# Patient Record
Sex: Male | Born: 1955 | Race: Black or African American | Hispanic: No | Marital: Single | Smoking: Current every day smoker
Health system: Southern US, Community
[De-identification: ages and names within clinical notes are randomized; demographics above are authoritative.]

## PROBLEM LIST (undated history)

## (undated) DIAGNOSIS — I219 Acute myocardial infarction, unspecified: Secondary | ICD-10-CM

## (undated) DIAGNOSIS — I5020 Unspecified systolic (congestive) heart failure: Secondary | ICD-10-CM

## (undated) DIAGNOSIS — B1910 Unspecified viral hepatitis B without hepatic coma: Secondary | ICD-10-CM

## (undated) DIAGNOSIS — Z720 Tobacco use: Secondary | ICD-10-CM

## (undated) DIAGNOSIS — I509 Heart failure, unspecified: Secondary | ICD-10-CM

## (undated) DIAGNOSIS — F32A Depression, unspecified: Secondary | ICD-10-CM

## (undated) DIAGNOSIS — I2510 Atherosclerotic heart disease of native coronary artery without angina pectoris: Secondary | ICD-10-CM

## (undated) DIAGNOSIS — R519 Headache, unspecified: Secondary | ICD-10-CM

## (undated) DIAGNOSIS — I10 Essential (primary) hypertension: Secondary | ICD-10-CM

## (undated) DIAGNOSIS — R51 Headache: Secondary | ICD-10-CM

## (undated) DIAGNOSIS — J449 Chronic obstructive pulmonary disease, unspecified: Secondary | ICD-10-CM

## (undated) DIAGNOSIS — B1920 Unspecified viral hepatitis C without hepatic coma: Secondary | ICD-10-CM

## (undated) DIAGNOSIS — E785 Hyperlipidemia, unspecified: Secondary | ICD-10-CM

## (undated) DIAGNOSIS — F329 Major depressive disorder, single episode, unspecified: Secondary | ICD-10-CM

## (undated) HISTORY — PX: KNEE SURGERY: SHX244

## (undated) HISTORY — DX: Unspecified viral hepatitis C without hepatic coma: B19.20

## (undated) HISTORY — DX: Acute myocardial infarction, unspecified: I21.9

## (undated) HISTORY — DX: Atherosclerotic heart disease of native coronary artery without angina pectoris: I25.10

## (undated) HISTORY — DX: Tobacco use: Z72.0

## (undated) HISTORY — DX: Unspecified systolic (congestive) heart failure: I50.20

## (undated) HISTORY — DX: Hyperlipidemia, unspecified: E78.5

## (undated) HISTORY — DX: Essential (primary) hypertension: I10

## (undated) HISTORY — DX: Depression, unspecified: F32.A

## (undated) HISTORY — DX: Headache: R51

## (undated) HISTORY — DX: Chronic obstructive pulmonary disease, unspecified: J44.9

## (undated) HISTORY — DX: Major depressive disorder, single episode, unspecified: F32.9

## (undated) HISTORY — DX: Headache, unspecified: R51.9

## (undated) HISTORY — PX: CARDIAC CATHETERIZATION: SHX172

## (undated) HISTORY — DX: Unspecified viral hepatitis B without hepatic coma: B19.10

---

## 2007-01-16 ENCOUNTER — Other Ambulatory Visit: Payer: Self-pay

## 2007-01-16 ENCOUNTER — Emergency Department: Payer: Self-pay

## 2007-07-11 HISTORY — PX: CORONARY ANGIOPLASTY WITH STENT PLACEMENT: SHX49

## 2008-05-05 ENCOUNTER — Ambulatory Visit: Payer: Self-pay | Admitting: Cardiovascular Disease

## 2008-05-30 ENCOUNTER — Emergency Department: Payer: Self-pay | Admitting: Emergency Medicine

## 2010-07-10 HISTORY — PX: CORONARY ARTERY BYPASS GRAFT: SHX141

## 2011-01-26 DIAGNOSIS — F99 Mental disorder, not otherwise specified: Secondary | ICD-10-CM | POA: Insufficient documentation

## 2011-01-26 DIAGNOSIS — B1920 Unspecified viral hepatitis C without hepatic coma: Secondary | ICD-10-CM | POA: Insufficient documentation

## 2011-01-26 DIAGNOSIS — B182 Chronic viral hepatitis C: Secondary | ICD-10-CM | POA: Insufficient documentation

## 2014-09-16 ENCOUNTER — Ambulatory Visit (INDEPENDENT_AMBULATORY_CARE_PROVIDER_SITE_OTHER): Payer: Medicaid Other | Admitting: Cardiovascular Disease

## 2014-09-16 ENCOUNTER — Encounter: Payer: Self-pay | Admitting: Cardiovascular Disease

## 2014-09-16 VITALS — BP 122/80 | HR 70 | Ht 67.0 in | Wt 164.8 lb

## 2014-09-16 DIAGNOSIS — K759 Inflammatory liver disease, unspecified: Secondary | ICD-10-CM | POA: Insufficient documentation

## 2014-09-16 DIAGNOSIS — I5022 Chronic systolic (congestive) heart failure: Secondary | ICD-10-CM | POA: Insufficient documentation

## 2014-09-16 DIAGNOSIS — J439 Emphysema, unspecified: Secondary | ICD-10-CM

## 2014-09-16 DIAGNOSIS — F17200 Nicotine dependence, unspecified, uncomplicated: Secondary | ICD-10-CM | POA: Insufficient documentation

## 2014-09-16 DIAGNOSIS — R0602 Shortness of breath: Secondary | ICD-10-CM | POA: Insufficient documentation

## 2014-09-16 DIAGNOSIS — I25119 Atherosclerotic heart disease of native coronary artery with unspecified angina pectoris: Secondary | ICD-10-CM | POA: Insufficient documentation

## 2014-09-16 DIAGNOSIS — R0789 Other chest pain: Secondary | ICD-10-CM

## 2014-09-16 DIAGNOSIS — Z951 Presence of aortocoronary bypass graft: Secondary | ICD-10-CM | POA: Insufficient documentation

## 2014-09-16 DIAGNOSIS — J432 Centrilobular emphysema: Secondary | ICD-10-CM | POA: Insufficient documentation

## 2014-09-16 DIAGNOSIS — Z720 Tobacco use: Secondary | ICD-10-CM | POA: Insufficient documentation

## 2014-09-16 DIAGNOSIS — I25118 Atherosclerotic heart disease of native coronary artery with other forms of angina pectoris: Secondary | ICD-10-CM | POA: Insufficient documentation

## 2014-09-16 MED ORDER — FUROSEMIDE 20 MG PO TABS: 20.0000 mg | ORAL_TABLET | Freq: Every day | ORAL | Status: DC | PRN

## 2014-09-16 MED ORDER — ALBUTEROL SULFATE HFA 108 (90 BASE) MCG/ACT IN AERS: 2.0000 | INHALATION_SPRAY | RESPIRATORY_TRACT | Status: DC | PRN

## 2014-09-16 NOTE — Assessment & Plan Note (Signed)
Records [facility] from 2012

## 2014-09-16 NOTE — Assessment & Plan Note (Signed)
Recommended smoking cessation. Likely severe COPD given 40+ years of smoking We have given him an inhaler/albuterol to take as needed for shortness of breath symptoms

## 2014-09-16 NOTE — Progress Notes (Signed)
Patient ID: Blake [name], male    DOB: June 21, 1956, 59 y.o.   MRN: [id number]  HPI Comments: Mr. Blake Blake [name] is a 59 year old gentleman with long history of smoking who continues to smoke at least one pack per day, hypertension, hepatitis, schizophrenia, coronary artery disease, cardiac catheterization October 2009 showing ejection fraction 32%, severe anterolateral and severe apical hypokinesis, three-vessel coronary artery disease, bypass surgery after MI in 2012 (details unavailable and have been requested), who presents for worsening shortness of breath. Notes indicate he was in prison for 2 years and had no medications, May 2015 got out of prison.  He reports that he was doing well following bypass surgery in 2012. Approximately 2-3 months ago he developed worsening shortness of breath. He has had some lower extremity edema. Family presents with him today and reports that he is unable to walk very far, has significant wheezing and shortness of breath with any activity. Denies having regular chest pain/anginal symptoms. He does report having occasional chest pain, takes nitroglycerin when necessary. Until recently was not taking any aspirin Recently seen by primary care  Recent blood work reviewed with him showing total cholesterol 126, LDL 82, HDL 33 Normal creatinine 0.92, normal LFTs, PSA 0.7 Hepatitis C virus antibody positive, hepatitis B core antibody positive,  EKG on today's visit shows normal sinus rhythm with rate 70 bpm, old anterior MI, T wave abnormality V3 through V6, 1 and aVL   No Known Allergies  Outpatient Encounter Prescriptions as of 09/16/2014  Medication Sig  . albuterol (PROVENTIL HFA;VENTOLIN HFA) 108 (90 BASE) MCG/ACT inhaler Inhale 2 puffs into the lungs every 4 (four) hours as needed for wheezing or shortness of breath.  Marland Kitchen. aspirin 81 MG chewable tablet Chew 81 mg by mouth daily.   . furosemide (LASIX) 20 MG tablet Take 1 tablet (20 mg total) by mouth  daily as needed.  Marland Kitchen. lisinopril (PRINIVIL,ZESTRIL) 10 MG tablet Take 10 mg by mouth daily.   Marland Kitchen. loratadine (CLARITIN) 10 MG tablet Take 10 mg by mouth daily.   . nitroGLYCERIN (NITROSTAT) 0.4 MG SL tablet Place 0.4 mg under the tongue.     Past Medical History  Diagnosis Date  . Coronary artery disease   . Depression   . Acute headache   . Hypertension   . Hyperlipidemia   . MI (myocardial infarction)     x 2    Past Surgical History  Procedure Laterality Date  . Knee surgery      right knee   . Cardiac catheterization    . Coronary angioplasty with stent placement  2009  . Coronary artery bypass graft  2012    [facility]    Social History  reports that he has been smoking Cigarettes.  He has a 42 pack-year smoking history. He does not have any smokeless tobacco history on file. He reports that he does not drink alcohol or use illicit drugs.  Family History family history includes Hypertension in his father, mother, sister, and sister.   Review of Systems  Constitutional: Negative.   HENT: Negative.   Respiratory: Positive for shortness of breath.   Cardiovascular: Positive for chest pain.  Gastrointestinal: Negative.   Musculoskeletal: Negative.   Skin: Negative.   Neurological: Negative.   Hematological: Negative.   Psychiatric/Behavioral: Negative.   All other systems reviewed and are negative.   BP 122/80 mmHg  Pulse 70  Ht 5\' 7"  (1.702 m)  Wt 164 lb 12 oz (74.73 kg)  BMI 25.80 kg/m2  Physical Exam  Constitutional: He is oriented to person, place, and time. He appears well-developed and well-nourished.  HENT:  Head: Normocephalic.  Nose: Nose normal.  Mouth/Throat: Oropharynx is clear and moist.  Eyes: Conjunctivae are normal. Pupils are equal, round, and reactive to light.  Neck: Normal range of motion. Neck supple. No JVD present.  Cardiovascular: Normal rate, regular rhythm, S1 normal, S2 normal, normal heart sounds and intact distal pulses.  Exam reveals  no gallop and no friction rub.   No murmur heard. Pulmonary/Chest: Effort normal and breath sounds normal. No respiratory distress. He has no wheezes. He has no rales. He exhibits no tenderness.  Abdominal: Soft. Bowel sounds are normal. He exhibits no distension. There is no tenderness.  Musculoskeletal: Normal range of motion. He exhibits no edema or tenderness.  Lymphadenopathy:    He has no cervical adenopathy.  Neurological: He is alert and oriented to person, place, and time. Coordination normal.  Skin: Skin is warm and dry. No rash noted. No erythema.  Psychiatric: He has a normal mood and affect. His behavior is normal. Judgment and thought content normal.      Assessment and Plan   Nursing note and vitals reviewed.

## 2014-09-16 NOTE — Assessment & Plan Note (Signed)
Shortness of breath symptoms likely multifactorial though high on the list is severe coronary artery disease, ischemic cardiopathy, emphysema He does not want catheterization at this time and will call us if he would like to schedule this or stress testing

## 2014-09-16 NOTE — Patient Instructions (Addendum)
Try albuterol inhaler as needed for shortness of breath  Try lasix/furosemide (diuretic/pee pill) for three days then as needed for shortness of breath or leg swelling  If you have no significant improvement in your breathing, Call the office for a cardiac cath  Please call us if you have new issues that need to be addressed before your next appt.  Your physician wants you to follow-up in:1 month  You will receive a reminder letter in the mail two months in advance. If you don't receive a letter, please call our office to schedule the follow-up appointment.

## 2014-09-16 NOTE — Assessment & Plan Note (Signed)
We have encouraged him to continue to work on weaning his cigarettes and smoking cessation. He will continue to work on this and does not want any assistance with chantix.  

## 2014-09-16 NOTE — Assessment & Plan Note (Signed)
[facility]. Ejection fraction in the 30s by cardiac catheterization in 2009. We did suggest repeat echocardiogram. He will call us back to schedule. Given worsening lower extremity edema, worsening shortness of breath, we have provided Lasix 20 mg daily. Suggested he use this for leg edema, abdominal bloating, shortness of breath symptoms

## 2014-09-16 NOTE — Assessment & Plan Note (Signed)
Appears to be hepatitis A, B, C antibody positive. Will likely need additional hepatitis serologies to determine active infection

## 2014-09-16 NOTE — Assessment & Plan Note (Signed)
Known severe coronary artery disease, continued smoking Recommended he have cardiac catheterization given worsening shortness of breath in the past 2-3 months. We also offered stress testing. He would like to think about this. We did discuss echocardiogram. He prefers to try medications at this time.

## 2014-09-16 NOTE — Assessment & Plan Note (Signed)
Some chest pain. He does have nitroglycerin. Unable to start isosorbide given low blood pressure. We'll continue nitroglycerin when necessary. Recommended cardiac catheterization

## 2014-10-21 ENCOUNTER — Ambulatory Visit (INDEPENDENT_AMBULATORY_CARE_PROVIDER_SITE_OTHER): Payer: Medicaid Other | Admitting: Cardiovascular Disease

## 2014-10-21 ENCOUNTER — Encounter: Payer: Self-pay | Admitting: Cardiovascular Disease

## 2014-10-21 ENCOUNTER — Ambulatory Visit
Admit: 2014-10-21 | Disposition: A | Discharge status: Home / Self Care | Payer: Self-pay | Attending: Cardiovascular Disease | Admitting: Cardiovascular Disease

## 2014-10-21 ENCOUNTER — Other Ambulatory Visit: Payer: Self-pay

## 2014-10-21 VITALS — BP 134/78 | HR 66 | Ht 66.0 in | Wt 167.0 lb

## 2014-10-21 DIAGNOSIS — Z01812 Encounter for preprocedural laboratory examination: Secondary | ICD-10-CM

## 2014-10-21 DIAGNOSIS — R0602 Shortness of breath: Secondary | ICD-10-CM | POA: Diagnosis not present

## 2014-10-21 DIAGNOSIS — Z720 Tobacco use: Secondary | ICD-10-CM

## 2014-10-21 DIAGNOSIS — Z951 Presence of aortocoronary bypass graft: Secondary | ICD-10-CM | POA: Diagnosis not present

## 2014-10-21 DIAGNOSIS — K759 Inflammatory liver disease, unspecified: Secondary | ICD-10-CM

## 2014-10-21 DIAGNOSIS — I5022 Chronic systolic (congestive) heart failure: Secondary | ICD-10-CM

## 2014-10-21 DIAGNOSIS — F17200 Nicotine dependence, unspecified, uncomplicated: Secondary | ICD-10-CM

## 2014-10-21 LAB — CBC WITH DIFFERENTIAL/PLATELET
Basophil #: 0.1 10*3/uL (ref 0.0–0.1)
Basophil %: 1.2 %
EOS ABS: 0.1 10*3/uL (ref 0.0–0.7)
Eosinophil %: 2.1 %
HCT: 44.2 % (ref 40.0–52.0)
HGB: 14.6 g/dL (ref 13.0–18.0)
Lymphocyte #: 1.5 10*3/uL (ref 1.0–3.6)
Lymphocyte %: 32.2 %
MCH: 29.9 pg (ref 26.0–34.0)
MCHC: 32.9 g/dL (ref 32.0–36.0)
MCV: 91 fL (ref 80–100)
MONO ABS: 0.5 x10 3/mm (ref 0.2–1.0)
Monocyte %: 11.2 %
Neutrophil #: 2.4 10*3/uL (ref 1.4–6.5)
Neutrophil %: 53.3 %
PLATELETS: 197 10*3/uL (ref 150–440)
RBC: 4.87 10*6/uL (ref 4.40–5.90)
RDW: 13.4 % (ref 11.5–14.5)
WBC: 4.6 10*3/uL (ref 3.8–10.6)

## 2014-10-21 LAB — BASIC METABOLIC PANEL
Anion Gap: 4 — ABNORMAL LOW (ref 7–16)
BUN: 12 mg/dL
Calcium, Total: 8.9 mg/dL
Chloride: 105 mmol/L
Co2: 29 mmol/L
Creatinine: 1.06 mg/dL
Glucose: 79 mg/dL
Potassium: 4 mmol/L
SODIUM: 138 mmol/L

## 2014-10-21 LAB — PROTIME-INR
INR: 1.1
PROTHROMBIN TIME: 14.6 s

## 2014-10-21 NOTE — Assessment & Plan Note (Signed)
We will try to obtain prior surgical report of his CABG  In preparation for catheterization

## 2014-10-21 NOTE — Progress Notes (Signed)
Patient ID: Blake [name], male    DOB: February 24, 1956, 59 y.o.   MRN: [id number]  HPI Comments: Mr. Blake Blake [name] is a 59 year old gentleman with long history of smoking who continues to smoke at least one pack per day, hypertension, hepatitis, schizophrenia, coronary artery disease, cardiac catheterization October 2009 showing ejection fraction 32%, severe anterolateral and severe apical hypokinesis, three-vessel coronary artery disease, bypass surgery after MI in 2012 (details unavailable), who presents for worsening shortness of breath. Notes indicate he was in prison for 2 years and had no medications, May 2015 got out of prison.  In follow-up today, he reports that he is been taking Lasix daily for continues to have shortness of breath. He and his wife her concern about blockages. On his last clinic visit, we did discuss doing stress test or catheterization. He declined at that time. Symptoms have continued to persist, chest tightness, breathing problem. He would like catheterization. He continues to smoke one pack per day.  Limited in what he can do secondary to his symptoms. No lower extremity edema.  EKG on today's visit shows normal sinus rhythm with rate 66 bpm, T-wave abnormality anterolateral leads, V2 through V6, 1 and aVL, old anterior MI  Other past medical history He reports that he was doing well following bypass surgery in 2012. Beginning of 2016 he developed worsening shortness of breath. He has had some lower extremity edema.  He does report having occasional chest pain, takes nitroglycerin when necessary.   Recent blood work reviewed with him showing total cholesterol 126, LDL 82, HDL 33 Normal creatinine 0.92, normal LFTs, PSA 0.7 Hepatitis C virus antibody positive, hepatitis B core antibody positive,    No Known Allergies  Outpatient Encounter Prescriptions as of 10/21/2014  Medication Sig  . albuterol (PROVENTIL HFA;VENTOLIN HFA) 108 (90 BASE) MCG/ACT inhaler  Inhale 2 puffs into the lungs every 4 (four) hours as needed for wheezing or shortness of breath.  Marland Kitchen. aspirin 81 MG chewable tablet Chew 81 mg by mouth daily.   . furosemide (LASIX) 20 MG tablet Take 1 tablet (20 mg total) by mouth daily as needed.  Marland Kitchen. lisinopril (PRINIVIL,ZESTRIL) 10 MG tablet Take 10 mg by mouth daily.   Marland Kitchen. loratadine (CLARITIN) 10 MG tablet Take 10 mg by mouth daily.   . nitroGLYCERIN (NITROSTAT) 0.4 MG SL tablet Place 0.4 mg under the tongue.     Past Medical History  Diagnosis Date  . Coronary artery disease   . Depression   . Acute headache   . Hypertension   . Hyperlipidemia   . MI (myocardial infarction)     x 2    Past Surgical History  Procedure Laterality Date  . Knee surgery      right knee   . Cardiac catheterization    . Coronary angioplasty with stent placement  2009  . Coronary artery bypass graft  2012    [facility]    Social History  reports that he has been smoking Cigarettes.  He has a 42 pack-year smoking history. He does not have any smokeless tobacco history on file. He reports that he does not drink alcohol or use illicit drugs.  Family History family history includes Hypertension in his father, mother, sister, and sister.   Review of Systems  Constitutional: Negative.   Respiratory: Positive for shortness of breath.   Cardiovascular: Positive for chest pain.  Gastrointestinal: Negative.   Musculoskeletal: Negative.   Skin: Negative.   Neurological: Negative.   Hematological: Negative.  Psychiatric/Behavioral: Negative.   All other systems reviewed and are negative.   BP 134/78 mmHg  Pulse 66  Ht  (1.676 m)  Wt 167 lb (75.751 kg)  BMI 26.97 kg/m2  Physical Exam  Constitutional: He is oriented to person, place, and time. He appears well-developed and well-nourished.  HENT:  Head: Normocephalic.  Nose: Nose normal.  Mouth/Throat: Oropharynx is clear and moist.  Eyes: Conjunctivae are normal. Pupils are equal, round, and  reactive to light.  Neck: Normal range of motion. Neck supple. No JVD present.  Cardiovascular: Normal rate, regular rhythm, S1 normal, S2 normal, normal heart sounds and intact distal pulses.  Exam reveals no gallop and no friction rub.   No murmur heard. Pulmonary/Chest: Effort normal and breath sounds normal. No respiratory distress. He has no wheezes. He has no rales. He exhibits no tenderness.  Abdominal: Soft. Bowel sounds are normal. He exhibits no distension. There is no tenderness.  Musculoskeletal: Normal range of motion. He exhibits no edema or tenderness.  Lymphadenopathy:    He has no cervical adenopathy.  Neurological: He is alert and oriented to person, place, and time. Coordination normal.  Skin: Skin is warm and dry. No rash noted. No erythema.  Psychiatric: He has a normal mood and affect. His behavior is normal. Judgment and thought content normal.      Assessment and Plan   Nursing note and vitals reviewed.

## 2014-10-21 NOTE — Patient Instructions (Addendum)
  No medication changes were made.  We will schedule a cardiac cath for your shortness of breath:  [facility] Cardiac Cath Instructions   You are scheduled for a Cardiac Cath on:____Monday, April 18______  Please arrive at __9:30 am__ on the day of your procedure  You will need to pre-register prior to the day of your procedure.  [facility].  Registration is the first desk on your right.  Please take the procedure order we have given you in order to be registered appropriately  Do not eat/drink anything after midnight  Someone will need to drive you home  It is recommended someone be with you for the first 24 hours after your procedure  Wear clothes that are easy to get on/off and wear slip on shoes if possible   Medications bring a current list of all medications with you  _X_ You may take all of your medications the morning of your procedure with enough water to swallow safely.  Day of your procedure: [facility] at the [facility] entrance.  Free valet service is available.  After entering the Medical Mall please check-in at the registration desk (1st desk on your right) to receive your armband. After receiving your armband someone will escort you to the cardiac cath/special procedures waiting area.  The usual length of stay after your procedure is about 2 to 3 hours.  This can vary.  If you have any questions, please call our office at [phone], or you may call the cardiac cath lab at [facility] directly at [phone]  Please call us if you have new issues that need to be addressed before your next appt.  Your physician wants you to follow-up in: 1 month.

## 2014-10-21 NOTE — Assessment & Plan Note (Addendum)
Worsening shortness of breath through 2016. Concerning for ischemia given his prior coronary disease history, history of bypass and continued smoking. We have discussed his various treatment options with him. He prefers cardiac catheterization. Risk and benefit was discussed with him. Orders placed for catheterization next week.  lab work drawn today, chest x-ray today in preparation

## 2014-10-21 NOTE — Assessment & Plan Note (Signed)
We have encouraged her to continue to work on weaning her cigarettes and smoking cessation. She will continue to work on this and does not want any assistance with chantix.  

## 2014-10-21 NOTE — Assessment & Plan Note (Signed)
Managed by primary care. 

## 2014-10-21 NOTE — Assessment & Plan Note (Signed)
Encouraged him to stay on Lasix 20 mg daily. Appears euvolemic. BMP ordered for today

## 2014-10-26 ENCOUNTER — Ambulatory Visit
Admit: 2014-10-26 | Disposition: A | Discharge status: Home / Self Care | Payer: Self-pay | Attending: Cardiovascular Disease | Admitting: Cardiovascular Disease

## 2014-10-26 DIAGNOSIS — I25110 Atherosclerotic heart disease of native coronary artery with unstable angina pectoris: Secondary | ICD-10-CM | POA: Diagnosis not present

## 2014-10-26 NOTE — Telephone Encounter (Signed)
This encounter was created in error - please disregard.

## 2014-10-27 ENCOUNTER — Encounter: Payer: Self-pay | Admitting: Cardiovascular Disease

## 2014-10-27 ENCOUNTER — Other Ambulatory Visit: Payer: Self-pay | Admitting: Physician Assistant

## 2014-10-27 DIAGNOSIS — I2510 Atherosclerotic heart disease of native coronary artery without angina pectoris: Secondary | ICD-10-CM

## 2014-10-27 DIAGNOSIS — I25110 Atherosclerotic heart disease of native coronary artery with unstable angina pectoris: Secondary | ICD-10-CM | POA: Diagnosis not present

## 2014-10-27 LAB — BASIC METABOLIC PANEL
ANION GAP: 3 — AB (ref 7–16)
BUN: 16 mg/dL
CO2: 25 mmol/L
Calcium, Total: 8.1 mg/dL — ABNORMAL LOW
Chloride: 109 mmol/L
Creatinine: 0.91 mg/dL
EGFR (African American): >60
GLUCOSE: 160 mg/dL — AB
Potassium: 3.6 mmol/L
SODIUM: 137 mmol/L

## 2014-10-27 LAB — CK-MB: CK-MB: 3.2 ng/mL

## 2014-10-27 MED ORDER — CLOPIDOGREL BISULFATE 75 MG PO TABS: 75.0000 mg | ORAL_TABLET | Freq: Every day | ORAL | Status: DC

## 2014-10-27 MED ORDER — ASPIRIN 81 MG PO CHEW: 81.0000 mg | CHEWABLE_TABLET | Freq: Every day | ORAL | Status: DC

## 2014-10-27 MED ORDER — METOPROLOL SUCCINATE ER 50 MG PO TB24: 50.0000 mg | ORAL_TABLET | Freq: Every day | ORAL | Status: DC

## 2014-10-28 ENCOUNTER — Other Ambulatory Visit: Payer: Self-pay

## 2014-10-28 DIAGNOSIS — R0602 Shortness of breath: Secondary | ICD-10-CM

## 2014-10-28 DIAGNOSIS — Z01812 Encounter for preprocedural laboratory examination: Secondary | ICD-10-CM

## 2014-11-08 NOTE — Discharge Summary (Signed)
Dates of Admission and Diagnosis:  Date of Admission 26-Oct-2014   Date of Discharge 27-Oct-2014   Admitting Diagnosis Worsening SOB and [place]   Final Diagnosis CAD with history of remote CABG s/p PCI/DES to mid LCx on 10/26/2014   Discharge Diagnosis 1 Chronic systolic CHF   2 Ongoing tobacco abuse   3 Hepatitis    Chief Complaint/History of Present Illness 59 year old male with history of CAD s/p CABG in the setting of MI in 2012 (details unavailable), with long history of continued smoking who continues to smoke at least one pack per day, history of chronic systolic CHF (EF 65% in 0354 by cardiac cath that also showed 3 vessel disease, severe anterolateral apical HK), hypertension, hepatitis, and schizophrenia.   He was previously incarcerated for 2 years and released May 2015. He had no medications during that time. He recently saw Dr. Rockey Situ 10/21/2014 for increased dyspnea that began around the beginning of 2016. He reported he had been taking Lasix daily but continued to have SOB. Both he and his wife were concerned for potential blockages. On his last clinic visit, we did discuss doing stress test or catheterization. He declined at that time. His symptoms have continued to persist, chest tightness, increased dyspnea. He would like cardiac catheterization at this time. He continues to smoke one pack per day. He is limited in what he can do secondary to his symptoms. No lower extremity edema currently though he has had some intermittent LEE. Having to take intermittent SL NTG.  EKG at his visit on 10/21/2014 showed normal sinus rhythm with rate 66 bpm, T-wave abnormality anterolateral leads, V2 through V6, 1 and aVL, old anterior MI.  Recent blood work showed total cholesterol 126, LDL 82, HDL 33 Normal creatinine 0.92, normal LFTs, PSA 0.7 Hepatitis C virus antibody positive, hepatitis B core antibody positive  He was scheduled for an outpatient cardiac cath on 10/26/2014.    Allergies:  No Known Allergies:   Cardiac Catherization:  18-Apr-16 10:30   Cardiac Catheterization  --------------------Diagnostic Report-------------------- [facility] [place], [place] [id number] [phone]   Cardiovascular Catheterization Diagnostic Report   Patient: Blake [name] Study date: 10/26/2014 MRnumber: [id number] Account number: [id number]   DOB: 23-Jun-1956 Age: 11 years Gender: Male Race: Black Height: Weight:   SUMMARY:   -Summary: Occluded SVG to the OM and SVG to the PDA. Occluded native RCA (collaterals from left to right). Patent LIMA to the LAD Patent SVG to the diagonal vessel. Severe/critical disease of the mid LCX vessel estimated at 95% at the take off of the AV grove circumflex.   Case discussed with Dr. Fletcher Anon. He will attempt PCI on the mid LCX   CORONARY CIRCULATION: Mid circumflex: There was a tubular 95 % stenosis. 2nd obtuse marginal: There was a tubular 95 % stenosis at the ostium of the vessel segment. RCA: The vessel was small sized. Proximal RCA: There was a 100 % stenosis. This lesion is a chronic total occlusion. Graft to the LAD: The graft was a large sized LIMA from the aorta. Graft to the 1st diagonal: The graft was a large sized saphenous vein graft from the ascending aorta. Graft to the 2nd obtuse marginal: The graft was a saphenous vein graft from the ascending aorta. There was a 100 % stenosis at the graft ostium. Graft to the RPDA: The graft was a saphenous vein graft from the ascending aorta. There was a 100 % stenosis at the graft ostium.  PROCEDURE: The risks andalternatives of the procedures and conscious sedation were explained to the patient and informed consent was obtained. The patient was brought to the cath lab and placed on the table. The planned puncture sites were prepped and draped in the usual sterile fashion.   Prepared and signed by   Minna Merritts, MD Signed  10/26/2014 12:10:51   STUDY DIAGRAM   Angiographic findings Native coronary lesions:  Mid circumflex: Lesion 1: tubular, 95 % stenosis.  OM2: Lesion 1: tubular, 95 % stenosis.  Proximal RCA: Lesion 1: 100 % stenosis.   Coronary graft lesions: Graft to OM2: SVG from ascending aorta    100 % stenosis at graft ostium.   Graft to RPDA: SVG from ascending aorta    100 % stenosis at graft ostium.   HEMODYNAMIC TABLES - Diagnostic   --------------------Interventional Report-------------------- [facility] [place], [place] [address] [phone]   Cardiovascular Catheterization Interventional Report   Patient: Blake [name] Study date: 10/26/2014 MR number: [id number] Account number: [id number]   DOB: [birth year] Age: 7 years Gender: Male Race: Black Height: 66.1 in Weight: 166.5 lb   Diagnostic Cardiologist:  Minna Merritts, MD Interventional Cardiologist:  Kathlyn Sacramento, MD   SUMMARY:   -1ST LESION INTERVENTIONS: -A drug-eluting stent was performed on the 95 % lesion in the mid circumflex. Following intervention there was a 0 % residual stenosis. This was s true bifurcation lesion at the origin of the AV groove artery. both branches were wired. After placing the stent in the mid LCX, there was residual 95% stenosis in ostial AV groove LCX. Thus, this was rewired via the stent struts with some difficulty. I could not pass an [place] balloon. Thus, I used a 2.5 X8 mm balloon and performed angioplasty for 1 minute. The stent was thus postdilated with a 3.5 [place] balloon with excellent results and 0% residual stenosis. There was 60% residual stenosis in the AV groove with normal flow.   -Summary: Occluded SVG to the OM and SVG to the PDA. Occluded native RCA (collaterals from left to right). Patent LIMA to the LAD Patent SVG to the diagonal vessel. Severe/critical disease of the mid LCX vessel estimated at 95% at the take off of the AV grove  circumflex.   Case discussed with Dr. Fletcher Anon. He will attempt PCI on the mid LCX   Procedures performed: Intervention on mid circumflex: drug-eluting stent.   INDICATIONS: Angina/MI: unstable angina.   HISTORY: The patient has hypertension, a history of current cigarette use, and a family history of coronary artery disease. There was no history of congestive heart failure, cardiogenic shock, cerebrovascular disease, peripheral arterial disease, diabetes, or cardiac arrest. PRIOR CARDIOVASCULAR PROCEDURES: No history of valve surgery.   PRIOR DIAGNOSTIC TEST RESULTS: No prior stress test is available.   PROCEDURE: The risks and alternatives of the procedures and conscious sedation were explained to the patient and informed consent was obtained. The patient was brought to the cath lab and placed on the table. The planned puncture sites were prepped and draped in the usual sterile fashion.   LESION INTERVENTION: A drug-eluting stent was performed on the 95 % lesion in the mid circumflex. Following intervention there was a 0 % residual stenosis. This was s true bifurcation lesion at the origin of the AV groove artery. both branches were wired. After placing the stent in the mid LCX, there was residual 95% stenosis in ostial AV groove LCX. Thus, this was rewired via the stent  struts with some difficulty. I could not pass an [place] balloon. Thus, I used a 2.5 X8 mm balloon and performed angioplasty for 1 minute. The stent was thus postdilated with a 3.5 [place] balloon with excellent results and 0% residual stenosis. There was 60% residual stenosis in the AV groove with normal flow. This was an ACC/AHA "non-high risk" lesion for intervention. There was TIMI 3 flow before the procedure and TIMI 3 flow after the procedure. There was no acute vessel closure. There was no perforation. There was no dissection.   -Vessel setup was performed. A 46F XB 3.5 guiding catheter was used to intubate the  vessel.   -Vessel setup was performed. A [place] NS 180cm wire was used to cross the lesion.   -Vessel setup was performed. A Intuition steerable Guidewire wire was used to cross the lesion.   -Balloon angioplasty was performed, using a Rx Trek 2.5 x 19m balloon, with 1 inflation(s)and a maximum inflation pressure of 10 atm.   -Vessel setup was performed. A Intuition steerable Guidewire wire was used to cross the lesion.   -A Xience Alpine 3.00 x 146mdrug-eluting stent was placed across the lesion and deployed at a maximum inflation pressure of 14 atm.   -Balloon angioplasty was performed, using a Rx Trek 2.5 x 14m514malloon, with 1 inflation(s) and a maximum inflation pressure of 12 atm.   -Vessel setup was performed. A Intuition steerable Guidewire wire was used to cross the lesion.   -Balloon angioplasty was performed, using a [place] Trek 3.5 x 8m53mlloon, with 1 inflation(s) and a maximum inflation pressure of 14 atm.   PROCEDURE COMPLETION: TIMING: Test started at 10:54. Test concluded at 12:41. MEDICATIONS GIVEN: Nitroglycerin, 300 mcg, intracoronary, at 12:38. Aspirin, 324 mg, PO, last dose at 12:07. Clopidogrel (Plavix), 600 mg, PO, last dose at 12:09. Angiomax Bolus, 11.25 ml, IV, at 12:05. Angiomax Drip, infusion rate of 26.2 ml/hr, IV, at 12:06.   Prepared and signed by   MuhaKathlyn Sacramento Signed 10/26/2014 12:53:18   STUDY DIAGRAM   Angiographic findings Native coronary lesions:  Mid circumflex: Lesion 1: tubular, 95 % stenosis.  OM2: Lesion 1: tubular, 95 % stenosis.  Proximal RCA: Lesion 1: 100 % stenosis.   Coronary graft lesions: Graft to OM2: SVG from ascending aorta    100 % stenosis at graft ostium.   Graft to RPDA: SVG from ascending aorta    100 % stenosis at graft ostium.   Intervention results Native coronary lesions: drug-eluting stent of the 95 % stenosis in mid circumflex. 0 % residual stenosis. Stent: Xience Alpine 3.00 x 114mm314mug-eluting.  Routine Chem:  19-Apr-16 05:59   Glucose, Serum  160 (65-99 NOTE: New Reference Range  09/15/14)  BUN 16 (6-20 NOTE: New Reference Range  09/15/14)  Creatinine (comp) 0.91 (0.61-1.24 NOTE: New Reference Range  09/15/14)  Sodium, Serum 137 (135-145 NOTE: New Reference Range  09/15/14)  Potassium, Serum 3.6 (3.5-5.1 NOTE: New Reference Range  09/15/14)  Chloride, Serum 109 (101-111 NOTE: New Reference Range  09/15/14)  CO2, Serum 25 (22-32 NOTE: New Reference Range  09/15/14)  Calcium (Total), Serum  8.1 (8.9-10.3 NOTE: New Reference Range  09/15/14)  Anion Gap  3  eGFR (African American) >60  eGFR (Non-African American) >60 (eGFR values <60mL/49m1.73 m2 may be an indication of chronic kidney disease (CKD). Calculated eGFR is useful in patients with stable renal function. The eGFR calculation will not be reliable in acutely ill patients when serum creatinine  is changing rapidly. It is not useful in patients on dialysis. The eGFR calculation may not be applicable to patients at the low and high extremes of body sizes, pregnant women, and vegetarians.)   PERTINENT RADIOLOGY STUDIES: XRay:    13-Apr-16 11:35, Chest PA and Lateral  Chest PA and Lateral   REASON FOR EXAM:    Sob, cath pre-cath  COMMENTS:       PROCEDURE: DXR - DXR CHEST PA (OR AP) AND LATERAL  - Oct 21 2014 11:35AM     CLINICAL DATA:  Shortness of breath. History of smoking and COPD.  Preop for cardiac catheterization.    EXAM:  CHEST 2 VIEW    COMPARISON:  05/30/2008    FINDINGS:  Stable surgical changes from triple bypass surgery. The cardiac  silhouette, mediastinal and hilar contours are normal. The lungs are  clear. No pleural effusion. The bony thorax is intact.     IMPRESSION:  No acute cardiopulmonary findings.      Electronically Signed    By: Marijo Sanes M.D.    On: 10/21/2014 15:43         Verified By: Marlane Hatcher, M.D.,   Pertinent Past History:   Pertinent Past History See above.   Hospital Course:  Hospital Course He underwent outpatient cardiac cath on 10/26/2014 that showed: occluded SVG to the OM and SVG to the PDA. Occluded native RCA (collaterals from left to right). Patent LIMA to the LAD. Patent SVG to the diagonal vessel. Severe/critical disease of the mid LCX vessel estimated at 95% at the take off of the AV grove circumflex s/p PCI/DES to mid LCx. He was placed on DAPT (Plavix 75 mg daily and aspirin 81 mg daily) which he will need to take for at least 12 months. He was continued on lisinopril 10 mg, Toprol XL 50 mg, and SL NTG prn. Case was discussed with case manager in detail to be certain he would have DAPT and this was verified. Prescription was e-scribed to his pharmacy on file in [place] and prescription was printed off for him to take as a back up. He was seen by Dr. Rockey Situ, MD and felt to be stable and in good condition to be discharged.   Condition on Discharge Stable   Code Status:  Code Status Full Code   DISCHARGE INSTRUCTIONS HOME MEDS:  Medication Reconciliation: Patient's Home Medications at Discharge:     Medication Instructions  metoprolol tablet 50 mg  1 tab(s) orally once a day (at bedtime)    lasix tablet 40 mg  1 tab(s) orally 2 times a day x 30 days    simvastatin tablet 40 mg  1 tab(s) orally once a day (at bedtime)    senna tablet 15 mg  2 tab(s) orally once a day (at bedtime)    oxycodone tablet 5 mg  1 tab(s) orally every 6 hours    aspir 81 enteric coated tablet 81 mg  1 tab(s) orally once a day    lisinopril 10 mg oral tablet  1 tab(s) orally once a day   loratadine 10 mg oral tablet  1 tab(s) orally once a day   nitroglycerin 0.4 mg sublingual tablet  1 tab(s) sublingual every 5 minutes   clopidogrel 75 mg oral tablet  1 tab(s) orally once a day    PRESCRIPTIONS: PRINTED AND PLACED ON CHART; ELECTRONICALLY SUBMITTED   Physician's Instructions:  Dressing Care Remove dressing tomorrow.  May  shower.   Diet Low  Fat, Low Cholesterol   Activity Limitations No heavy lifting  no lifting greater than 5 pounds for 1 week   Return to Work 1 week   Time frame for Follow Up Appointment 1-2 days     Nara Visa, Timothy(Attending Physician): [place], [address] [place], [place], [place] [address], [place]  TIME SPENT:  Total Time: Greater than 30 minutes   Electronic Signatures: Rise Mu (PA-C)   (Signed 19-Apr-16 16:20)  Authored: ADMISSION DATE AND DIAGNOSIS, CHIEF COMPLAINT/HPI, PERTINENT LABS, PERTINENT RADIOLOGY STUDIES, HOSPITAL COURSE, DISCHARGE INSTRUCTIONS HOME MEDS, PATIENT INSTRUCTIONS, Allergies, PERTINENT PAST HISTORY, Follow Up Physician, TIME SPENT Ida Rogue (MD)   (Signed 19-Apr-16 18:00)  Co-Signer: ADMISSION DATE AND DIAGNOSIS, CHIEF COMPLAINT/HPI, PERTINENT LABS, PERTINENT RADIOLOGY STUDIES, HOSPITAL COURSE, DISCHARGE INSTRUCTIONS HOME MEDS, PATIENT INSTRUCTIONS, Allergies, PERTINENT PAST HISTORY, Follow Up Physician, TIME SPENT  Last Updated: 19-Apr-16 18:00 by Ida Rogue (MD)

## 2014-11-20 ENCOUNTER — Encounter: Payer: Self-pay | Admitting: *Deleted

## 2014-11-20 ENCOUNTER — Ambulatory Visit: Payer: Medicaid Other | Admitting: Cardiovascular Disease

## 2015-06-01 ENCOUNTER — Encounter: Payer: Self-pay | Admitting: Emergency Medicine

## 2015-06-01 ENCOUNTER — Emergency Department
Admission: EM | Admit: 2015-06-01 | Discharge: 2015-06-01 | Disposition: A | Discharge status: Home / Self Care | Payer: Medicaid Other | Attending: Emergency Medicine | Admitting: Emergency Medicine

## 2015-06-01 DIAGNOSIS — Z23 Encounter for immunization: Secondary | ICD-10-CM | POA: Insufficient documentation

## 2015-06-01 DIAGNOSIS — Y9389 Activity, other specified: Secondary | ICD-10-CM | POA: Insufficient documentation

## 2015-06-01 DIAGNOSIS — Z7982 Long term (current) use of aspirin: Secondary | ICD-10-CM | POA: Insufficient documentation

## 2015-06-01 DIAGNOSIS — Y998 Other external cause status: Secondary | ICD-10-CM | POA: Insufficient documentation

## 2015-06-01 DIAGNOSIS — I10 Essential (primary) hypertension: Secondary | ICD-10-CM | POA: Insufficient documentation

## 2015-06-01 DIAGNOSIS — S61411A Laceration without foreign body of right hand, initial encounter: Secondary | ICD-10-CM | POA: Insufficient documentation

## 2015-06-01 DIAGNOSIS — Y9289 Other specified places as the place of occurrence of the external cause: Secondary | ICD-10-CM | POA: Diagnosis not present

## 2015-06-01 DIAGNOSIS — W241XXA Contact with transmission devices, not elsewhere classified, initial encounter: Secondary | ICD-10-CM | POA: Diagnosis not present

## 2015-06-01 DIAGNOSIS — Z79899 Other long term (current) drug therapy: Secondary | ICD-10-CM | POA: Insufficient documentation

## 2015-06-01 DIAGNOSIS — F17210 Nicotine dependence, cigarettes, uncomplicated: Secondary | ICD-10-CM | POA: Insufficient documentation

## 2015-06-01 MED ORDER — CEPHALEXIN 500 MG PO CAPS
500.0000 mg | ORAL_CAPSULE | Freq: Once | ORAL | Status: AC
  Administered 2015-06-01: 500 mg via ORAL
  Filled 2015-06-01: qty 1

## 2015-06-01 MED ORDER — LIDOCAINE-EPINEPHRINE 1 %-1:100000 IJ SOLN
1.3000 mL | Freq: Once | INTRAMUSCULAR | Status: DC
  Filled 2015-06-01: qty 1.3

## 2015-06-01 MED ORDER — LIDOCAINE-EPINEPHRINE (PF) 1 %-1:200000 IJ SOLN
INTRAMUSCULAR | Status: AC
  Filled 2015-06-01: qty 30

## 2015-06-01 MED ORDER — TETANUS-DIPHTH-ACELL PERTUSSIS 5-2.5-18.5 LF-MCG/0.5 IM SUSP
0.5000 mL | Freq: Once | INTRAMUSCULAR | Status: AC
  Administered 2015-06-01: 0.5 mL via INTRAMUSCULAR
  Filled 2015-06-01: qty 0.5

## 2015-06-01 MED ORDER — CEPHALEXIN 500 MG PO CAPS: 500.0000 mg | ORAL_CAPSULE | Freq: Four times a day (QID) | ORAL | Status: DC

## 2015-06-01 MED ORDER — LIDOCAINE-EPINEPHRINE (PF) 1 %-1:200000 IJ SOLN
INTRAMUSCULAR | Status: AC
  Administered 2015-06-01: 30 mL
  Filled 2015-06-01: qty 30

## 2015-06-01 NOTE — Discharge Instructions (Signed)
Laceration Care, Adult  A laceration is a cut that goes through all layers of the skin. The cut also goes into the tissue that is right under the skin. Some cuts heal on their own. Others need to be closed with stitches (sutures), staples, skin adhesive strips, or wound glue. Taking care of your cut lowers your risk of infection and helps your cut to heal better.  HOW TO TAKE CARE OF YOUR CUT  For stitches or staples:  · Keep the wound clean and dry.  · If you were given a bandage (dressing), you should change it at least one time per day or as told by your doctor. You should also change it if it gets wet or dirty.  · Keep the wound completely dry for the first 24 hours or as told by your doctor. After that time, you may take a shower or a bath. However, make sure that the wound is not soaked in water until after the stitches or staples have been removed.  · Clean the wound one time each day or as told by your doctor:    Wash the wound with soap and water.    Rinse the wound with water until all of the soap comes off.    Pat the wound dry with a clean towel. Do not rub the wound.  · After you clean the wound, put a thin layer of antibiotic ointment on it as told by your doctor. This ointment:    Helps to prevent infection.    Keeps the bandage from sticking to the wound.  · Have your stitches or staples removed as told by your doctor.  If your doctor used skin adhesive strips:   · Keep the wound clean and dry.  · If you were given a bandage, you should change it at least one time per day or as told by your doctor. You should also change it if it gets dirty or wet.  · Do not get the skin adhesive strips wet. You can take a shower or a bath, but be careful to keep the wound dry.  · If the wound gets wet, pat it dry with a clean towel. Do not rub the wound.  · Skin adhesive strips fall off on their own. You can trim the strips as the wound heals. Do not remove any strips that are still stuck to the wound. They will  fall off after a while.  If your doctor used wound glue:  · Try to keep your wound dry, but you may briefly wet it in the shower or bath. Do not soak the wound in water, such as by swimming.  · After you take a shower or a bath, gently pat the wound dry with a clean towel. Do not rub the wound.  · Do not do any activities that will make you really sweaty until the skin glue has fallen off on its own.  · Do not apply liquid, cream, or ointment medicine to your wound while the skin glue is still on.  · If you were given a bandage, you should change it at least one time per day or as told by your doctor. You should also change it if it gets dirty or wet.  · If a bandage is placed over the wound, do not let the tape for the bandage touch the skin glue.  · Do not pick at the glue. The skin glue usually stays on for 5-10 days. Then, it   falls off of the skin.  General Instructions   · To help prevent scarring, make sure to cover your wound with sunscreen whenever you are outside after stitches are removed, after adhesive strips are removed, or when wound glue stays in place and the wound is healed. Make sure to wear a sunscreen of at least 30 SPF.  · Take over-the-counter and prescription medicines only as told by your doctor.  · If you were given antibiotic medicine or ointment, take or apply it as told by your doctor. Do not stop using the antibiotic even if your wound is getting better.  · Do not scratch or pick at the wound.  · Keep all follow-up visits as told by your doctor. This is important.  · Check your wound every day for signs of infection. Watch for:    Redness, swelling, or pain.    Fluid, blood, or pus.  · Raise (elevate) the injured area above the level of your heart while you are sitting or lying down, if possible.  GET HELP IF:  · You got a tetanus shot and you have any of these problems at the injection site:    Swelling.    Very bad pain.    Redness.    Bleeding.  · You have a fever.  · A wound that was  closed breaks open.  · You notice a bad smell coming from your wound or your bandage.  · You notice something coming out of the wound, such as wood or glass.  · Medicine does not help your pain.  · You have more redness, swelling, or pain at the site of your wound.  · You have fluid, blood, or pus coming from your wound.  · You notice a change in the color of your skin near your wound.  · You need to change the bandage often because fluid, blood, or pus is coming from the wound.  · You start to have a new rash.  · You start to have numbness around the wound.  GET HELP RIGHT AWAY IF:  · You have very bad swelling around the wound.  · Your pain suddenly gets worse and is very bad.  · You notice painful lumps near the wound or on skin that is anywhere on your body.  · You have a red streak going away from your wound.  · The wound is on your hand or foot and you cannot move a finger or toe like you usually can.  · The wound is on your hand or foot and you notice that your fingers or toes look pale or bluish.     This information is not intended to replace advice given to you by your health care provider. Make sure you discuss any questions you have with your health care provider.     Document Released: 12/13/2007 Document Revised: 11/10/2014 Document Reviewed: 06/22/2014  Elsevier Interactive Patient Education ©2016 [employer]

## 2015-06-01 NOTE — ED Notes (Signed)
C/o laceration to right palm of his hand, states he cut hand on transmission

## 2015-06-01 NOTE — ED Provider Notes (Signed)
CSN: [id number]     Arrival date & time 06/01/15  1846 History   First MD Initiated Contact with Patient 06/01/15 1901     Chief Complaint  Patient presents with  . Laceration     (Consider location/radiation/quality/duration/timing/severity/associated sxs/prior Treatment) HPI  59 year old male presents to emergency department for evaluation of right hand laceration. Patient suffered laceration to his right palm 2 different locations. He denies any numbness or tingling. Pain is mild. He was placing a transmission into a truck when the transmission slipped and cut his palm. He is right-hand dominant. He denies any weakness or limited range of motion. Tetanus is not up-to-date. He denies any pain with range of motion.   Past Medical History  Diagnosis Date  . Coronary artery disease   . Depression   . Acute headache   . Hypertension   . Hyperlipidemia   . MI (myocardial infarction) (HCC)     x 2   Past Surgical History  Procedure Laterality Date  . Knee surgery      right knee   . Cardiac catheterization    . Coronary angioplasty with stent placement  2009  . Coronary artery bypass graft  2012    [facility]   Family History  Problem Relation Age of Onset  . Hypertension Mother   . Hypertension Father   . Hypertension Sister   . Hypertension Sister    Social History  Substance Use Topics  . Smoking status: Current Every Day Smoker -- 1.00 packs/day for 42 years    Types: Cigarettes  . Smokeless tobacco: None  . Alcohol Use: No    Review of Systems  Constitutional: Negative.  Negative for fever, chills, activity change and appetite change.  HENT: Negative for congestion, ear pain, mouth sores, rhinorrhea, sinus pressure, sore throat and trouble swallowing.   Eyes: Negative for photophobia, pain and discharge.  Respiratory: Negative for cough, chest tightness and shortness of breath.   Cardiovascular: Negative for chest pain and leg swelling.  Gastrointestinal: Negative  for nausea, vomiting, abdominal pain, diarrhea and abdominal distention.  Genitourinary: Negative for dysuria and difficulty urinating.  Musculoskeletal: Negative for back pain, arthralgias and gait problem.  Skin: Positive for wound. Negative for color change and rash.  Neurological: Negative for dizziness and headaches.  Hematological: Negative for adenopathy.  Psychiatric/Behavioral: Negative for behavioral problems and agitation.      Allergies  Review of patient's allergies indicates no known allergies.  Home Medications   Prior to Admission medications   Medication Sig Start Date End Date Taking? Authorizing Provider  albuterol (PROVENTIL HFA;VENTOLIN HFA) 108 (90 BASE) MCG/ACT inhaler Inhale 2 puffs into the lungs every 4 (four) hours as needed for wheezing or shortness of breath. 09/16/14   Antonieta Iba, MD  aspirin 81 MG chewable tablet Chew 1 tablet (81 mg total) by mouth daily. 10/27/14   Raymon Mutton Dunn, PA-C  cephALEXin (KEFLEX) 500 MG capsule Take 1 capsule (500 mg total) by mouth 4 (four) times daily. X 7 days 06/01/15   Evon Slack, PA-C  clopidogrel (PLAVIX) 75 MG tablet Take 1 tablet (75 mg total) by mouth daily. 10/27/14   Sondra Barges, PA-C  furosemide (LASIX) 20 MG tablet Take 1 tablet (20 mg total) by mouth daily as needed. 09/16/14   Antonieta Iba, MD  lisinopril (PRINIVIL,ZESTRIL) 10 MG tablet Take 10 mg by mouth daily.  09/09/14   Historical Provider, MD  loratadine (CLARITIN) 10 MG tablet Take 10 mg by  mouth daily.  09/09/14   Historical Provider, MD  metoprolol succinate (TOPROL XL) 50 MG 24 hr tablet Take 1 tablet (50 mg total) by mouth daily. Take with or immediately following a meal. 10/27/14   Sondra Bargesyan M Dunn, PA-C  nitroGLYCERIN (NITROSTAT) 0.4 MG SL tablet Place 0.4 mg under the tongue.  09/09/14   Historical Provider, MD   BP 158/91 mmHg  Pulse 78  Temp(Src) 98.2 F (36.8 C) (Oral)  Resp 18  Ht 5\' 7"  (1.702 m)  Wt 64.411 kg  BMI 22.24 kg/m2  SpO2  98% Physical Exam  Constitutional: He is oriented to person, place, and time. He appears well-developed and well-nourished.  HENT:  Head: Normocephalic and atraumatic.  Eyes: Conjunctivae and EOM are normal. Pupils are equal, round, and reactive to light.  Neck: Normal range of motion. Neck supple.  Cardiovascular: Normal rate and intact distal pulses.   Pulmonary/Chest: Effort normal. No respiratory distress.  Musculoskeletal: Normal range of motion. He exhibits no edema.  Examination of the right hand shows the patient has full composite fist with grip strength 5 out of 5. He has no pain with grip. He has no tendon deficits. He has a 5 cm linear laceration as well as a 2 cm laceration along the proximal palmar crease. Patient has no numbness or tingling in the median or ulnar nerve distribution. He has 2+ pulses. 2+ Refill. No foreign body.  Neurological: He is alert and oriented to person, place, and time.  Skin: Skin is warm and dry.  Psychiatric: He has a normal mood and affect. His behavior is normal. Judgment and thought content normal.    ED Course  Procedures (including critical care time) LACERATION REPAIR Performed by: Patience MuscaGAINES, THOMAS CHRISTOPHER Authorized by: Patience MuscaGAINES, THOMAS CHRISTOPHER Consent: Verbal consent obtained. Risks and benefits: risks, benefits and alternatives were discussed Consent given by: patient Patient identity confirmed: provided demographic data Prepped and Draped in normal sterile fashion Wound explored  Laceration Location: Right palm  Laceration Length: 7 cm  No Foreign Bodies seen or palpated  Anesthesia: local infiltration  Local anesthetic: lidocaine 1% % with epinephrine  Anesthetic total: 2.5 ml  Irrigation method: syringe Amount of cleaning: standard  Skin closure: 4-0 proline   Number of sutures: 10   Technique: Simple interrupted   Patient tolerance: Patient tolerated the procedure well with no immediate complications.Labs  Review Labs Reviewed - No data to display  Imaging Review No results found. I have personally reviewed and evaluated these images and lab results as part of my medical decision-making.   EKG Interpretation None      MDM   Final diagnoses:  Hand laceration, right, initial encounter    59 year old male with right hand laceration 2. No signs of foreign body. No tendon or neurological deficits on exam. Wound was thoroughly soaked and scrubbed with Betadine and saline to get grease off of the palmar surface. Laceration was repaired. He is placed on Keflex. He'll follow-up with orthopedics in 7-10 days for suture removal and wound check. Keep clean and dry. Turn to the ER for any worsening symptoms urgent changes in health.    Evon Slackhomas C Gaines, PA-C 06/01/15 1948  Arnaldo NatalPaul F Malinda, MD 06/01/15 [phone]

## 2015-06-30 ENCOUNTER — Ambulatory Visit: Payer: Self-pay | Admitting: Family Medicine

## 2015-09-01 ENCOUNTER — Emergency Department: Payer: Medicaid Other

## 2015-09-01 ENCOUNTER — Encounter: Payer: Self-pay | Admitting: Emergency Medicine

## 2015-09-01 ENCOUNTER — Emergency Department
Admission: EM | Admit: 2015-09-01 | Discharge: 2015-09-01 | Disposition: A | Discharge status: Home / Self Care | Payer: Medicaid Other | Attending: Emergency Medicine | Admitting: Emergency Medicine

## 2015-09-01 DIAGNOSIS — Z7902 Long term (current) use of antithrombotics/antiplatelets: Secondary | ICD-10-CM | POA: Diagnosis not present

## 2015-09-01 DIAGNOSIS — I10 Essential (primary) hypertension: Secondary | ICD-10-CM | POA: Insufficient documentation

## 2015-09-01 DIAGNOSIS — Z79899 Other long term (current) drug therapy: Secondary | ICD-10-CM | POA: Insufficient documentation

## 2015-09-01 DIAGNOSIS — J439 Emphysema, unspecified: Secondary | ICD-10-CM | POA: Diagnosis not present

## 2015-09-01 DIAGNOSIS — M1712 Unilateral primary osteoarthritis, left knee: Secondary | ICD-10-CM | POA: Insufficient documentation

## 2015-09-01 DIAGNOSIS — M25562 Pain in left knee: Secondary | ICD-10-CM | POA: Diagnosis present

## 2015-09-01 DIAGNOSIS — F17210 Nicotine dependence, cigarettes, uncomplicated: Secondary | ICD-10-CM | POA: Diagnosis not present

## 2015-09-01 DIAGNOSIS — Z7982 Long term (current) use of aspirin: Secondary | ICD-10-CM | POA: Diagnosis not present

## 2015-09-01 DIAGNOSIS — R079 Chest pain, unspecified: Secondary | ICD-10-CM | POA: Diagnosis not present

## 2015-09-01 LAB — CBC
HCT: 43.4 % (ref 40.0–52.0)
Hemoglobin: 14.3 g/dL (ref 13.0–18.0)
MCH: 30.5 pg (ref 26.0–34.0)
MCHC: 33 g/dL (ref 32.0–36.0)
MCV: 92.5 fL (ref 80.0–100.0)
Platelets: 207 10*3/uL (ref 150–440)
RBC: 4.69 MIL/uL (ref 4.40–5.90)
RDW: 13.6 % (ref 11.5–14.5)
WBC: 5.2 10*3/uL (ref 3.8–10.6)

## 2015-09-01 LAB — BASIC METABOLIC PANEL
Anion gap: 5 (ref 5–15)
BUN: 13 mg/dL (ref 6–20)
CALCIUM: 8.9 mg/dL (ref 8.9–10.3)
CO2: 27 mmol/L (ref 22–32)
CREATININE: 0.95 mg/dL (ref 0.61–1.24)
Chloride: 109 mmol/L (ref 101–111)
GFR calc non Af Amer: >60 mL/min (ref >60)
Glucose, Bld: 112 mg/dL — ABNORMAL HIGH (ref 65–99)
Potassium: 3.9 mmol/L (ref 3.5–5.1)
SODIUM: 141 mmol/L (ref 135–145)

## 2015-09-01 LAB — TROPONIN I: Troponin I: <0.03 ng/mL (ref <0.031)

## 2015-09-01 NOTE — ED Notes (Addendum)
Patient reports that he has had midline chest pain times 2 days. Patient also reports pain to the left knee, denies injury.

## 2015-09-01 NOTE — ED Provider Notes (Signed)
[facility] Emergency Department Provider Note  ____________________________________________  Time seen: Approximately 509 AM  I have reviewed the triage vital signs and the nursing notes.   HISTORY  Chief Complaint Chest Pain and Knee Pain    HPI Blake [name] is a 60 y.o. male who comes into the hospital today he reports to check his heart. The patient reports that he has some sharp pain in his mid chest that started one day ago. The patient has not taken anything for his pain but reports is a 2 out of 10 and this entity. He has had some shortness of breath but he denies any dizziness lightheadedness with nausea or vomiting. The patient has had this pain before but reports that when he was evaluated was told it was not a heart attack. The patient also complains of some left knee pain for the past 3-4 days. He denies any trauma and has not been taking anything for the pain. The patient reports that he decided to come in to get checked for both.   Past Medical History  Diagnosis Date  . Coronary artery disease   . Depression   . Acute headache   . Hypertension   . Hyperlipidemia   . MI (myocardial infarction) (HCC)     x 2    Patient Active Problem List   Diagnosis Date Noted  . Other chest pain 09/16/2014  . SOB (shortness of breath) 09/16/2014  . Hepatitis 09/16/2014  . S/P CABG (coronary artery bypass graft) 09/16/2014  . Coronary artery disease involving native coronary artery of native heart with other form of angina pectoris (HCC) 09/16/2014  . Shortness of breath 09/16/2014  . Smoker 09/16/2014  . Emphysema of lung (HCC) 09/16/2014  . Chronic systolic CHF (congestive heart failure) (HCC) 09/16/2014    Past Surgical History  Procedure Laterality Date  . Knee surgery      right knee   . Cardiac catheterization    . Coronary angioplasty with stent placement  2009  . Coronary artery bypass graft  2012    [facility]    Current Outpatient Rx   Name  Route  Sig  Dispense  Refill  . albuterol (PROVENTIL HFA;VENTOLIN HFA) 108 (90 BASE) MCG/ACT inhaler   Inhalation   Inhale 2 puffs into the lungs every 4 (four) hours as needed for wheezing or shortness of breath.   1 Inhaler   4   . aspirin 81 MG chewable tablet   Oral   Chew 1 tablet (81 mg total) by mouth daily.   30 tablet   11   . cephALEXin (KEFLEX) 500 MG capsule   Oral   Take 1 capsule (500 mg total) by mouth 4 (four) times daily. X 7 days   28 capsule   0   . clopidogrel (PLAVIX) 75 MG tablet   Oral   Take 1 tablet (75 mg total) by mouth daily.   30 tablet   11   . furosemide (LASIX) 20 MG tablet   Oral   Take 1 tablet (20 mg total) by mouth daily as needed.   90 tablet   3   . lisinopril (PRINIVIL,ZESTRIL) 10 MG tablet   Oral   Take 10 mg by mouth daily.          Marland Kitchen loratadine (CLARITIN) 10 MG tablet   Oral   Take 10 mg by mouth daily.          . metoprolol succinate (TOPROL XL) 50 MG  24 hr tablet   Oral   Take 1 tablet (50 mg total) by mouth daily. Take with or immediately following a meal.   30 tablet   11   . nitroGLYCERIN (NITROSTAT) 0.4 MG SL tablet   Sublingual   Place 0.4 mg under the tongue.            Allergies Review of patient's allergies indicates no known allergies.  Family History  Problem Relation Age of Onset  . Hypertension Mother   . Hypertension Father   . Hypertension Sister   . Hypertension Sister     Social History Social History  Substance Use Topics  . Smoking status: Current Every Day Smoker -- 1.00 packs/day for 42 years    Types: Cigarettes  . Smokeless tobacco: None  . Alcohol Use: No    Review of Systems Constitutional: No fever/chills Eyes: No visual changes. ENT: No sore throat. Cardiovascular:  chest pain. Respiratory:  shortness of breath. Gastrointestinal: No abdominal pain.  No nausea, no vomiting.  No diarrhea.  No constipation. Genitourinary: Negative for  dysuria. Musculoskeletal: Knee pain Skin: Negative for rash. Neurological: Negative for headaches, focal weakness or numbness.  10-point ROS otherwise negative.  ____________________________________________   PHYSICAL EXAM:  VITAL SIGNS: ED Triage Vitals  Enc Vitals Group     BP 09/01/15 0358 161/91 mmHg     Pulse Rate 09/01/15 0358 70     Resp 09/01/15 0358 18     Temp 09/01/15 0358 98.2 F (36.8 C)     Temp Source 09/01/15 0358 Oral     SpO2 09/01/15 0358 95 %     Weight 09/01/15 0354 142 lb (64.411 kg)     Height 09/01/15 0354  (1.651 m)     Head Cir --      Peak Flow --      Pain Score 09/01/15 0355 7     Pain Loc --      Pain Edu? --      Excl. in GC? --     Constitutional: Alert and oriented. Well appearing and in mild distress. Eyes: Conjunctivae are normal. PERRL. EOMI. Head: Atraumatic. Nose: No congestion/rhinnorhea. Mouth/Throat: Mucous membranes are moist.  Oropharynx non-erythematous. Cardiovascular: Normal rate, regular rhythm. Grossly normal heart sounds.  Good peripheral circulation. Respiratory: Normal respiratory effort.  No retractions. Lungs CTAB. Gastrointestinal: Soft and nontender. No distention. Positive bowel sounds Musculoskeletal: No lower extremity tenderness nor edema.  Mild left-sided knee Welling with no tenderness to palpation. Neurologic:  Normal speech and language.  Skin:  Skin is warm, dry and intact. Psychiatric: Mood and affect are normal.   ____________________________________________   LABS (all labs ordered are listed, but only abnormal results are displayed)  Labs Reviewed  BASIC METABOLIC PANEL - Abnormal; Notable for the following:    Glucose, Bld 112 (*)    All other components within normal limits  CBC  TROPONIN I  TROPONIN I   ____________________________________________  EKG  ED ECG REPORT I, Rebecka Apley, the attending physician, personally viewed and interpreted this ECG.   Date:  09/01/2015  EKG Time: 357  Rate: 67  Rhythm: normal sinus rhythm  Axis: normal  Intervals:none  ST&T Change: flipped t wave leads 1, aVL, V4, V5 and V6 seen on previous EKG  ____________________________________________  RADIOLOGY  Chest x-ray: No active cardiopulmonary disease Left knee x-ray: No evidence of acute fracture or dislocation, appearance of the lateral tibial plateau suggest prior injury with mild associated depression,  joint space narrowing at the medial compartment with cortical irregularity and sclerosis, tricompartmental osteoarthritis noted, apparently's bodies noted at the joint space. ____________________________________________   PROCEDURES  Procedure(s) performed: None  Critical Care performed: No  ____________________________________________   INITIAL IMPRESSION / ASSESSMENT AND PLAN / ED COURSE  Pertinent labs & imaging results that were available during my care of the patient were reviewed by me and considered in my medical decision making (see chart for details).  This is a 60 year old male who comes into the hospital today with chest pain and knee pain. The patient's pain is improving. I will repeat a troponin and if it is unremarkable I will discharge the patient to home.  The patient's care was signed out to Dr. Mayford Knife who will follow-up the results of the repeat troponin. The patient can take some ibuprofen at home for his osteoarthritis he should follow-up with his primary care physician. ____________________________________________   FINAL CLINICAL IMPRESSION(S) / ED DIAGNOSES  Final diagnoses:  Chest pain, unspecified chest pain type  Primary osteoarthritis of left knee      Rebecka Apley, MD 09/01/15 [phone]

## 2015-09-01 NOTE — ED Notes (Signed)
Patient transported to X-ray 

## 2015-09-01 NOTE — ED Provider Notes (Signed)
Repeat troponin is negative, he is stable for outpatient follow-up.  Emily Filbert, MD 09/01/15 [phone]

## 2015-09-01 NOTE — Discharge Instructions (Signed)
Nonspecific Chest Pain  Chest pain can be caused by many different conditions. There is always a chance that your pain could be related to something serious, such as a heart attack or a blood clot in your lungs. Chest pain can also be caused by conditions that are not life-threatening. If you have chest pain, it is very important to follow up with your health care provider. CAUSES  Chest pain can be caused by:  Heartburn.  Pneumonia or bronchitis.  Anxiety or stress.  Inflammation around your heart (pericarditis) or lung (pleuritis or pleurisy).  A blood clot in your lung.  A collapsed lung (pneumothorax). It can develop suddenly on its own (spontaneous pneumothorax) or from trauma to the chest.  Shingles infection (varicella-zoster virus).  Heart attack.  Damage to the bones, muscles, and cartilage that make up your chest wall. This can include:  Bruised bones due to injury.  Strained muscles or cartilage due to frequent or repeated coughing or overwork.  Fracture to one or more ribs.  Sore cartilage due to inflammation (costochondritis). RISK FACTORS  Risk factors for chest pain may include:  Activities that increase your risk for trauma or injury to your chest.  Respiratory infections or conditions that cause frequent coughing.  Medical conditions or overeating that can cause heartburn.  Heart disease or family history of heart disease.  Conditions or health behaviors that increase your risk of developing a blood clot.  Having had chicken pox (varicella zoster). SIGNS AND SYMPTOMS Chest pain can feel like:  Burning or tingling on the surface of your chest or deep in your chest.  Crushing, pressure, aching, or squeezing pain.  Dull or sharp pain that is worse when you move, cough, or take a deep breath.  Pain that is also felt in your back, neck, shoulder, or arm, or pain that spreads to any of these areas. Your chest pain may come and go, or it may stay  constant. DIAGNOSIS Lab tests or other studies may be needed to find the cause of your pain. Your health care provider may have you take a test called an ambulatory ECG (electrocardiogram). An ECG records your heartbeat patterns at the time the test is performed. You may also have other tests, such as:  Transthoracic echocardiogram (TTE). During echocardiography, sound waves are used to create a picture of all of the heart structures and to look at how blood flows through your heart.  Transesophageal echocardiogram (TEE).This is a more advanced imaging test that obtains images from inside your body. It allows your health care provider to see your heart in finer detail.  Cardiac monitoring. This allows your health care provider to monitor your heart rate and rhythm in real time.  Holter monitor. This is a portable device that records your heartbeat and can help to diagnose abnormal heartbeats. It allows your health care provider to track your heart activity for several days, if needed.  Stress tests. These can be done through exercise or by taking medicine that makes your heart beat more quickly.  Blood tests.  Imaging tests. TREATMENT  Your treatment depends on what is causing your chest pain. Treatment may include:  Medicines. These may include:  Acid blockers for heartburn.  Anti-inflammatory medicine.  Pain medicine for inflammatory conditions.  Antibiotic medicine, if an infection is present.  Medicines to dissolve blood clots.  Medicines to treat coronary artery disease.  Supportive care for conditions that do not require medicines. This may include:  Resting.  Applying heat  or cold packs to injured areas.  Limiting activities until pain decreases. HOME CARE INSTRUCTIONS  If you were prescribed an antibiotic medicine, finish it all even if you start to feel better.  Avoid any activities that bring on chest pain.  Do not use any tobacco products, including  cigarettes, chewing tobacco, or electronic cigarettes. If you need help quitting, ask your health care provider.  Do not drink alcohol.  Take medicines only as directed by your health care provider.  Keep all follow-up visits as directed by your health care provider. This is important. This includes any further testing if your chest pain does not go away.  If heartburn is the cause for your chest pain, you may be told to keep your head raised (elevated) while sleeping. This reduces the chance that acid will go from your stomach into your esophagus.  Make lifestyle changes as directed by your health care provider. These may include:  Getting regular exercise. Ask your health care provider to suggest some activities that are safe for you.  Eating a heart-healthy diet. A registered dietitian can help you to learn healthy eating options.  Maintaining a healthy weight.  Managing diabetes, if necessary.  Reducing stress. SEEK MEDICAL CARE IF:  Your chest pain does not go away after treatment.  You have a rash with blisters on your chest.  You have a fever. SEEK IMMEDIATE MEDICAL CARE IF:   Your chest pain is worse.  You have an increasing cough, or you cough up blood.  You have severe abdominal pain.  You have severe weakness.  You faint.  You have chills.  You have sudden, unexplained chest discomfort.  You have sudden, unexplained discomfort in your arms, back, neck, or jaw.  You have shortness of breath at any time.  You suddenly start to sweat, or your skin gets clammy.  You feel nauseous or you vomit.  You suddenly feel light-headed or dizzy.  Your heart begins to beat quickly, or it feels like it is skipping beats. These symptoms may represent a serious problem that is an emergency. Do not wait to see if the symptoms will go away. Get medical help right away. Call your local emergency services (911 in the [place] Do not drive yourself to the hospital.   This  information is not intended to replace advice given to you by your health care provider. Make sure you discuss any questions you have with your health care provider.   Document Released: 04/05/2005 Document Revised: 07/17/2014 Document Reviewed: 01/30/2014 Elsevier Interactive Patient Education 2016 [employer]  Osteoarthritis Osteoarthritis is a disease that causes soreness and inflammation of a joint. It occurs when the cartilage at the affected joint wears down. Cartilage acts as a cushion, covering the ends of bones where they meet to form a joint. Osteoarthritis is the most common form of arthritis. It often occurs in older people. The joints affected most often by this condition include those in the:  Ends of the fingers.  Thumbs.  Neck.  Lower back.  Knees.  Hips. CAUSES  Over time, the cartilage that covers the ends of bones begins to wear away. This causes bone to rub on bone, producing pain and stiffness in the affected joints.  RISK FACTORS Certain factors can increase your chances of having osteoarthritis, including:  Older age.  Excessive body weight.  Overuse of joints.  Previous joint injury. SIGNS AND SYMPTOMS   Pain, swelling, and stiffness in the joint.  Over time, the joint  may lose its normal shape.  Small deposits of bone (osteophytes) may grow on the edges of the joint.  Bits of bone or cartilage can break off and float inside the joint space. This may cause more pain and damage. DIAGNOSIS  Your health care provider will do a physical exam and ask about your symptoms. Various tests may be ordered, such as:  X-rays of the affected joint.  Blood tests to rule out other types of arthritis. Additional tests may be used to diagnose your condition. TREATMENT  Goals of treatment are to control pain and improve joint function. Treatment plans may include:  A prescribed exercise program that allows for rest and joint relief.  A weight control  plan.  Pain relief techniques, such as:  Properly applied heat and cold.  Electric pulses delivered to nerve endings under the skin (transcutaneous electrical nerve stimulation [TENS]).  Massage.  Certain nutritional supplements.  Medicines to control pain, such as:  Acetaminophen.  Nonsteroidal anti-inflammatory drugs (NSAIDs), such as naproxen.  Narcotic or central-acting agents, such as tramadol.  Corticosteroids. These can be given orally or as an injection.  Surgery to reposition the bones and relieve pain (osteotomy) or to remove loose pieces of bone and cartilage. Joint replacement may be needed in advanced states of osteoarthritis. HOME CARE INSTRUCTIONS   Take medicines only as directed by your health care provider.  Maintain a healthy weight. Follow your health care provider's instructions for weight control. This may include dietary instructions.  Exercise as directed. Your health care provider can recommend specific types of exercise. These may include:  Strengthening exercises. These are done to strengthen the muscles that support joints affected by arthritis. They can be performed with weights or with exercise bands to add resistance.  Aerobic activities. These are exercises, such as brisk walking or low-impact aerobics, that get your heart pumping.  Range-of-motion activities. These keep your joints limber.  Balance and agility exercises. These help you maintain daily living skills.  Rest your affected joints as directed by your health care provider.  Keep all follow-up visits as directed by your health care provider. SEEK MEDICAL CARE IF:   Your skin turns red.  You develop a rash in addition to your joint pain.  You have worsening joint pain.  You have a fever along with joint or muscle aches. SEEK IMMEDIATE MEDICAL CARE IF:  You have a significant loss of weight or appetite.  You have night sweats. FOR MORE INFORMATION   National Institute  of Arthritis and Musculoskeletal and Skin Diseases: www.niams.http://www.myers.net/  [employer] on Aging: https://walker.com/  American College of Rheumatology: www.rheumatology.org   This information is not intended to replace advice given to you by your health care provider. Make sure you discuss any questions you have with your health care provider.   Document Released: 06/26/2005 Document Revised: 07/17/2014 Document Reviewed: 03/03/2013 Elsevier Interactive Patient Education [employer]

## 2015-09-01 NOTE — ED Notes (Signed)
Pt asleep at this time. NAD. VSS. Respirations unlabored. Skin warm dry and pink. Waiting on disposition.

## 2015-09-29 ENCOUNTER — Encounter: Payer: Self-pay | Admitting: Cardiovascular Disease

## 2015-09-29 ENCOUNTER — Ambulatory Visit
Admission: RE | Admit: 2015-09-29 | Discharge: 2015-09-29 | Disposition: A | Discharge status: Home / Self Care | Payer: Medicaid Other | Source: Ambulatory Visit | Attending: Family Medicine | Admitting: Family Medicine

## 2015-09-29 ENCOUNTER — Ambulatory Visit (INDEPENDENT_AMBULATORY_CARE_PROVIDER_SITE_OTHER): Payer: Self-pay | Admitting: Family Medicine

## 2015-09-29 ENCOUNTER — Ambulatory Visit (INDEPENDENT_AMBULATORY_CARE_PROVIDER_SITE_OTHER): Payer: Medicaid Other | Admitting: Cardiovascular Disease

## 2015-09-29 ENCOUNTER — Inpatient Hospital Stay: Admit: 2015-09-29 | Payer: Self-pay

## 2015-09-29 VITALS — BP 140/78 | HR 69 | Ht 67.0 in | Wt 164.0 lb

## 2015-09-29 VITALS — BP 150/79 | HR 76 | Temp 98.6°F | Resp 16 | Ht 66.0 in | Wt 164.0 lb

## 2015-09-29 DIAGNOSIS — F329 Major depressive disorder, single episode, unspecified: Secondary | ICD-10-CM | POA: Insufficient documentation

## 2015-09-29 DIAGNOSIS — Z1211 Encounter for screening for malignant neoplasm of colon: Secondary | ICD-10-CM | POA: Insufficient documentation

## 2015-09-29 DIAGNOSIS — R0689 Other abnormalities of breathing: Secondary | ICD-10-CM | POA: Insufficient documentation

## 2015-09-29 DIAGNOSIS — R0602 Shortness of breath: Secondary | ICD-10-CM | POA: Diagnosis present

## 2015-09-29 DIAGNOSIS — R05 Cough: Secondary | ICD-10-CM | POA: Insufficient documentation

## 2015-09-29 DIAGNOSIS — E7800 Pure hypercholesterolemia, unspecified: Secondary | ICD-10-CM | POA: Insufficient documentation

## 2015-09-29 DIAGNOSIS — I2510 Atherosclerotic heart disease of native coronary artery without angina pectoris: Secondary | ICD-10-CM

## 2015-09-29 DIAGNOSIS — I252 Old myocardial infarction: Secondary | ICD-10-CM | POA: Insufficient documentation

## 2015-09-29 DIAGNOSIS — I5022 Chronic systolic (congestive) heart failure: Secondary | ICD-10-CM | POA: Diagnosis not present

## 2015-09-29 DIAGNOSIS — I10 Essential (primary) hypertension: Secondary | ICD-10-CM | POA: Insufficient documentation

## 2015-09-29 DIAGNOSIS — Z720 Tobacco use: Secondary | ICD-10-CM | POA: Insufficient documentation

## 2015-09-29 DIAGNOSIS — R519 Headache, unspecified: Secondary | ICD-10-CM | POA: Insufficient documentation

## 2015-09-29 DIAGNOSIS — F209 Schizophrenia, unspecified: Secondary | ICD-10-CM | POA: Insufficient documentation

## 2015-09-29 DIAGNOSIS — N401 Enlarged prostate with lower urinary tract symptoms: Secondary | ICD-10-CM | POA: Insufficient documentation

## 2015-09-29 DIAGNOSIS — Z951 Presence of aortocoronary bypass graft: Secondary | ICD-10-CM | POA: Diagnosis not present

## 2015-09-29 DIAGNOSIS — J432 Centrilobular emphysema: Secondary | ICD-10-CM

## 2015-09-29 DIAGNOSIS — R059 Cough, unspecified: Secondary | ICD-10-CM | POA: Insufficient documentation

## 2015-09-29 DIAGNOSIS — R079 Chest pain, unspecified: Secondary | ICD-10-CM | POA: Insufficient documentation

## 2015-09-29 DIAGNOSIS — J0110 Acute frontal sinusitis, unspecified: Secondary | ICD-10-CM

## 2015-09-29 DIAGNOSIS — R6889 Other general symptoms and signs: Secondary | ICD-10-CM | POA: Insufficient documentation

## 2015-09-29 DIAGNOSIS — I519 Heart disease, unspecified: Secondary | ICD-10-CM | POA: Insufficient documentation

## 2015-09-29 DIAGNOSIS — F17220 Nicotine dependence, chewing tobacco, uncomplicated: Secondary | ICD-10-CM | POA: Insufficient documentation

## 2015-09-29 DIAGNOSIS — N138 Other obstructive and reflux uropathy: Secondary | ICD-10-CM | POA: Insufficient documentation

## 2015-09-29 DIAGNOSIS — F32A Depression, unspecified: Secondary | ICD-10-CM | POA: Insufficient documentation

## 2015-09-29 DIAGNOSIS — R51 Headache: Secondary | ICD-10-CM

## 2015-09-29 DIAGNOSIS — E785 Hyperlipidemia, unspecified: Secondary | ICD-10-CM | POA: Insufficient documentation

## 2015-09-29 MED ORDER — METOPROLOL SUCCINATE ER 50 MG PO TB24: 50.0000 mg | ORAL_TABLET | Freq: Every day | ORAL | Status: DC

## 2015-09-29 MED ORDER — DM-GUAIFENESIN ER 30-600 MG PO TB12: 1.0000 | ORAL_TABLET | Freq: Two times a day (BID) | ORAL | Status: DC

## 2015-09-29 MED ORDER — FLUTICASONE PROPIONATE 50 MCG/ACT NA SUSP
2.0000 | Freq: Every day | NASAL | Status: DC
Start: 2015-09-29 — End: 2018-06-24

## 2015-09-29 MED ORDER — CLOPIDOGREL BISULFATE 75 MG PO TABS: 75.0000 mg | ORAL_TABLET | Freq: Every day | ORAL | Status: DC

## 2015-09-29 MED ORDER — LISINOPRIL 10 MG PO TABS: 10.0000 mg | ORAL_TABLET | Freq: Every day | ORAL | Status: DC

## 2015-09-29 MED ORDER — ALBUTEROL SULFATE HFA 108 (90 BASE) MCG/ACT IN AERS: 2.0000 | INHALATION_SPRAY | RESPIRATORY_TRACT | Status: DC | PRN

## 2015-09-29 MED ORDER — FUROSEMIDE 20 MG PO TABS: 20.0000 mg | ORAL_TABLET | Freq: Every day | ORAL | Status: DC | PRN

## 2015-09-29 MED ORDER — AMOXICILLIN-POT CLAVULANATE 875-125 MG PO TABS: 1.0000 | ORAL_TABLET | Freq: Two times a day (BID) | ORAL | Status: DC

## 2015-09-29 NOTE — Assessment & Plan Note (Signed)
Breathing issues CHF vs COPD exacerbation.  CXR shows no pneumonia. Given uncertain CHF status without follow-up from cardiology and no recent EF, I am reluctant to give this patient prednisone at this time. Will treat with albuterol inhaler and supportive care. Plan for prednisone with worsening symptoms.

## 2015-09-29 NOTE — Progress Notes (Signed)
Subjective:    Patient ID: Blake [name], male    DOB: 01/08/1956, 60 y.o.   MRN: [id number]  HPI: Blake [name] is a 60 y.o. male presenting on 09/29/2015 for Nasal Congestion   HPI  Pt presents for nasal congestion x3 week. Symptoms began with cold like symptoms about 3 weeks ago. Lingering sinus pain and pressure. Painful behind both eyes. Runny nose. Ear pain. No sore throat. Cough- non productive of sputum.  Pt reports feeling short of breath- worsening x1 month. Feeling short of breath at night. Sleeping on 1-2  pillows. Feels short of breath when he lays flat. Has not seen Dr. Mariah Milling in about 1 year- he was supposed to have repeat ECHO but did not show. Has not been taking lasix.  No leg swelling. No abdominal swelling. Pt has been out of his inhaler for several weeks. Pt has been out of all medications including plavix x several months.  Still smoking- 1 pack per day.   Pt has not been into the office in > 1 year. He is out of all of his medications. Denies chest pain at home. Is not checking BP.   Past Medical History  Diagnosis Date  . Coronary artery disease   . Depression   . Acute headache   . Hypertension   . Hyperlipidemia   . MI (myocardial infarction) (HCC)     x 2    Current Outpatient Prescriptions on File Prior to Visit  Medication Sig  . aspirin 81 MG chewable tablet Chew 1 tablet (81 mg total) by mouth daily.  Marland Kitchen loratadine (CLARITIN) 10 MG tablet Take 10 mg by mouth daily. Reported on 09/29/2015  . nitroGLYCERIN (NITROSTAT) 0.4 MG SL tablet Place 0.4 mg under the tongue. Reported on 09/29/2015   No current facility-administered medications on file prior to visit.    Review of Systems  Constitutional: Negative for fever and chills.  HENT: Positive for congestion, postnasal drip, rhinorrhea, sinus pressure and sneezing. Negative for ear pain.   Respiratory: Positive for cough and shortness of breath. Negative for chest tightness and wheezing.     Cardiovascular: Negative for chest pain, palpitations and leg swelling.  Skin: Negative.   Allergic/Immunologic: Negative for environmental allergies.  Neurological: Negative for headaches.   Per HPI unless specifically indicated above     Objective:    BP 150/79 mmHg  Pulse 76  Temp(Src) 98.6 F (37 C) (Oral)  Resp 16  Ht  (1.676 m)  Wt 164 lb (74.39 kg)  BMI 26.48 kg/m2  SpO2 98%  Wt Readings from Last 3 Encounters:  09/29/15 164 lb (74.39 kg)  09/01/15 142 lb (64.411 kg)  06/01/15 142 lb (64.411 kg)    Physical Exam  Constitutional: He appears well-developed and well-nourished. No distress.  HENT:  Head: Normocephalic and atraumatic.  Right Ear: Hearing and tympanic membrane normal.  Left Ear: Hearing and tympanic membrane normal.  Nose: Mucosal edema and rhinorrhea present. Right sinus exhibits frontal sinus tenderness. Left sinus exhibits frontal sinus tenderness.  Mouth/Throat: Oropharynx is clear and moist and mucous membranes are normal.  Cardiovascular: Normal rate and regular rhythm.  Exam reveals no gallop and no friction rub.   No murmur heard. Pulmonary/Chest: Effort normal. He has no decreased breath sounds. He has wheezes (expiratory). He has rhonchi in the right lower field and the left lower field. He has no rales. Chest wall is not dull to percussion. He exhibits no tenderness.  Skin: He is not diaphoretic.  Results for orders placed or performed during the hospital encounter of 09/01/15  Basic metabolic panel  Result Value Ref Range   Sodium 141 135 - 145 mmol/L   Potassium 3.9 3.5 - 5.1 mmol/L   Chloride 109 101 - 111 mmol/L   CO2 27 22 - 32 mmol/L   Glucose, Bld 112 (H) 65 - 99 mg/dL   BUN 13 6 - 20 mg/dL   Creatinine, Ser 4.09 0.61 - 1.24 mg/dL   Calcium 8.9 8.9 - 81.1 mg/dL   GFR calc non Af Amer >60 >60 mL/min   GFR calc Af Amer >60 >60 mL/min   Anion gap 5 5 - 15  CBC  Result Value Ref Range   WBC 5.2 3.8 - 10.6 K/uL   RBC 4.69  4.40 - 5.90 MIL/uL   Hemoglobin 14.3 13.0 - 18.0 g/dL   HCT 91.4 78.2 - 95.6 %   MCV 92.5 80.0 - 100.0 fL   MCH 30.5 26.0 - 34.0 pg   MCHC 33.0 32.0 - 36.0 g/dL   RDW 21.3 08.6 - 57.8 %   Platelets 207 150 - 440 K/uL  Troponin I  Result Value Ref Range   Troponin I <0.03 <0.031 ng/mL  Troponin I  Result Value Ref Range   Troponin I <0.03 <0.031 ng/mL      Assessment & Plan:   Problem List Items Addressed This Visit      Cardiovascular and Mediastinum   Chronic systolic CHF (congestive heart failure) (HCC) - Primary    Last known EF was 30% in 2009. Pt has been out of all his medication and has not followed up with cardiology in 1 year. Concerned his SOB may be worsening CHF vs COPD exacerbation. Refilled current medications x 2 mos until he can see cardiology.  Have made pt appt for April 2017 to see Dr. Mariah Milling.       Relevant Medications   furosemide (LASIX) 20 MG tablet   lisinopril (PRINIVIL,ZESTRIL) 10 MG tablet   metoprolol succinate (TOPROL XL) 50 MG 24 hr tablet   Other Relevant Orders   DG Chest 2 View (Completed)   Essential (primary) hypertension    Elevated BP today. Pt has been out of all medications. Refilled lisinopril. Recheck in 2 weeks for lab work and full visit.       Relevant Medications   furosemide (LASIX) 20 MG tablet   lisinopril (PRINIVIL,ZESTRIL) 10 MG tablet   metoprolol succinate (TOPROL XL) 50 MG 24 hr tablet     Respiratory   Emphysema of lung (HCC)    Breathing issues CHF vs COPD exacerbation.  CXR shows no pneumonia. Given uncertain CHF status without follow-up from cardiology and no recent EF, I am reluctant to give this patient prednisone at this time. Will treat with albuterol inhaler and supportive care. Plan for prednisone with worsening symptoms.       Relevant Medications   albuterol (PROVENTIL HFA;VENTOLIN HFA) 108 (90 Base) MCG/ACT inhaler   fluticasone (FLONASE) 50 MCG/ACT nasal spray   dextromethorphan-guaiFENesin (MUCINEX DM)  30-600 MG 12hr tablet    Other Visit Diagnoses    Acute frontal sinusitis, recurrence not specified        Treat for sinus infection given 3 week duration of symptoms. Augmentin BID x 5 days. Supportive care. Alarm  symptoms reviewed.     Relevant Medications    fluticasone (FLONASE) 50 MCG/ACT nasal spray    amoxicillin-clavulanate (AUGMENTIN) 875-125 MG tablet    dextromethorphan-guaiFENesin (MUCINEX DM)  30-600 MG 12hr tablet    Coronary artery disease involving native coronary artery of native heart without angina pectoris        Relevant Medications    furosemide (LASIX) 20 MG tablet    clopidogrel (PLAVIX) 75 MG tablet    lisinopril (PRINIVIL,ZESTRIL) 10 MG tablet    metoprolol succinate (TOPROL XL) 50 MG 24 hr tablet       Meds ordered this encounter  Medications  . SUBOXONE 8-2 MG FILM    Sig: PLACE 1 FILM UNDER THE TONGUE TWICE A DAY    Refill:  0  . furosemide (LASIX) 20 MG tablet    Sig: Take 1 tablet (20 mg total) by mouth daily as needed.    Dispense:  30 tablet    Refill:  2    Order Specific Question:  Supervising Provider    Answer:  Janeann ForehandHAWKINS JR, JAMES H [phone][[id number]  . albuterol (PROVENTIL HFA;VENTOLIN HFA) 108 (90 Base) MCG/ACT inhaler    Sig: Inhale 2 puffs into the lungs every 4 (four) hours as needed for wheezing or shortness of breath.    Dispense:  1 Inhaler    Refill:  11    Order Specific Question:  Supervising Provider    Answer:  Janeann ForehandHAWKINS JR, JAMES H [phone][[id number]]  . clopidogrel (PLAVIX) 75 MG tablet    Sig: Take 1 tablet (75 mg total) by mouth daily.    Dispense:  30 tablet    Refill:  2    Order Specific Question:  Supervising Provider    Answer:  Janeann ForehandHAWKINS JR, JAMES H [phone][[id number]]  . lisinopril (PRINIVIL,ZESTRIL) 10 MG tablet    Sig: Take 1 tablet (10 mg total) by mouth daily. Reported on 09/29/2015    Dispense:  30 tablet    Refill:  11    Order Specific Question:  Supervising Provider    Answer:  Janeann ForehandHAWKINS JR, JAMES H [phone][[id number]]  . metoprolol succinate (TOPROL  XL) 50 MG 24 hr tablet    Sig: Take 1 tablet (50 mg total) by mouth daily. Take with or immediately following a meal.    Dispense:  30 tablet    Refill:  2    Order Specific Question:  Supervising Provider    Answer:  Janeann ForehandHAWKINS JR, JAMES H [phone][[id number]]  . fluticasone (FLONASE) 50 MCG/ACT nasal spray    Sig: Place 2 sprays into both nostrils daily.    Dispense:  16 g    Refill:  11    Order Specific Question:  Supervising Provider    Answer:  Janeann ForehandHAWKINS JR, JAMES H [phone][[id number]]  . amoxicillin-clavulanate (AUGMENTIN) 875-125 MG tablet    Sig: Take 1 tablet by mouth 2 (two) times daily.    Dispense:  10 tablet    Refill:  0    Order Specific Question:  Supervising Provider    Answer:  Janeann ForehandHAWKINS JR, JAMES H [phone][[id number]]  . dextromethorphan-guaiFENesin (MUCINEX DM) 30-600 MG 12hr tablet    Sig: Take 1 tablet by mouth 2 (two) times daily.    Dispense:  20 tablet    Refill:  0    Order Specific Question:  Supervising Provider    Answer:  Janeann ForehandHAWKINS JR, JAMES H [[id number]]      Follow up plan: Return in about 2 weeks (around 10/13/2015) for HTN.

## 2015-09-29 NOTE — Patient Instructions (Addendum)
No medication changes were made.  Try to quit smoking  We will order an echocardiogram for Coronary disease, hx of CABG, shortness of breath  Date & time:____________________________________  We will check liver and lipids the morning of the echo Nothing to eat or drink that morning except for water  Please call us if you have new issues that need to be addressed before your next appt.  Your physician wants you to follow-up in: 6 months.  You will receive a reminder letter in the mail two months in advance. If you don't receive a letter, please call our office to schedule the follow-up appointment.  Echocardiogram An echocardiogram, or echocardiography, uses sound waves (ultrasound) to produce an image of your heart. The echocardiogram is simple, painless, obtained within a short period of time, and offers valuable information to your health care provider. The images from an echocardiogram can provide information such as:  Evidence of coronary artery disease (CAD).  Heart size.  Heart muscle function.  Heart valve function.  Aneurysm detection.  Evidence of a past heart attack.  Fluid buildup around the heart.  Heart muscle thickening.  Assess heart valve function. LET [facility] PROVIDER KNOW ABOUT:  Any allergies you have.  All medicines you are taking, including vitamins, herbs, eye drops, creams, and over-the-counter medicines.  Previous problems you or members of your family have had with the use of anesthetics.  Any blood disorders you have.  Previous surgeries you have had.  Medical conditions you have.  Possibility of pregnancy, if this applies. BEFORE THE PROCEDURE  No special preparation is needed. Eat and drink normally.  PROCEDURE   In order to produce an image of your heart, gel will be applied to your chest and a wand-like tool (transducer) will be moved over your chest. The gel will help transmit the sound waves from the transducer. The  sound waves will harmlessly bounce off your heart to allow the heart images to be captured in real-time motion. These images will then be recorded.  You may need an IV to receive a medicine that improves the quality of the pictures. AFTER THE PROCEDURE You may return to your normal schedule including diet, activities, and medicines, unless your health care provider tells you otherwise.   This information is not intended to replace advice given to you by your health care provider. Make sure you discuss any questions you have with your health care provider.   Document Released: 06/23/2000 Document Revised: 07/17/2014 Document Reviewed: 03/03/2013 Elsevier Interactive Patient Education 2016 [employer]     Steps to Quit Smoking  Smoking tobacco can be harmful to your health and can affect almost every organ in your body. Smoking puts you, and those around you, at risk for developing many serious chronic diseases. Quitting smoking is difficult, but it is one of the best things that you can do for your health. It is never too late to quit. WHAT ARE THE BENEFITS OF QUITTING SMOKING? When you quit smoking, you lower your risk of developing serious diseases and conditions, such as:  Lung cancer or lung disease, such as COPD.  Heart disease.  Stroke.  Heart attack.  Infertility.  Osteoporosis and bone fractures. Additionally, symptoms such as coughing, wheezing, and shortness of breath may get better when you quit. You may also find that you get sick less often because your body is stronger at fighting off colds and infections. If you are pregnant, quitting smoking can help to reduce your chances  of having a baby of low birth weight. HOW DO I GET READY TO QUIT? When you decide to quit smoking, create a plan to make sure that you are successful. Before you quit:  Pick a date to quit. Set a date within the next two weeks to give you time to prepare.  Write down the reasons why you are  quitting. Keep this list in places where you will see it often, such as on your bathroom mirror or in your car or wallet.  Identify the people, places, things, and activities that make you want to smoke (triggers) and avoid them. Make sure to take these actions:  Throw away all cigarettes at home, at work, and in your car.  Throw away smoking accessories, such as Set designer.  Clean your car and make sure to empty the ashtray.  Clean your home, including curtains and carpets.  Tell your family, friends, and coworkers that you are quitting. Support from your loved ones can make quitting easier.  Talk with your health care provider about your options for quitting smoking.  Find out what treatment options are covered by your health insurance. WHAT STRATEGIES CAN I USE TO QUIT SMOKING?  Talk with your healthcare provider about different strategies to quit smoking. Some strategies include:  Quitting smoking altogether instead of gradually lessening how much you smoke over a period of time. Research shows that quitting "cold [place]" is more successful than gradually quitting.  Attending in-person counseling to help you build problem-solving skills. You are more likely to have success in quitting if you attend several counseling sessions. Even short sessions of 10 minutes can be effective.  Finding resources and support systems that can help you to quit smoking and remain smoke-free after you quit. These resources are most helpful when you use them often. They can include:  Online chats with a Veterinary surgeon.  Telephone quitlines.  Printed Materials engineer.  Support groups or group counseling.  Text messaging programs.  Mobile phone applications.  Taking medicines to help you quit smoking. (If you are pregnant or breastfeeding, talk with your health care provider first.) Some medicines contain nicotine and some do not. Both types of medicines help with cravings, but the  medicines that include nicotine help to relieve withdrawal symptoms. Your health care provider may recommend:  Nicotine patches, gum, or lozenges.  Nicotine inhalers or sprays.  Non-nicotine medicine that is taken by mouth. Talk with your health care provider about combining strategies, such as taking medicines while you are also receiving in-person counseling. Using these two strategies together makes you more likely to succeed in quitting than if you used either strategy on its own. If you are pregnant or breastfeeding, talk with your health care provider about finding counseling or other support strategies to quit smoking. Do not take medicine to help you quit smoking unless told to do so by your health care provider. WHAT THINGS CAN I DO TO MAKE IT EASIER TO QUIT? Quitting smoking might feel overwhelming at first, but there is a lot that you can do to make it easier. Take these important actions:  Reach out to your family and friends and ask that they support and encourage you during this time. Call telephone quitlines, reach out to support groups, or work with a counselor for support.  Ask people who smoke to avoid smoking around you.  Avoid places that trigger you to smoke, such as bars, parties, or smoke-break areas at work.  Spend time around people  who do not smoke.  Lessen stress in your life, because stress can be a smoking trigger for some people. To lessen stress, try:  Exercising regularly.  Deep-breathing exercises.  Yoga.  Meditating.  Performing a body scan. This involves closing your eyes, scanning your body from head to toe, and noticing which parts of your body are particularly tense. Purposefully relax the muscles in those areas.  Download or purchase mobile phone or tablet apps (applications) that can help you stick to your quit plan by providing reminders, tips, and encouragement. There are many free apps, such as QuitGuide from the [employer] developer for Disease  Control and Prevention). You can find other support for quitting smoking (smoking cessation) through smokefree.gov and other websites. HOW WILL I FEEL WHEN I QUIT SMOKING? Within the first 24 hours of quitting smoking, you may start to feel some withdrawal symptoms. These symptoms are usually most noticeable 2-3 days after quitting, but they usually do not last beyond 2-3 weeks. Changes or symptoms that you might experience include:  Mood swings.  Restlessness, anxiety, or irritation.  Difficulty concentrating.  Dizziness.  Strong cravings for sugary foods in addition to nicotine.  Mild weight gain.  Constipation.  Nausea.  Coughing or a sore throat.  Changes in how your medicines work in your body.  A depressed mood.  Difficulty sleeping (insomnia). After the first 2-3 weeks of quitting, you may start to notice more positive results, such as:  Improved sense of smell and taste.  Decreased coughing and sore throat.  Slower heart rate.  Lower blood pressure.  Clearer skin.  The ability to breathe more easily.  Fewer sick days. Quitting smoking is very challenging for most people. Do not get discouraged if you are not successful the first time. Some people need to make many attempts to quit before they achieve long-term success. Do your best to stick to your quit plan, and talk with your health care provider if you have any questions or concerns.   This information is not intended to replace advice given to you by your health care provider. Make sure you discuss any questions you have with your health care provider.   Document Released: 06/20/2001 Document Revised: 11/10/2014 Document Reviewed: 11/10/2014 Elsevier Interactive Patient Education 2016 [employer]. Smoking Hazards Smoking cigarettes is extremely bad for your health. Tobacco smoke has over 200 known poisons in it. It contains the poisonous gases nitrogen oxide and carbon monoxide. There are over 60  chemicals in tobacco smoke that cause cancer. Some of the chemicals found in cigarette smoke include:   Cyanide.   Benzene.   Formaldehyde.   Methanol (wood alcohol).   Acetylene (fuel used in welding torches).   Ammonia.  Even smoking lightly shortens your life expectancy by several years. You can greatly reduce the risk of medical problems for you and your family by stopping now. Smoking is the most preventable cause of death and disease in our society. Within days of quitting smoking, your circulation improves, you decrease the risk of having a heart attack, and your lung capacity improves. There may be some increased phlegm in the first few days after quitting, and it may take months for your lungs to clear up completely. Quitting for 10 years reduces your risk of developing lung cancer to almost that of a nonsmoker.  WHAT ARE THE RISKS OF SMOKING? Cigarette smokers have an increased risk of many serious medical problems, including:  Lung cancer.   Lung disease (such as pneumonia, bronchitis,  and emphysema).   Heart attack and chest pain due to the heart not getting enough oxygen (angina).   Heart disease and peripheral blood vessel disease.   Hypertension.   Stroke.   Oral cancer (cancer of the lip, mouth, or voice box).   Bladder cancer.   Pancreatic cancer.   Cervical cancer.   Pregnancy complications, including premature birth.   Stillbirths and smaller newborn babies, birth defects, and genetic damage to sperm.   Early menopause.   Lower estrogen level for women.   Infertility.   Facial wrinkles.   Blindness.   Increased risk of broken bones (fractures).   Senile dementia.   Stomach ulcers and internal bleeding.   Delayed wound healing and increased risk of complications during surgery. Because of secondhand smoke exposure, children of smokers have an increased risk of the following:   Sudden infant death syndrome (SIDS).    Respiratory infections.   Lung cancer.   Heart disease.   Ear infections.  WHY IS SMOKING ADDICTIVE? Nicotine is the chemical agent in tobacco that is capable of causing addiction or dependence. When you smoke and inhale, nicotine is absorbed rapidly into the bloodstream through your lungs. Both inhaled and noninhaled nicotine may be addictive.  WHAT ARE THE BENEFITS OF QUITTING?  There are many health benefits to quitting smoking. Some are:   The likelihood of developing cancer and heart disease decreases. Health improvements are seen almost immediately.   Blood pressure, pulse rate, and breathing patterns start returning to normal soon after quitting.   People who quit may see an improvement in their overall quality of life.  HOW DO YOU QUIT SMOKING? Smoking is an addiction with both physical and psychological effects, and longtime habits can be hard to change. Your health care provider can recommend:  Programs and community resources, which may include group support, education, or therapy.  Replacement products, such as patches, gum, and nasal sprays. Use these products only as directed. Do not replace cigarette smoking with electronic cigarettes (commonly called e-cigarettes). The safety of e-cigarettes is unknown, and some may contain harmful chemicals. FOR MORE INFORMATION  American Lung Association: www.lung.org  American Cancer Society: www.cancer.org   This information is not intended to replace advice given to you by your health care provider. Make sure you discuss any questions you have with your health care provider.   Document Released: 08/03/2004 Document Revised: 04/16/2013 Document Reviewed: 12/16/2012 Elsevier Interactive Patient Education 2016 [employer]. Secondhand Smoke WHAT IS SECONDHAND SMOKE? Secondhand smoke is smoke that comes from burning tobacco. It could be the smoke from a cigarette, a pipe, or a cigar. Even if you are not the one  smoking, secondhand smoke exposes you to the dangers of smoking. This is called involuntary, or passive, smoking. There are two types of secondhand smoke:  Sidestream smoke is the smoke that comes off the lighted end of a cigarette, pipe, or cigar.  This type of smoke has the highest amount of cancer-causing agents (carcinogens).  The particles in sidestream smoke are smaller. They get into your lungs more easily.  Mainstream smoke is the smoke that is exhaled by a person who is smoking.  This type of smoke is also dangerous to your health. HOW CAN SECONDHAND SMOKE AFFECT MY HEALTH? Studies show that there is no safe level of secondhand smoke. This smoke contains thousands of chemicals. At least 69 of them are known to cause cancer. Secondhand smoke can also cause many other health problems. It has been  linked to:  Lung cancer.  Cancer of the voice box (larynx) or throat.  Cancer of the sinuses.  Brain cancer.  Bladder cancer.  Stomach cancer.  Breast cancer.  White blood cell cancers (lymphoma and leukemia).  Brain and liver tumors in children.  Heart disease and stroke in adults.  Pregnancy loss (miscarriage).  Diseases in children, such as:  Asthma.  Lung infections.  Ear infections.  Sudden infant death syndrome (SIDS).  Slow growth. WHERE CAN I BE AT RISK FOR EXPOSURE TO SECONDHAND SMOKE?   For adults, the workplace is the main source of exposure to secondhand smoke.  Your workplace should have a policy separating smoking areas from nonsmoking areas.  Smoking areas should have a system for ventilating and cleaning the air.  For children, the home may be the most dangerous place for exposure to secondhand smoke.  Children who live in apartment buildings may be at risk from smoke drifting from hallways or other people's homes.  For everyone, many public places are possible sources of exposure to secondhand smoke.  These places include restaurants,  shopping centers, and parks. HOW CAN I REDUCE MY RISK FOR EXPOSURE TO SECONDHAND SMOKE? The most important thing you can do is not smoke. Discourage family members from smoking. Other ways to reduce exposure for you and your family include the following:  Keep your home smoke free.  Make sure your child care providers do not smoke.  Warn your child about the dangers of smoking and secondhand smoke.  Do not allow smoking in your car. When someone smokes in a car, all the damaging chemicals from the smoke are confined in a small area.  Avoid public places where smoking is allowed.   This information is not intended to replace advice given to you by your health care provider. Make sure you discuss any questions you have with your health care provider.   Document Released: 08/03/2004 Document Revised: 07/17/2014 Document Reviewed: 10/10/2013 Elsevier Interactive Patient Education [employer]

## 2015-09-29 NOTE — Assessment & Plan Note (Signed)
Last known EF was 30% in 2009. Pt has been out of all his medication and has not followed up with cardiology in 1 year. Concerned his SOB may be worsening CHF vs COPD exacerbation. Refilled current medications x 2 mos until he can see cardiology.  Have made pt appt for April 2017 to see Dr. Mariah MillingGollan.

## 2015-09-29 NOTE — Assessment & Plan Note (Signed)
Depressed ejection fraction on previous cardiac catheterization and by prior echocardiogram several years ago Chronic shortness of breath, echocardiogram has been ordered to estimated ejection fraction Recommended that he stay on his Lasix for any leg edema weight gain or abdominal bloating

## 2015-09-29 NOTE — Assessment & Plan Note (Signed)
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

## 2015-09-29 NOTE — Assessment & Plan Note (Signed)
We have encouraged him to continue to work on weaning his cigarettes and smoking cessation. He will continue to work on this and does not want any assistance with chantix.  

## 2015-09-29 NOTE — Patient Instructions (Addendum)
I made you an appt to see Dr. Mariah MillingGollan at 1140 AM on Today [facility] [place].   I have refilled your medications to get your through until you can see your cardiologist.  I do want to see you in 2 weeks to recheck your blood pressure.   It is important that we see you frequently to monitor your health.  Please take your anitbiotic twice daily for 5 days.  Use Mucinex DM to help ease your congestion. Also take Flonase once daily to help with your nasal symptoms.  You can use supportive care at home to help with your symptoms. I have sent Mucinex DM to your pharmacy to help break up the congestion and soothe your cough. You can takes this twice daily.  I Honey is a natural cough suppressant- so add it to your tea in the morning.  If you have a humidifer, set that up in your bedroom at night.   Please seek immediate medical attention if you develop shortness of breath not relieve by inhaler, chest pain/tightness, fever > 103 F, swelling in your feet or your legs, weight gain > 5 lbs over night.  or other concerning symptoms.

## 2015-09-29 NOTE — Assessment & Plan Note (Signed)
Recommended smoking cessation Emphysema likely contributing to chronic underlying shortness of breath

## 2015-09-29 NOTE — Assessment & Plan Note (Signed)
Elevated BP today. Pt has been out of all medications. Refilled lisinopril. Recheck in 2 weeks for lab work and full visit.

## 2015-09-29 NOTE — Progress Notes (Signed)
Patient ID: Blake [name], male    DOB: 06-02-1956, 60 y.o.   MRN: [id number]  HPI Comments: Blake [name] is a 60 year old gentleman with long history of smoking who continues to smoke at least one pack per day, hypertension, hepatitis, schizophrenia, coronary artery disease, cardiac catheterization October 2009 showing ejection fraction 30%, severe anterolateral and severe apical hypokinesis, three-vessel coronary artery disease, bypass surgery after MI in 2012, shortness of breath on previous clinic visits, Notes indicate he was in prison for 2 years and had no medications, May 2015 got out of prison. He had cardiac catheterization for chest tightness in 2016, this showed occluded vein graft to the OM and PDA He presents today for follow-up of his coronary artery disease  Wife reports that he tries to stay on his medications, refills recently by primary care (seen earlier today) Denies any significant chest pain but does have his chronic shortness of breath on exertion. He continues to smoke one pack per day, not interested in quitting Reports the patches do not work for him Leads a busy lifestyle, fixes motors, works as a [employer] Denies any lightheadedness, dizziness, orthostasis, leg edema  EKG on today's visit shows normal sinus rhythm with rate 69 bpm, diffuse T-wave abnormality  Other past medical history He reports that he was doing well following bypass surgery in 2012. Beginning of 2016 he developed worsening shortness of breath. He has had some lower extremity edema.  He does report having occasional chest pain, takes nitroglycerin when necessary.   Recent blood work reviewed with him showing total cholesterol 126, LDL 82, HDL 33 Normal creatinine 0.92, normal LFTs, PSA 0.7 Hepatitis C virus antibody positive, hepatitis B core antibody positive,    No Known Allergies  Outpatient Encounter Prescriptions as of 09/29/2015  Medication Sig  . albuterol (PROVENTIL  HFA;VENTOLIN HFA) 108 (90 Base) MCG/ACT inhaler Inhale 2 puffs into the lungs every 4 (four) hours as needed for wheezing or shortness of breath.  Marland Kitchen. amoxicillin-clavulanate (AUGMENTIN) 875-125 MG tablet Take 1 tablet by mouth 2 (two) times daily.  Marland Kitchen. aspirin 81 MG chewable tablet Chew 1 tablet (81 mg total) by mouth daily.  . clopidogrel (PLAVIX) 75 MG tablet Take 1 tablet (75 mg total) by mouth daily.  Marland Kitchen. dextromethorphan-guaiFENesin (MUCINEX DM) 30-600 MG 12hr tablet Take 1 tablet by mouth 2 (two) times daily.  . fluticasone (FLONASE) 50 MCG/ACT nasal spray Place 2 sprays into both nostrils daily.  . furosemide (LASIX) 20 MG tablet Take 1 tablet (20 mg total) by mouth daily as needed.  Marland Kitchen. lisinopril (PRINIVIL,ZESTRIL) 10 MG tablet Take 1 tablet (10 mg total) by mouth daily. Reported on 09/29/2015  . loratadine (CLARITIN) 10 MG tablet Take 10 mg by mouth daily. Reported on 09/29/2015  . metoprolol succinate (TOPROL XL) 50 MG 24 hr tablet Take 1 tablet (50 mg total) by mouth daily. Take with or immediately following a meal.  . nitroGLYCERIN (NITROSTAT) 0.4 MG SL tablet Place 0.4 mg under the tongue. Reported on 09/29/2015  . SUBOXONE 8-2 MG FILM PLACE 1 FILM UNDER THE TONGUE TWICE A DAY   No facility-administered encounter medications on file as of 09/29/2015.    Past Medical History  Diagnosis Date  . Coronary artery disease   . Depression   . Acute headache   . Hypertension   . Hyperlipidemia   . MI (myocardial infarction) (HCC)     x 2    Past Surgical History  Procedure Laterality Date  . Knee surgery  right knee   . Cardiac catheterization    . Coronary angioplasty with stent placement  2009  . Coronary artery bypass graft  2012    [facility]    Social History  reports that he has been smoking Cigarettes.  He has a 42 pack-year smoking history. He does not have any smokeless tobacco history on file. He reports that he does not drink alcohol or use illicit drugs.  Family  History family history includes Hypertension in his father, mother, sister, and sister.   Review of Systems  Constitutional: Negative.   Respiratory: Positive for shortness of breath.   Cardiovascular: Negative.   Gastrointestinal: Negative.   Musculoskeletal: Negative.   Skin: Negative.   Neurological: Negative.   Hematological: Negative.   Psychiatric/Behavioral: Negative.   All other systems reviewed and are negative.   BP 140/78 mmHg  Pulse 69  Ht  (1.702 m)  Wt 164 lb (74.39 kg)  BMI 25.68 kg/m2  Physical Exam  Constitutional: He is oriented to person, place, and time. He appears well-developed and well-nourished.  HENT:  Head: Normocephalic.  Nose: Nose normal.  Mouth/Throat: Oropharynx is clear and moist.  Eyes: Conjunctivae are normal. Pupils are equal, round, and reactive to light.  Neck: Normal range of motion. Neck supple. No JVD present.  Cardiovascular: Normal rate, regular rhythm, S1 normal, S2 normal, normal heart sounds and intact distal pulses.  Exam reveals no gallop and no friction rub.   No murmur heard. Pulmonary/Chest: Effort normal. No respiratory distress. He has decreased breath sounds. He has no wheezes. He has no rales. He exhibits no tenderness.  Abdominal: Soft. Bowel sounds are normal. He exhibits no distension. There is no tenderness.  Musculoskeletal: Normal range of motion. He exhibits no edema or tenderness.  Lymphadenopathy:    He has no cervical adenopathy.  Neurological: He is alert and oriented to person, place, and time. Coordination normal.  Skin: Skin is warm and dry. No rash noted. No erythema.  Psychiatric: He has a normal mood and affect. His behavior is normal. Judgment and thought content normal.      Assessment and Plan   Nursing note and vitals reviewed.

## 2015-10-14 ENCOUNTER — Encounter: Payer: Self-pay | Admitting: Family Medicine

## 2015-10-14 ENCOUNTER — Ambulatory Visit (INDEPENDENT_AMBULATORY_CARE_PROVIDER_SITE_OTHER): Payer: Medicaid Other | Admitting: Family Medicine

## 2015-10-14 ENCOUNTER — Telehealth: Payer: Self-pay | Admitting: *Deleted

## 2015-10-14 VITALS — BP 136/72 | HR 59 | Temp 97.6°F | Resp 16 | Ht 67.0 in | Wt 160.0 lb

## 2015-10-14 DIAGNOSIS — I5022 Chronic systolic (congestive) heart failure: Secondary | ICD-10-CM

## 2015-10-14 DIAGNOSIS — R0602 Shortness of breath: Secondary | ICD-10-CM

## 2015-10-14 DIAGNOSIS — I10 Essential (primary) hypertension: Secondary | ICD-10-CM | POA: Diagnosis not present

## 2015-10-14 DIAGNOSIS — K759 Inflammatory liver disease, unspecified: Secondary | ICD-10-CM

## 2015-10-14 DIAGNOSIS — Z720 Tobacco use: Secondary | ICD-10-CM | POA: Diagnosis not present

## 2015-10-14 DIAGNOSIS — I25118 Atherosclerotic heart disease of native coronary artery with other forms of angina pectoris: Secondary | ICD-10-CM

## 2015-10-14 DIAGNOSIS — J432 Centrilobular emphysema: Secondary | ICD-10-CM | POA: Diagnosis not present

## 2015-10-14 MED ORDER — FLUTICASONE-SALMETEROL 250-50 MCG/DOSE IN AEPB
1.0000 | INHALATION_SPRAY | Freq: Two times a day (BID) | RESPIRATORY_TRACT | Status: DC
Start: 2015-10-14 — End: 2018-06-24

## 2015-10-14 MED ORDER — TIOTROPIUM BROMIDE MONOHYDRATE 18 MCG IN CAPS: 18.0000 ug | ORAL_CAPSULE | Freq: Every day | RESPIRATORY_TRACT | Status: DC

## 2015-10-14 MED ORDER — PREDNISONE 20 MG PO TABS: 40.0000 mg | ORAL_TABLET | Freq: Every day | ORAL | Status: DC

## 2015-10-14 NOTE — Assessment & Plan Note (Signed)
Followed by cardiology.  Avoid chantix at this time for smoking cessation due to cardiac history.

## 2015-10-14 NOTE — Assessment & Plan Note (Signed)
Likely 2/2 COPD or worsening heart failure. Spirometry next visit.

## 2015-10-14 NOTE — Assessment & Plan Note (Signed)
Followed by cardiology. Pt has appt for Echo on 4/18. Encouraged him to get labwork ordered by cardiologist.

## 2015-10-14 NOTE — Progress Notes (Signed)
Subjective:    Patient ID: Blake [name], male    DOB: 01-19-1956, 60 y.o.   MRN: [id number]  HPI: Blake [name] is a 60 y.o. male presenting on 10/14/2015 for Hypertension and Shortness of Breath   HPI   SOB - No CP. Says he is "breathing hard" when he moves around and when he sits down, but it doesn't last very long. Happens 2-3 times per week. Uses albuterol inhaler and it helps. Does not wake up feeling like he can't breath. No cough. Smokes 2 ppd. Wants to quit.  Known CHF and COPD. Recently seen cardiology. Has echo scheduled for 4/18.   HTN - No CP, HA/Dizziness. No swelling. No vision changes. Does not check BP at home. Has been taking metoprolol, lisinopril and lasix every day. Did not take this morning. Does not check weight daily.  Hepatitis: Found to have Hep B and C in labwork. States he was treated for Hep C but not sure if he finished treatment. No jaundice. No swelling.   Past Medical History  Diagnosis Date  . Coronary artery disease   . Depression   . Acute headache   . Hypertension   . Hyperlipidemia   . MI (myocardial infarction) (HCC)     x 2    Current Outpatient Prescriptions on File Prior to Visit  Medication Sig  . albuterol (PROVENTIL HFA;VENTOLIN HFA) 108 (90 Base) MCG/ACT inhaler Inhale 2 puffs into the lungs every 4 (four) hours as needed for wheezing or shortness of breath.  Marland Kitchen. amoxicillin-clavulanate (AUGMENTIN) 875-125 MG tablet Take 1 tablet by mouth 2 (two) times daily.  Marland Kitchen. aspirin 81 MG chewable tablet Chew 1 tablet (81 mg total) by mouth daily.  . clopidogrel (PLAVIX) 75 MG tablet Take 1 tablet (75 mg total) by mouth daily.  Marland Kitchen. dextromethorphan-guaiFENesin (MUCINEX DM) 30-600 MG 12hr tablet Take 1 tablet by mouth 2 (two) times daily.  . fluticasone (FLONASE) 50 MCG/ACT nasal spray Place 2 sprays into both nostrils daily.  . furosemide (LASIX) 20 MG tablet Take 1 tablet (20 mg total) by mouth daily as needed.  Marland Kitchen. lisinopril  (PRINIVIL,ZESTRIL) 10 MG tablet Take 1 tablet (10 mg total) by mouth daily. Reported on 09/29/2015  . loratadine (CLARITIN) 10 MG tablet Take 10 mg by mouth daily. Reported on 09/29/2015  . metoprolol succinate (TOPROL XL) 50 MG 24 hr tablet Take 1 tablet (50 mg total) by mouth daily. Take with or immediately following a meal.  . nitroGLYCERIN (NITROSTAT) 0.4 MG SL tablet Place 0.4 mg under the tongue. Reported on 09/29/2015  . SUBOXONE 8-2 MG FILM PLACE 1 FILM UNDER THE TONGUE TWICE A DAY   No current facility-administered medications on file prior to visit.    Review of Systems  Constitutional: Negative for fever and chills.  HENT: Negative.  Negative for hearing loss, rhinorrhea, sinus pressure, sneezing and sore throat.   Eyes: Negative for pain, itching and visual disturbance.  Respiratory: Positive for shortness of breath. Negative for cough, chest tightness and wheezing.   Cardiovascular: Negative for chest pain, palpitations and leg swelling.  Gastrointestinal: Negative for nausea, vomiting, abdominal pain and abdominal distention.  Endocrine: Negative.   Genitourinary: Negative for dysuria, urgency, discharge, difficulty urinating, penile pain and testicular pain.  Musculoskeletal: Negative for myalgias, back pain, joint swelling and arthralgias.  Skin: Negative.  Negative for color change.  Neurological: Negative for dizziness, weakness, light-headedness, numbness and headaches.  Psychiatric/Behavioral: Negative for behavioral problems, sleep disturbance, dysphoric mood and agitation.  Per HPI unless specifically indicated above     Objective:    BP 136/72 mmHg  Pulse 59  Temp(Src) 97.6 F (36.4 C) (Oral)  Resp 16  Ht  (1.702 m)  Wt 160 lb (72.576 kg)  BMI 25.05 kg/m2  SpO2 100%  Wt Readings from Last 3 Encounters:  10/14/15 160 lb (72.576 kg)  09/29/15 164 lb (74.39 kg)  09/29/15 164 lb (74.39 kg)    Physical Exam  Constitutional: He is oriented to person,  place, and time. He appears well-developed and well-nourished. No distress.  HENT:  Head: Normocephalic and atraumatic.  Neck: Normal range of motion. Neck supple. Carotid bruit is not present. No thyromegaly present.  Cardiovascular: Normal rate, regular rhythm, normal heart sounds and intact distal pulses.  Exam reveals no gallop and no friction rub.   No murmur heard. Pulmonary/Chest: Effort normal. No accessory muscle usage. No respiratory distress. He has wheezes (expiratory).  Abdominal: Soft. Bowel sounds are normal. He exhibits no distension. There is no tenderness. There is no rebound.  Musculoskeletal: Normal range of motion. He exhibits no edema or tenderness.  Neurological: He is alert and oriented to person, place, and time. He has normal reflexes.  Skin: Skin is warm and dry. No rash noted. No erythema.  Psychiatric: He has a normal mood and affect. His behavior is normal. Thought content normal.   Results for orders placed or performed during the hospital encounter of 09/01/15  Basic metabolic panel  Result Value Ref Range   Sodium 141 135 - 145 mmol/L   Potassium 3.9 3.5 - 5.1 mmol/L   Chloride 109 101 - 111 mmol/L   CO2 27 22 - 32 mmol/L   Glucose, Bld 112 (H) 65 - 99 mg/dL   BUN 13 6 - 20 mg/dL   Creatinine, Ser 2.95 0.61 - 1.24 mg/dL   Calcium 8.9 8.9 - 62.1 mg/dL   GFR calc non Af Amer >60 >60 mL/min   GFR calc Af Amer >60 >60 mL/min   Anion gap 5 5 - 15  CBC  Result Value Ref Range   WBC 5.2 3.8 - 10.6 K/uL   RBC 4.69 4.40 - 5.90 MIL/uL   Hemoglobin 14.3 13.0 - 18.0 g/dL   HCT 30.8 65.7 - 84.6 %   MCV 92.5 80.0 - 100.0 fL   MCH 30.5 26.0 - 34.0 pg   MCHC 33.0 32.0 - 36.0 g/dL   RDW 96.2 95.2 - 84.1 %   Platelets 207 150 - 440 K/uL  Troponin I  Result Value Ref Range   Troponin I <0.03 <0.031 ng/mL  Troponin I  Result Value Ref Range   Troponin I <0.03 <0.031 ng/mL      Assessment & Plan:   Problem List Items Addressed This Visit       Cardiovascular and Mediastinum   Coronary artery disease involving native coronary artery of native heart with other form of angina pectoris (HCC) - Primary    Followed by cardiology.  Avoid chantix at this time for smoking cessation due to cardiac history.       Chronic systolic CHF (congestive heart failure) (HCC)    Followed by cardiology. Pt has appt for Echo on 4/18. Encouraged him to get labwork ordered by cardiologist.       Essential (primary) hypertension    Controlled on recheck. Continue current medication. Last BMP WNL. Encouraged heart healthy diet and smoking cessation.         Respiratory  Emphysema of lung (HCC)    Likely cause of SOB but could be worsening CHF- echo on 4/18. CXR normal. Short course of prednisone to help with breathing. Start inhaled steroid- advair BID. Start Spirva to help with symptoms. Spirometry next visit. Pt told to bring inhalers. Alarm symptoms reviewed.       Relevant Medications   Fluticasone-Salmeterol (ADVAIR) 250-50 MCG/DOSE AEPB   tiotropium (SPIRIVA HANDIHALER) 18 MCG inhalation capsule   predniSONE (DELTASONE) 20 MG tablet     Digestive   Hepatitis    +Hep B and +Hep C on labs last year. Did not get follow-up labwork and did not go to GI. Check viral load. Plan to refer to GI for further work-up if chronically infected.       Relevant Orders   Hepatitis C Antibody   HCV RNA quant     Other   SOB (shortness of breath)    Likely 2/2 COPD or worsening heart failure. Spirometry next visit.       Current tobacco use    2 packs per day. Encouraged pt to quit for continued health. Discussed e-cigarettes as option. Declined nicotine patches.           Meds ordered this encounter  Medications  . Fluticasone-Salmeterol (ADVAIR) 250-50 MCG/DOSE AEPB    Sig: Inhale 1 puff into the lungs 2 (two) times daily.    Dispense:  60 each    Refill:  11    Order Specific Question:  Supervising Provider    Answer:  Janeann Forehand  [phone]  . tiotropium (SPIRIVA HANDIHALER) 18 MCG inhalation capsule    Sig: Place 1 capsule (18 mcg total) into inhaler and inhale daily.    Dispense:  30 capsule    Refill:  12    Order Specific Question:  Supervising Provider    Answer:  Janeann Forehand [phone]  . predniSONE (DELTASONE) 20 MG tablet    Sig: Take 2 tablets (40 mg total) by mouth daily with breakfast.    Dispense:  6 tablet    Refill:  0    Order Specific Question:  Supervising Provider    Answer:  Janeann Forehand [[id number]]      Follow up plan: Return in about 4 weeks (around 11/11/2015) for Spirometry.

## 2015-10-14 NOTE — Assessment & Plan Note (Signed)
Likely cause of SOB but could be worsening CHF- echo on 4/18. CXR normal. Short course of prednisone to help with breathing. Start inhaled steroid- advair BID. Start Spirva to help with symptoms. Spirometry next visit. Pt told to bring inhalers. Alarm symptoms reviewed.

## 2015-10-14 NOTE — Assessment & Plan Note (Signed)
+  Hep B and +Hep C on labs last year. Did not get follow-up labwork and did not go to GI. Check viral load. Plan to refer to GI for further work-up if chronically infected.

## 2015-10-14 NOTE — Patient Instructions (Addendum)
Breathing: Let's try a short course of prednisone to help with your breathing. Take 2 pills every morning for 3 days.  I am also starting you on 2 inhaler called the Advair Diskus and Spirva to help with your breathing. Barnetta ChapelSpirva is once daily and Advair is twice daily. I want you to come back in about 1 month to do a breathing test. Bring your albuterol inhaler with you.  Please seek immediate medical attention at ER or Urgent Care if you develop: Chest pain, pressure or tightness. Shortness of breath accompanied by nausea or diaphoresis Visual changes Numbness or tingling on one side of the body Facial droop Altered mental status Or any concerning symptoms.

## 2015-10-14 NOTE — Assessment & Plan Note (Signed)
Controlled on recheck. Continue current medication. Last BMP WNL. Encouraged heart healthy diet and smoking cessation.

## 2015-10-14 NOTE — Assessment & Plan Note (Signed)
2 packs per day. Encouraged pt to quit for continued health. Discussed e-cigarettes as option. Declined nicotine patches.

## 2015-10-14 NOTE — Telephone Encounter (Signed)
[place] [employer] form completed and placed on [facility] desk for signature.

## 2015-10-20 NOTE — Telephone Encounter (Signed)
PA approved auth # [id number] Valid thru 10/14/2016.

## 2015-10-25 ENCOUNTER — Ambulatory Visit: Payer: Medicaid Other | Admitting: Cardiovascular Disease

## 2015-10-26 ENCOUNTER — Encounter: Payer: Self-pay | Admitting: *Deleted

## 2015-10-26 ENCOUNTER — Other Ambulatory Visit: Payer: Medicaid Other

## 2016-01-01 ENCOUNTER — Emergency Department
Admission: EM | Admit: 2016-01-01 | Discharge: 2016-01-01 | Disposition: A | Discharge status: Home / Self Care | Payer: Medicaid Other | Attending: Emergency Medicine | Admitting: Emergency Medicine

## 2016-01-01 ENCOUNTER — Encounter: Payer: Self-pay | Admitting: Emergency Medicine

## 2016-01-01 ENCOUNTER — Emergency Department: Payer: Medicaid Other

## 2016-01-01 DIAGNOSIS — I2510 Atherosclerotic heart disease of native coronary artery without angina pectoris: Secondary | ICD-10-CM | POA: Diagnosis not present

## 2016-01-01 DIAGNOSIS — F329 Major depressive disorder, single episode, unspecified: Secondary | ICD-10-CM | POA: Diagnosis not present

## 2016-01-01 DIAGNOSIS — R0789 Other chest pain: Secondary | ICD-10-CM | POA: Diagnosis present

## 2016-01-01 DIAGNOSIS — F17210 Nicotine dependence, cigarettes, uncomplicated: Secondary | ICD-10-CM | POA: Insufficient documentation

## 2016-01-01 DIAGNOSIS — E876 Hypokalemia: Secondary | ICD-10-CM

## 2016-01-01 DIAGNOSIS — Z951 Presence of aortocoronary bypass graft: Secondary | ICD-10-CM | POA: Insufficient documentation

## 2016-01-01 DIAGNOSIS — I110 Hypertensive heart disease with heart failure: Secondary | ICD-10-CM | POA: Insufficient documentation

## 2016-01-01 DIAGNOSIS — Z85038 Personal history of other malignant neoplasm of large intestine: Secondary | ICD-10-CM | POA: Diagnosis not present

## 2016-01-01 DIAGNOSIS — J441 Chronic obstructive pulmonary disease with (acute) exacerbation: Secondary | ICD-10-CM | POA: Insufficient documentation

## 2016-01-01 DIAGNOSIS — E785 Hyperlipidemia, unspecified: Secondary | ICD-10-CM | POA: Diagnosis not present

## 2016-01-01 DIAGNOSIS — Z7982 Long term (current) use of aspirin: Secondary | ICD-10-CM | POA: Insufficient documentation

## 2016-01-01 DIAGNOSIS — I5022 Chronic systolic (congestive) heart failure: Secondary | ICD-10-CM | POA: Diagnosis not present

## 2016-01-01 DIAGNOSIS — I252 Old myocardial infarction: Secondary | ICD-10-CM | POA: Insufficient documentation

## 2016-01-01 DIAGNOSIS — R079 Chest pain, unspecified: Secondary | ICD-10-CM

## 2016-01-01 LAB — BASIC METABOLIC PANEL
Anion gap: 4 — ABNORMAL LOW (ref 5–15)
BUN: 12 mg/dL (ref 6–20)
CALCIUM: 8.5 mg/dL — AB (ref 8.9–10.3)
CO2: 26 mmol/L (ref 22–32)
CREATININE: 0.96 mg/dL (ref 0.61–1.24)
Chloride: 112 mmol/L — ABNORMAL HIGH (ref 101–111)
GFR calc non Af Amer: >60 mL/min (ref >60)
GLUCOSE: 131 mg/dL — AB (ref 65–99)
Potassium: 3.2 mmol/L — ABNORMAL LOW (ref 3.5–5.1)
Sodium: 142 mmol/L (ref 135–145)

## 2016-01-01 LAB — CBC
HCT: 39.9 % — ABNORMAL LOW (ref 40.0–52.0)
Hemoglobin: 13.4 g/dL (ref 13.0–18.0)
MCH: 30.8 pg (ref 26.0–34.0)
MCHC: 33.6 g/dL (ref 32.0–36.0)
MCV: 91.7 fL (ref 80.0–100.0)
PLATELETS: 171 10*3/uL (ref 150–440)
RBC: 4.35 MIL/uL — ABNORMAL LOW (ref 4.40–5.90)
RDW: 13.8 % (ref 11.5–14.5)
WBC: 4.9 10*3/uL (ref 3.8–10.6)

## 2016-01-01 LAB — TROPONIN I: TROPONIN I: 0.03 ng/mL (ref <0.031)

## 2016-01-01 MED ORDER — POTASSIUM CHLORIDE CRYS ER 20 MEQ PO TBCR
40.0000 meq | EXTENDED_RELEASE_TABLET | Freq: Once | ORAL | Status: AC
  Administered 2016-01-01: 40 meq via ORAL
  Filled 2016-01-01: qty 2

## 2016-01-01 MED ORDER — ASPIRIN 81 MG PO CHEW
324.0000 mg | CHEWABLE_TABLET | Freq: Once | ORAL | Status: AC
  Administered 2016-01-01: 324 mg via ORAL
  Filled 2016-01-01: qty 4

## 2016-01-01 MED ORDER — PREDNISONE 10 MG PO TABS: 50.0000 mg | ORAL_TABLET | Freq: Every day | ORAL | Status: DC

## 2016-01-01 MED ORDER — IPRATROPIUM-ALBUTEROL 0.5-2.5 (3) MG/3ML IN SOLN
3.0000 mL | Freq: Once | RESPIRATORY_TRACT | Status: AC
  Administered 2016-01-01: 3 mL via RESPIRATORY_TRACT
  Filled 2016-01-01: qty 3

## 2016-01-01 MED ORDER — ALBUTEROL SULFATE HFA 108 (90 BASE) MCG/ACT IN AERS: 2.0000 | INHALATION_SPRAY | Freq: Four times a day (QID) | RESPIRATORY_TRACT | Status: DC | PRN

## 2016-01-01 MED ORDER — PREDNISONE 20 MG PO TABS
50.0000 mg | ORAL_TABLET | Freq: Once | ORAL | Status: AC
  Administered 2016-01-01: 50 mg via ORAL
  Filled 2016-01-01: qty 2

## 2016-01-01 NOTE — Discharge Instructions (Signed)
You have evaluated for shortness of breath and chest discomfort, which I suspect is coming from COPD exacerbation/wheezing.  Given your heart conditions, I do want you to follow up closely with Dr. Mariah MillingGollan, cardiologist -- call on Monday to make appointment for next available.  Return to emergency department for any worsening condition including trouble breathing, shortness breath, palpitations, dizziness or passing out, or any other symptoms concerning to you.   Chronic Obstructive Pulmonary Disease Exacerbation Chronic obstructive pulmonary disease (COPD) is a common lung condition in which airflow from the lungs is limited. COPD is a general term that can be used to describe many different lung problems that limit airflow, including chronic bronchitis and emphysema. COPD exacerbations are episodes when breathing symptoms become much worse and require extra treatment. Without treatment, COPD exacerbations can be life threatening, and frequent COPD exacerbations can cause further damage to your lungs. CAUSES  Respiratory infections.  Exposure to smoke.  Exposure to air pollution, chemical fumes, or dust. Sometimes there is no apparent cause or trigger. RISK FACTORS  Smoking cigarettes.  Older age.  Frequent prior COPD exacerbations. SIGNS AND SYMPTOMS  Increased coughing.  Increased thick spit (sputum) production.  Increased wheezing.  Increased shortness of breath.  Rapid breathing.  Chest tightness. DIAGNOSIS Your medical history, a physical exam, and tests will help your health care provider make a diagnosis. Tests may include:  A chest X-ray.  Basic lab tests.  Sputum testing.  An arterial blood gas test. TREATMENT Depending on the severity of your COPD exacerbation, you may need to be admitted to a hospital for treatment. Some of the treatments commonly used to treat COPD exacerbations are:   Antibiotic medicines.  Bronchodilators. These are drugs that expand  the air passages. They may be given with an inhaler or nebulizer. Spacer devices may be needed to help improve drug delivery.  Corticosteroid medicines.  Supplemental oxygen therapy.  Airway clearing techniques, such as noninvasive ventilation (NIV) and positive expiratory pressure (PEP). These provide respiratory support through a mask or other noninvasive device. HOME CARE INSTRUCTIONS  Do not smoke. Quitting smoking is very important to prevent COPD from getting worse and exacerbations from happening as often.  Avoid exposure to all substances that irritate the airway, especially to tobacco smoke.  If you were prescribed an antibiotic medicine, finish it all even if you start to feel better.  Take all medicines as directed by your health care provider.It is important to use correct technique with inhaled medicines.  Drink enough fluids to keep your urine clear or pale yellow (unless you have a medical condition that requires fluid restriction).  Use a cool mist vaporizer. This makes it easier to clear your chest when you cough.  If you have a home nebulizer and oxygen, continue to use them as directed.  Maintain all necessary vaccinations to prevent infections.  Exercise regularly.  Eat a healthy diet.  Keep all follow-up appointments as directed by your health care provider. SEEK IMMEDIATE MEDICAL CARE IF:  You have worsening shortness of breath.  You have trouble talking.  You have severe chest pain.  You have blood in your sputum.  You have a fever.  You have weakness, vomit repeatedly, or faint.  You feel confused.  You continue to get worse. MAKE SURE YOU:  Understand these instructions.  Will watch your condition.  Will get help right away if you are not doing well or get worse.   This information is not intended to replace advice given  to you by your health care provider. Make sure you discuss any questions you have with your health care provider.     Document Released: 04/23/2007 Document Revised: 07/17/2014 Document Reviewed: 02/28/2013 Elsevier Interactive Patient Education [employer]2016 [employer]

## 2016-01-01 NOTE — ED Notes (Signed)
Pt presents to ED with chest pain that is described as dull. Pt reports the pain started two days ago and reports he has become increasingly short of breath since the onset of his pain. Pt states the last time he had similar symptoms he needed to have a stent placed in his heart. Pt has slight increased work of breathing noted at this time. Skin warm and dry.

## 2016-01-01 NOTE — ED Provider Notes (Signed)
[facility] Emergency Department Provider Note   ____________________________________________  Time seen:  I have reviewed the triage vital signs and the triage nursing note.  HISTORY  Chief Complaint Chest Pain and Shortness of Breath   Historian Patient and friend  HPI Blake [name] is a 60 y.o. male with a history of CAD, CHF, CaBg 2012 and stent 2016, and copd is here for shortness of breath for about 1 week with chest pressure centrally - both worse with exertion.  He reports out of inhalers.  He has been wheezing.  No sputum production.  No fevers.  Continues to smoke cigarettes.  Symptoms are moderate.  He came due to persistence of symptoms.    Past Medical History  Diagnosis Date  . Coronary artery disease   . Depression   . Acute headache   . Hypertension   . Hyperlipidemia   . MI (myocardial infarction) (HCC)     x 2    Patient Active Problem List   Diagnosis Date Noted  . Benign prostatic hyperplasia with urinary obstruction 09/29/2015  . Chest pain at rest 09/29/2015  . Cough 09/29/2015  . Current tobacco use 09/29/2015  . Clinical depression 09/29/2015  . Essential (primary) hypertension 09/29/2015  . Cephalalgia 09/29/2015  . Heart disease 09/29/2015  . Hypercholesteremia 09/29/2015  . 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 H/O acute myocardial infarction 09/29/2015  . Alveolar aeration decreased 09/29/2015  . Special screening for malignant neoplasms, colon 09/29/2015  . Other chest pain 09/16/2014  . SOB (shortness of breath) 09/16/2014  . Hepatitis 09/16/2014  . S/P CABG (coronary artery bypass graft) 09/16/2014  . Coronary artery disease involving native coronary artery of native heart with other form of angina pectoris (HCC) 09/16/2014  . Shortness of breath 09/16/2014  . Smoker 09/16/2014  . Emphysema of lung (HCC) 09/16/2014  . Chronic systolic CHF (congestive heart failure) (HCC) 09/16/2014    Past Surgical  History  Procedure Laterality Date  . Knee surgery      right knee   . Cardiac catheterization    . Coronary angioplasty with stent placement  2009  . Coronary artery bypass graft  2012    [facility]    Current Outpatient Rx  Name  Route  Sig  Dispense  Refill  . albuterol (PROVENTIL HFA;VENTOLIN HFA) 108 (90 Base) MCG/ACT inhaler   Inhalation   Inhale 2 puffs into the lungs every 6 (six) hours as needed for wheezing or shortness of breath.   1 Inhaler   0   . amoxicillin-clavulanate (AUGMENTIN) 875-125 MG tablet   Oral   Take 1 tablet by mouth 2 (two) times daily.   10 tablet   0   . aspirin 81 MG chewable tablet   Oral   Chew 1 tablet (81 mg total) by mouth daily.   30 tablet   11   . clopidogrel (PLAVIX) 75 MG tablet   Oral   Take 1 tablet (75 mg total) by mouth daily.   30 tablet   2   . dextromethorphan-guaiFENesin (MUCINEX DM) 30-600 MG 12hr tablet   Oral   Take 1 tablet by mouth 2 (two) times daily.   20 tablet   0   . fluticasone (FLONASE) 50 MCG/ACT nasal spray   Each Nare   Place 2 sprays into both nostrils daily.   16 g   11   . Fluticasone-Salmeterol (ADVAIR) 250-50 MCG/DOSE AEPB   Inhalation   Inhale 1 puff into the lungs  2 (two) times daily.   60 each   11   . furosemide (LASIX) 20 MG tablet   Oral   Take 1 tablet (20 mg total) by mouth daily as needed.   30 tablet   2   . lisinopril (PRINIVIL,ZESTRIL) 10 MG tablet   Oral   Take 1 tablet (10 mg total) by mouth daily. Reported on 09/29/2015   30 tablet   11   . loratadine (CLARITIN) 10 MG tablet   Oral   Take 10 mg by mouth daily. Reported on 09/29/2015         . metoprolol succinate (TOPROL XL) 50 MG 24 hr tablet   Oral   Take 1 tablet (50 mg total) by mouth daily. Take with or immediately following a meal.   30 tablet   2   . nitroGLYCERIN (NITROSTAT) 0.4 MG SL tablet   Sublingual   Place 0.4 mg under the tongue. Reported on 09/29/2015         . predniSONE (DELTASONE) 10 MG  tablet   Oral   Take 5 tablets (50 mg total) by mouth daily.   20 tablet   0   . SUBOXONE 8-2 MG FILM      PLACE 1 FILM UNDER THE TONGUE TWICE A DAY      0     Dispense as written.   . tiotropium (SPIRIVA HANDIHALER) 18 MCG inhalation capsule   Inhalation   Place 1 capsule (18 mcg total) into inhaler and inhale daily.   30 capsule   12     Allergies Review of patient's allergies indicates no known allergies.  Family History  Problem Relation Age of Onset  . Hypertension Mother   . Hypertension Father   . Hypertension Sister   . Hypertension Sister     Social History Social History  Substance Use Topics  . Smoking status: Current Every Day Smoker -- 1.00 packs/day for 42 years    Types: Cigarettes  . Smokeless tobacco: Never Used  . Alcohol Use: No    Review of Systems  Constitutional: Negative for fever. Eyes: Negative for visual changes. ENT: Negative for sore throat. Cardiovascular: Central chest pressure associated with wheezing, no pleuritic chest pain. Respiratory: Positive for shortness of breath. Gastrointestinal: Negative for abdominal pain, vomiting and diarrhea. Genitourinary: Negative for dysuria. Musculoskeletal: Negative for back pain. Skin: Negative for rash. Neurological: Negative for headache. 10 point Review of Systems otherwise negative ____________________________________________   PHYSICAL EXAM:  VITAL SIGNS: ED Triage Vitals  Enc Vitals Group     BP 01/01/16 0633 148/73 mmHg     Pulse Rate 01/01/16 0633 63     Resp 01/01/16 0633 22     Temp 01/01/16 0633 97.8 F (36.6 C)     Temp src --      SpO2 01/01/16 0633 97 %     Weight 01/01/16 0633 142 lb (64.411 kg)     Height 01/01/16 0633 5\' 3"  (1.6 m)     Head Cir --      Peak Flow --      Pain Score 01/01/16 0632 1     Pain Loc --      Pain Edu? --      Excl. in GC? --      Constitutional: Alert and oriented. Well appearing and in no distress. HEENT   Head:  Normocephalic and atraumatic.      Eyes: Conjunctivae are normal. PERRL. Normal extraocular movements.  Ears:         Nose: No congestion/rhinnorhea.   Mouth/Throat: Mucous membranes are moist.   Neck: No stridor. Cardiovascular/Chest: Normal rate, regular rhythm.  No murmurs, rubs, or gallops. Respiratory: Borderline tachypnea without retractions. Able to speak full sentences. Moderate end expiratory wheezing in all fields with mild ronchi bases. Gastrointestinal: Soft. No distention, no guarding, no rebound. Nontender.    Genitourinary/rectal:Deferred Musculoskeletal: Nontender with normal range of motion in all extremities. No joint effusions.  No lower extremity tenderness.  No edema. Neurologic:  Normal speech and language. No gross or focal neurologic deficits are appreciated. Skin:  Skin is warm, dry and intact. No rash noted. Psychiatric: Mood and affect are normal. Speech and behavior are normal. Patient exhibits appropriate insight and judgment.  ____________________________________________   EKG I, Governor Rooks, MD, the attending physician have personally viewed and interpreted all ECGs.  64 bpm with 1st degree av block.  Narrow qrs.  Nl axis.  Lateral twave inversions  Similar to prior ECG several months ago at Dr,. Gollan's office ____________________________________________  LABS (pertinent positives/negatives)  Labs Reviewed  BASIC METABOLIC PANEL - Abnormal; Notable for the following:    Potassium 3.2 (*)    Chloride 112 (*)    Glucose, Bld 131 (*)    Calcium 8.5 (*)    Anion gap 4 (*)    All other components within normal limits  CBC - Abnormal; Notable for the following:    RBC 4.35 (*)    HCT 39.9 (*)    All other components within normal limits  TROPONIN I    ____________________________________________  RADIOLOGY All Xrays were viewed by me. Imaging interpreted by Radiologist.  CXR:  No active cardiopulmonary  disease. __________________________________________  PROCEDURES  Procedure(s) performed: None  Critical Care performed: None  ____________________________________________   ED COURSE / ASSESSMENT AND PLAN  Pertinent labs & imaging results that were available during my care of the patient were reviewed by me and considered in my medical decision making (see chart for details).   This patient has a significant cardiac and pulmonary history in terms of CHF, CABG, stent, and COPD.  He is wheezing quite a bit and appears clinically to be having COPD exacerbation with chest pressure. His chest x-ray is clear in terms of no infiltrate, nor pulmonary edema.  He states these out of his inhaler and I will go ahead and treat him with DuoNeb here and prednisone, and likely discharge with prednisone and albuterol inhaler.  He's been having ongoing symptoms including shortness of breath with exertion and central chest pressure for about 3 days now at least and his EKG is unchanged from prior and his troponin is 0.03. I did give him potassium replacement for potassium of 3.2.  At this point it seems like his symptoms are due to COPD exacerbation I will go ahead and discharge him home. I do not have close follow-up back with his primary care physician as well as cardiology, for which he can call on Monday to make these appointments.     CONSULTATIONS:   None   Patient / Family / Caregiver informed of clinical course, medical decision-making process, and agree with plan.   I discussed return precautions, follow-up instructions, and discharged instructions with patient and/or family.   ___________________________________________   FINAL CLINICAL IMPRESSION(S) / ED DIAGNOSES   Final diagnoses:  Hypokalemia  Chest pain, unspecified  COPD exacerbation (HCC)  Note: This dictation was prepared with Dragon dictation. Any transcriptional errors that result from this  process are unintentional   Governor Rooksebecca Clessie Karras, MD 01/01/16 [phone]

## 2016-01-04 ENCOUNTER — Telehealth: Payer: Self-pay | Admitting: Cardiovascular Disease

## 2016-01-04 NOTE — Telephone Encounter (Signed)
Pt was seen in ED 01/01/16 seen for CP and SOB  Spoke to patient sister (helen) pt can come 02/03/16 to see Eula Listenyan Dunn

## 2016-02-02 ENCOUNTER — Encounter: Payer: Self-pay | Admitting: Physician Assistant

## 2016-02-04 ENCOUNTER — Encounter: Payer: Self-pay | Admitting: *Deleted

## 2016-02-04 ENCOUNTER — Ambulatory Visit: Payer: Medicaid Other | Admitting: Physician Assistant

## 2016-02-04 NOTE — Progress Notes (Deleted)
Cardiology Office Note Date:  02/04/2016  Patient ID:  Blake [name], DOB Jan 19, 1956, MRN [id number] PCP:  Fidel Levy, MD  Cardiologist:  Dr. Mariah Milling, MD  ***refresh   Chief Complaint: ***  History of Present Illness: Blake [name] is a 60 y.o. male with history of CAD s/p 4-vessel CABG (LIMA-LAD, VG-diag, VG-OM, VG-RPDA) in the setting of an MI in 2012, COPD with ongoing tobacco abuse at least 1 pack per day, HTN, hepatits B and C, schizophrenia, prior incarceration x 2 years, and LE edema who presents for ED follow up from recent ED visit on 6/24 for chest pain and SOB x 1 week prior to visit.   Prior cardaic catheterization in 04/2008 3-vessel disease with severe anterolateral and severe apical hypokinesis, EF 32%. It was recommended he seek consultation with cardiothoracic surgery for evaluation of cardiac bypass surgery. Most recent cardiac cath from 2016 in the setting of increased SOB and LE edema showed occluded SVG to OM and SVG to PDA. Occluded native RCA with collaterals from left to right. Patent LIMA to LAD and patent SVG to Diagonal. There was severe/critical disease in the mid LCx estimated at 95% at the take off of the AV groove. He underwent successful PCI/DES (Xience Apline stent) to the mid LCx with 0% residual stenosis. He was last seen by Dr. Mariah Milling on 09/29/2015 and denied any chest pain at that time. He continued to smoke and was living a busy lifestyle. Echo was ordered given his ICM and chronic SOB. Unfortunately, the patient did not have this done.   He presented to the [facility] ED on 01/01/2016 with complaints of chest pain and SOB x 1 week at that time. Symptoms were worse with exertion. He had been out of his inhalers for his COPD at that time. He was found to be hypokalemic with a potassium of 3.2 s/p repletion in the ED. CXR without active disease. EKG non-acute. Troponin negative x 1. He was noted to be wheezing on exam and was treated with nebs in the ED and  discharged.   He comes in stating ***   Past Medical History:  Diagnosis Date  . Acute headache   . COPD (chronic obstructive pulmonary disease) (HCC)   . Coronary artery disease    a. cath 2009 w/ severe 3-vessel dz, rec bypass; b. MI 2012-->4-vessel cabg; c. cath 2016: occluded VG-OM and VG-RPDA, patent LIMA-LAD and patent VG-Diag, 95% stenosis of mid LCx s/p PCI/DES 0% residual stenosis  . Depression   . Hepatitis B   . Hepatitis C   . Hyperlipidemia   . Hypertension   . MI (myocardial infarction) (HCC)    x 2  . Tobacco abuse     Past Surgical History:  Procedure Laterality Date  . CARDIAC CATHETERIZATION    . CORONARY ANGIOPLASTY WITH STENT PLACEMENT  2009  . CORONARY ARTERY BYPASS GRAFT  2012   [facility]  . KNEE SURGERY     right knee     Current Outpatient Prescriptions  Medication Sig Dispense Refill  . albuterol (PROVENTIL HFA;VENTOLIN HFA) 108 (90 Base) MCG/ACT inhaler Inhale 2 puffs into the lungs every 6 (six) hours as needed for wheezing or shortness of breath. 1 Inhaler 0  . amoxicillin-clavulanate (AUGMENTIN) 875-125 MG tablet Take 1 tablet by mouth 2 (two) times daily. 10 tablet 0  . aspirin 81 MG chewable tablet Chew 1 tablet (81 mg total) by mouth daily. 30 tablet 11  . clopidogrel (PLAVIX) 75 MG tablet Take  1 tablet (75 mg total) by mouth daily. 30 tablet 2  . dextromethorphan-guaiFENesin (MUCINEX DM) 30-600 MG 12hr tablet Take 1 tablet by mouth 2 (two) times daily. 20 tablet 0  . fluticasone (FLONASE) 50 MCG/ACT nasal spray Place 2 sprays into both nostrils daily. 16 g 11  . Fluticasone-Salmeterol (ADVAIR) 250-50 MCG/DOSE AEPB Inhale 1 puff into the lungs 2 (two) times daily. 60 each 11  . furosemide (LASIX) 20 MG tablet Take 1 tablet (20 mg total) by mouth daily as needed. 30 tablet 2  . lisinopril (PRINIVIL,ZESTRIL) 10 MG tablet Take 1 tablet (10 mg total) by mouth daily. Reported on 09/29/2015 30 tablet 11  . loratadine (CLARITIN) 10 MG tablet Take 10 mg by  mouth daily. Reported on 09/29/2015    . metoprolol succinate (TOPROL XL) 50 MG 24 hr tablet Take 1 tablet (50 mg total) by mouth daily. Take with or immediately following a meal. 30 tablet 2  . nitroGLYCERIN (NITROSTAT) 0.4 MG SL tablet Place 0.4 mg under the tongue. Reported on 09/29/2015    . predniSONE (DELTASONE) 10 MG tablet Take 5 tablets (50 mg total) by mouth daily. 20 tablet 0  . SUBOXONE 8-2 MG FILM PLACE 1 FILM UNDER THE TONGUE TWICE A DAY  0  . tiotropium (SPIRIVA HANDIHALER) 18 MCG inhalation capsule Place 1 capsule (18 mcg total) into inhaler and inhale daily. 30 capsule 12   No current facility-administered medications for this visit.     Allergies:   Review of patient's allergies indicates no known allergies.   Social History:  The patient  reports that he has been smoking Cigarettes.  He has a 42.00 pack-year smoking history. He has never used smokeless tobacco. He reports that he does not drink alcohol or use drugs.   Family History:  The patient's family history includes Hypertension in his father, mother, sister, and sister.  ROS:   ROS   PHYSICAL EXAM: *** VS:  There were no vitals taken for this visit. BMI: There is no height or weight on file to calculate BMI.  Physical Exam   EKG:  Was ordered and interpreted by me today. Shows ***  Recent Labs: 01/01/2016: BUN 12; Creatinine, Ser 0.96; Hemoglobin 13.4; Platelets 171; Potassium 3.2; Sodium 142  No results found for requested labs within last 8760 hours.   CrCl cannot be calculated (Unknown ideal weight.).   Wt Readings from Last 3 Encounters:  01/01/16 142 lb (64.4 kg)  10/14/15 160 lb (72.6 kg)  09/29/15 164 lb (74.4 kg)     Other studies reviewed: Additional studies/records reviewed today include: summarized above  ASSESSMENT AND PLAN:  1. Chest pain: 2. CAD s/p CABG s/p PCI as above: 3. Chronic systolic CHF/ICM:  4. HTN: 5. COPD/tobacco abuse:  Disposition: F/u with *** in   Current  medicines are reviewed at length with the patient today.  The patient did not have any concerns regarding medicines.  Elinor Dodge PA-C 02/04/2016 10:43 AM     [facility] [address] [place], [place] [address] [phone]

## 2016-03-14 ENCOUNTER — Ambulatory Visit: Payer: Medicaid Other | Admitting: Physician Assistant

## 2016-03-14 NOTE — Progress Notes (Deleted)
Cardiology Office Note Date:  03/14/2016  Patient ID:  Blake [name], DOB 05-Nov-1955, MRN [id number] PCP:  Fidel Levy, MD  Cardiologist:  Dr. Mariah Milling, MD  ***refresh   Chief Complaint: ER follow up  History of Present Illness: Blake [name] is a 60 y.o. male with history of CAD s/p 4-vessel CABG (LIMA-LAD, VG-diag, VG-OM, VG-RPDA) in the setting of an MI in 2012, COPD with ongoing tobacco abuse at least 1 pack per day, HTN, hepatits B and C, schizophrenia, prior incarceration x 2 years, and LE edema who presents for ED follow up from recent ED visit on 6/24 for chest pain and SOB.   Prior cardaic catheterization in 04/2008 3-vessel disease with severe anterolateral and severe apical hypokinesis, EF 32%. It was recommended he seek consultation with cardiothoracic surgery for evaluation of cardiac bypass surgery. Most recent cardiac cath from 2016 in the setting of increased SOB and LE edema showed occluded SVG to OM and SVG to PDA. Occluded native RCA with collaterals from left to right. Patent LIMA to LAD and patent SVG to Diagonal. There was severe/critical disease in teh mid LCx estimated at 95% at the take off of the AV groove. He underwent successful PCI/DES (Xience Apline stent) to the mid LCx with 0% residual stenosis. He was last seen by Dr. Mariah Milling on 09/29/2015 and denied any chest pain at that time. He continued to smoke and was living a busy lifestyle. Echo was ordered given his ICM and chronic SOB. Unfortunately, the patient did not have this done.   He presented to the [facility] ED on 01/01/2016 with complaints of chest pain and SOB x 1 week at that time. Symptoms were worse with exertion. He had been out of his inhalers for his COPD at that time. He was found to be hypokalemic with a potassium of 3.2 s/p repletion in the ED. CXR without active disease. EKG non-acute. Troponin negative x 1. He was noted to be wheezing on exam and was treated with nebs in the ED and discharged.    Today he comes in today stating ***.    Past Medical History:  Diagnosis Date  . Acute headache   . COPD (chronic obstructive pulmonary disease) (HCC)   . Coronary artery disease    a. cath 2009 w/ severe 3-vessel dz, rec bypass; b. MI 2012-->4-vessel cabg; c. cath 2016: occluded VG-OM and VG-RPDA, patent LIMA-LAD and patent VG-Diag, 95% stenosis of mid LCx s/p PCI/DES 0% residual stenosis  . Depression   . Hepatitis B   . Hepatitis C   . Hyperlipidemia   . Hypertension   . MI (myocardial infarction) (HCC)    x 2  . Tobacco abuse     Past Surgical History:  Procedure Laterality Date  . CARDIAC CATHETERIZATION    . CORONARY ANGIOPLASTY WITH STENT PLACEMENT  2009  . CORONARY ARTERY BYPASS GRAFT  2012   [facility]  . KNEE SURGERY     right knee     Current Outpatient Prescriptions  Medication Sig Dispense Refill  . albuterol (PROVENTIL HFA;VENTOLIN HFA) 108 (90 Base) MCG/ACT inhaler Inhale 2 puffs into the lungs every 6 (six) hours as needed for wheezing or shortness of breath. 1 Inhaler 0  . amoxicillin-clavulanate (AUGMENTIN) 875-125 MG tablet Take 1 tablet by mouth 2 (two) times daily. 10 tablet 0  . aspirin 81 MG chewable tablet Chew 1 tablet (81 mg total) by mouth daily. 30 tablet 11  . clopidogrel (PLAVIX) 75 MG tablet Take 1  tablet (75 mg total) by mouth daily. 30 tablet 2  . dextromethorphan-guaiFENesin (MUCINEX DM) 30-600 MG 12hr tablet Take 1 tablet by mouth 2 (two) times daily. 20 tablet 0  . fluticasone (FLONASE) 50 MCG/ACT nasal spray Place 2 sprays into both nostrils daily. 16 g 11  . Fluticasone-Salmeterol (ADVAIR) 250-50 MCG/DOSE AEPB Inhale 1 puff into the lungs 2 (two) times daily. 60 each 11  . furosemide (LASIX) 20 MG tablet Take 1 tablet (20 mg total) by mouth daily as needed. 30 tablet 2  . lisinopril (PRINIVIL,ZESTRIL) 10 MG tablet Take 1 tablet (10 mg total) by mouth daily. Reported on 09/29/2015 30 tablet 11  . loratadine (CLARITIN) 10 MG tablet Take 10 mg  by mouth daily. Reported on 09/29/2015    . metoprolol succinate (TOPROL XL) 50 MG 24 hr tablet Take 1 tablet (50 mg total) by mouth daily. Take with or immediately following a meal. 30 tablet 2  . nitroGLYCERIN (NITROSTAT) 0.4 MG SL tablet Place 0.4 mg under the tongue. Reported on 09/29/2015    . predniSONE (DELTASONE) 10 MG tablet Take 5 tablets (50 mg total) by mouth daily. 20 tablet 0  . SUBOXONE 8-2 MG FILM PLACE 1 FILM UNDER THE TONGUE TWICE A DAY  0  . tiotropium (SPIRIVA HANDIHALER) 18 MCG inhalation capsule Place 1 capsule (18 mcg total) into inhaler and inhale daily. 30 capsule 12   No current facility-administered medications for this visit.     Allergies:   Review of patient's allergies indicates no known allergies.   Social History:  The patient  reports that he has been smoking Cigarettes.  He has a 42.00 pack-year smoking history. He has never used smokeless tobacco. He reports that he does not drink alcohol or use drugs.   Family History:  The patient's family history includes Hypertension in his father, mother, sister, and sister.  ROS:   ROS   PHYSICAL EXAM: *** VS:  There were no vitals taken for this visit. BMI: There is no height or weight on file to calculate BMI.  Physical Exam   EKG:  Was ordered and interpreted by me today. Shows ***  Recent Labs: 01/01/2016: BUN 12; Creatinine, Ser 0.96; Hemoglobin 13.4; Platelets 171; Potassium 3.2; Sodium 142  No results found for requested labs within last 8760 hours.   CrCl cannot be calculated (Unknown ideal weight.).   Wt Readings from Last 3 Encounters:  01/01/16 142 lb (64.4 kg)  10/14/15 160 lb (72.6 kg)  09/29/15 164 lb (74.4 kg)     Other studies reviewed: Additional studies/records reviewed today include: summarized above  ASSESSMENT AND PLAN:  1. CAD s/p CABG as above: 2. Chronic systolic CHF/ICM: 3. COPD: 4. Tobacco abuse:  Disposition: F/u with *** in   Current medicines are reviewed at length  with the patient today.  The patient did not have any concerns regarding medicines.  Elinor DodgeSigned, Katlynn Naser PA-C 03/14/2016 9:10 AM     [facility] [address] [place], [place] [address] [phone]

## 2017-01-23 ENCOUNTER — Emergency Department
Admission: EM | Admit: 2017-01-23 | Discharge: 2017-01-23 | Disposition: A | Discharge status: Home / Self Care | Payer: Medicaid Other | Attending: Emergency Medicine | Admitting: Emergency Medicine

## 2017-01-23 ENCOUNTER — Encounter: Payer: Self-pay | Admitting: Emergency Medicine

## 2017-01-23 DIAGNOSIS — I2510 Atherosclerotic heart disease of native coronary artery without angina pectoris: Secondary | ICD-10-CM | POA: Diagnosis not present

## 2017-01-23 DIAGNOSIS — X118XXA Contact with other hot tap-water, initial encounter: Secondary | ICD-10-CM | POA: Insufficient documentation

## 2017-01-23 DIAGNOSIS — I110 Hypertensive heart disease with heart failure: Secondary | ICD-10-CM | POA: Insufficient documentation

## 2017-01-23 DIAGNOSIS — T2122XA Burn of second degree of abdominal wall, initial encounter: Secondary | ICD-10-CM | POA: Insufficient documentation

## 2017-01-23 DIAGNOSIS — J449 Chronic obstructive pulmonary disease, unspecified: Secondary | ICD-10-CM | POA: Insufficient documentation

## 2017-01-23 DIAGNOSIS — Y93G3 Activity, cooking and baking: Secondary | ICD-10-CM | POA: Insufficient documentation

## 2017-01-23 DIAGNOSIS — I509 Heart failure, unspecified: Secondary | ICD-10-CM | POA: Diagnosis not present

## 2017-01-23 DIAGNOSIS — T2126XA Burn of second degree of male genital region, initial encounter: Secondary | ICD-10-CM | POA: Insufficient documentation

## 2017-01-23 DIAGNOSIS — Y998 Other external cause status: Secondary | ICD-10-CM | POA: Diagnosis not present

## 2017-01-23 DIAGNOSIS — Z7902 Long term (current) use of antithrombotics/antiplatelets: Secondary | ICD-10-CM | POA: Insufficient documentation

## 2017-01-23 DIAGNOSIS — Y929 Unspecified place or not applicable: Secondary | ICD-10-CM | POA: Diagnosis not present

## 2017-01-23 DIAGNOSIS — I252 Old myocardial infarction: Secondary | ICD-10-CM | POA: Insufficient documentation

## 2017-01-23 DIAGNOSIS — F17210 Nicotine dependence, cigarettes, uncomplicated: Secondary | ICD-10-CM | POA: Insufficient documentation

## 2017-01-23 DIAGNOSIS — T300 Burn of unspecified body region, unspecified degree: Secondary | ICD-10-CM

## 2017-01-23 MED ORDER — SILVER SULFADIAZINE 1 % EX CREA
TOPICAL_CREAM | CUTANEOUS | Status: AC
  Filled 2017-01-23: qty 85

## 2017-01-23 MED ORDER — IBUPROFEN 600 MG PO TABS
600.0000 mg | ORAL_TABLET | Freq: Once | ORAL | Status: AC
  Administered 2017-01-23: 600 mg via ORAL
  Filled 2017-01-23: qty 1

## 2017-01-23 MED ORDER — SILVER SULFADIAZINE 1 % EX CREA: TOPICAL_CREAM | CUTANEOUS | 1 refills | Status: DC

## 2017-01-23 MED ORDER — SILVER SULFADIAZINE 1 % EX CREA
TOPICAL_CREAM | Freq: Once | CUTANEOUS | Status: AC
  Administered 2017-01-23: 1 via TOPICAL

## 2017-01-23 MED ORDER — TRAMADOL HCL 50 MG PO TABS: 50.0000 mg | ORAL_TABLET | Freq: Four times a day (QID) | ORAL | 0 refills | Status: AC | PRN

## 2017-01-23 MED ORDER — IBUPROFEN 600 MG PO TABS: 600.0000 mg | ORAL_TABLET | Freq: Three times a day (TID) | ORAL | 0 refills | Status: DC | PRN

## 2017-01-23 MED ORDER — OXYCODONE-ACETAMINOPHEN 5-325 MG PO TABS
1.0000 | ORAL_TABLET | Freq: Once | ORAL | Status: AC
  Administered 2017-01-23: 1 via ORAL
  Filled 2017-01-23: qty 1
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
## 2017-04-19 ENCOUNTER — Emergency Department: Payer: Medicaid Other

## 2017-04-19 ENCOUNTER — Emergency Department
Admission: EM | Admit: 2017-04-19 | Discharge: 2017-04-19 | Disposition: A | Discharge status: Home / Self Care | Payer: Medicaid Other | Attending: Emergency Medicine | Admitting: Emergency Medicine

## 2017-04-19 ENCOUNTER — Encounter: Payer: Self-pay | Admitting: Emergency Medicine

## 2017-04-19 DIAGNOSIS — J449 Chronic obstructive pulmonary disease, unspecified: Secondary | ICD-10-CM | POA: Diagnosis not present

## 2017-04-19 DIAGNOSIS — I252 Old myocardial infarction: Secondary | ICD-10-CM | POA: Insufficient documentation

## 2017-04-19 DIAGNOSIS — Z79899 Other long term (current) drug therapy: Secondary | ICD-10-CM | POA: Insufficient documentation

## 2017-04-19 DIAGNOSIS — Z7982 Long term (current) use of aspirin: Secondary | ICD-10-CM | POA: Diagnosis not present

## 2017-04-19 DIAGNOSIS — I25810 Atherosclerosis of coronary artery bypass graft(s) without angina pectoris: Secondary | ICD-10-CM | POA: Diagnosis not present

## 2017-04-19 DIAGNOSIS — I110 Hypertensive heart disease with heart failure: Secondary | ICD-10-CM | POA: Insufficient documentation

## 2017-04-19 DIAGNOSIS — Z7901 Long term (current) use of anticoagulants: Secondary | ICD-10-CM | POA: Insufficient documentation

## 2017-04-19 DIAGNOSIS — F17210 Nicotine dependence, cigarettes, uncomplicated: Secondary | ICD-10-CM | POA: Diagnosis not present

## 2017-04-19 DIAGNOSIS — R1031 Right lower quadrant pain: Secondary | ICD-10-CM | POA: Insufficient documentation

## 2017-04-19 DIAGNOSIS — I5022 Chronic systolic (congestive) heart failure: Secondary | ICD-10-CM | POA: Diagnosis not present

## 2017-04-19 LAB — COMPREHENSIVE METABOLIC PANEL
ALK PHOS: 100 U/L (ref 38–126)
ALT: 23 U/L (ref 17–63)
AST: 30 U/L (ref 15–41)
Albumin: 3.3 g/dL — ABNORMAL LOW (ref 3.5–5.0)
Anion gap: 5 (ref 5–15)
BILIRUBIN TOTAL: 0.6 mg/dL (ref 0.3–1.2)
BUN: 12 mg/dL (ref 6–20)
CO2: 27 mmol/L (ref 22–32)
CREATININE: 1.06 mg/dL (ref 0.61–1.24)
Calcium: 8.8 mg/dL — ABNORMAL LOW (ref 8.9–10.3)
Chloride: 105 mmol/L (ref 101–111)
GFR calc Af Amer: >60 mL/min (ref >60)
GFR calc non Af Amer: >60 mL/min (ref >60)
Glucose, Bld: 91 mg/dL (ref 65–99)
Potassium: 3.9 mmol/L (ref 3.5–5.1)
Sodium: 137 mmol/L (ref 135–145)
Total Protein: 7.5 g/dL (ref 6.5–8.1)

## 2017-04-19 LAB — URINALYSIS, COMPLETE (UACMP) WITH MICROSCOPIC
Bacteria, UA: NONE SEEN
Bilirubin Urine: NEGATIVE
GLUCOSE, UA: NEGATIVE mg/dL
Hgb urine dipstick: NEGATIVE
Ketones, ur: NEGATIVE mg/dL
Leukocytes, UA: NEGATIVE
Nitrite: NEGATIVE
PH: 6 (ref 5.0–8.0)
Protein, ur: NEGATIVE mg/dL
SPECIFIC GRAVITY, URINE: 1.018 (ref 1.005–1.030)

## 2017-04-19 LAB — CBC WITH DIFFERENTIAL/PLATELET
Basophils Absolute: 0 10*3/uL (ref 0–0.1)
Basophils Relative: 1 %
Eosinophils Absolute: 0.2 10*3/uL (ref 0–0.7)
Eosinophils Relative: 4 %
HEMATOCRIT: 44.7 % (ref 40.0–52.0)
HEMOGLOBIN: 14.6 g/dL (ref 13.0–18.0)
Lymphocytes Relative: 27 %
Lymphs Abs: 1.3 10*3/uL (ref 1.0–3.6)
MCH: 30.9 pg (ref 26.0–34.0)
MCHC: 32.8 g/dL (ref 32.0–36.0)
MCV: 94.2 fL (ref 80.0–100.0)
MONOS PCT: 11 %
Monocytes Absolute: 0.5 10*3/uL (ref 0.2–1.0)
NEUTROS PCT: 57 %
Neutro Abs: 2.8 10*3/uL (ref 1.4–6.5)
Platelets: 233 10*3/uL (ref 150–440)
RBC: 4.74 MIL/uL (ref 4.40–5.90)
RDW: 13.8 % (ref 11.5–14.5)
WBC: 4.8 10*3/uL (ref 3.8–10.6)

## 2017-04-19 LAB — LIPASE, BLOOD: Lipase: 16 U/L (ref 11–51)

## 2017-04-19 MED ORDER — IOPAMIDOL (ISOVUE-300) INJECTION 61%
30.0000 mL | Freq: Once | INTRAVENOUS | Status: AC | PRN
  Administered 2017-04-19: 30 mL via ORAL

## 2017-04-19 MED ORDER — IOPAMIDOL (ISOVUE-300) INJECTION 61%
100.0000 mL | Freq: Once | INTRAVENOUS | Status: AC | PRN
  Administered 2017-04-19: 100 mL via INTRAVENOUS

## 2017-04-19 NOTE — ED Notes (Signed)
Pt. Returned to tx. room in stable condition with no acute changes since departure from unit for scans.   

## 2017-04-19 NOTE — ED Provider Notes (Addendum)
[facility] Emergency Department Provider Note   ____________________________________________   First MD Initiated Contact with Patient 04/19/17 1333     (approximate)  I have reviewed the triage vital signs and the nursing notes.   HISTORY  Chief Complaint Hip Pain    HPI Blake [name] is a 61 y.o. male Patient reports right lower quadrant pain for about 3 days may be getting a little worse she has no appetite but is not running a fever. He is not vomiting or having diarrhea. The pain is moderate. Seems to be mostly achy.worse with movement and palpationpatient does not feel the need for pain medicine at this point.  Past Medical History:  Diagnosis Date  . Acute headache   . COPD (chronic obstructive pulmonary disease) (HCC)   . Coronary artery disease    a. cath 2009 w/ severe 3-vessel dz, rec bypass; b. MI 2012-->4-vessel cabg; c. cath 2016: occluded VG-OM and VG-RPDA, patent LIMA-LAD and patent VG-Diag, 95% stenosis of mid LCx s/p PCI/DES 0% residual stenosis  . Depression   . Hepatitis B   . Hepatitis C   . Hyperlipidemia   . Hypertension   . MI (myocardial infarction) (HCC)    x 2  . Tobacco abuse     Patient Active Problem List   Diagnosis Date Noted  . Benign prostatic hyperplasia with urinary obstruction 09/29/2015  . Chest pain at rest 09/29/2015  . Cough 09/29/2015  . Current tobacco use 09/29/2015  . Clinical depression 09/29/2015  . Essential (primary) hypertension 09/29/2015  . Cephalalgia 09/29/2015  . Heart disease 09/29/2015  . Hypercholesteremia 09/29/2015  . 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 H/O acute myocardial infarction 09/29/2015  . Alveolar aeration decreased 09/29/2015  . Special screening for malignant neoplasms, colon 09/29/2015  . Other chest pain 09/16/2014  . SOB (shortness of breath) 09/16/2014  . Hepatitis 09/16/2014  . S/P CABG (coronary artery bypass graft) 09/16/2014  . Coronary artery disease  involving native coronary artery of native heart with other form of angina pectoris (HCC) 09/16/2014  . Shortness of breath 09/16/2014  . Smoker 09/16/2014  . Emphysema of lung (HCC) 09/16/2014  . Chronic systolic CHF (congestive heart failure) (HCC) 09/16/2014    Past Surgical History:  Procedure Laterality Date  . CARDIAC CATHETERIZATION    . CORONARY ANGIOPLASTY WITH STENT PLACEMENT  2009  . CORONARY ARTERY BYPASS GRAFT  2012   [facility]  . KNEE SURGERY     right knee     Prior to Admission medications   Medication Sig Start Date End Date Taking? Authorizing Provider  albuterol (PROVENTIL HFA;VENTOLIN HFA) 108 (90 Base) MCG/ACT inhaler Inhale 2 puffs into the lungs every 6 (six) hours as needed for wheezing or shortness of breath. 01/01/16   Governor Rooks, MD  amoxicillin-clavulanate (AUGMENTIN) 875-125 MG tablet Take 1 tablet by mouth 2 (two) times daily. 09/29/15   Loura Pardon, NP  aspirin 81 MG chewable tablet Chew 1 tablet (81 mg total) by mouth daily. 10/27/14   Sondra Barges, PA-C  clopidogrel (PLAVIX) 75 MG tablet Take 1 tablet (75 mg total) by mouth daily. 09/29/15   Krebs, Laurel Dimmer, NP  dextromethorphan-guaiFENesin (MUCINEX DM) 30-600 MG 12hr tablet Take 1 tablet by mouth 2 (two) times daily. 09/29/15   Krebs, Laurel Dimmer, NP  fluticasone (FLONASE) 50 MCG/ACT nasal spray Place 2 sprays into both nostrils daily. 09/29/15   Loura Pardon, NP  Fluticasone-Salmeterol (ADVAIR) 250-50 MCG/DOSE AEPB Inhale 1 puff  into the lungs 2 (two) times daily. 10/14/15   Krebs, Laurel Dimmer, NP  furosemide (LASIX) 20 MG tablet Take 1 tablet (20 mg total) by mouth daily as needed. 09/29/15   Loura Pardon, NP  ibuprofen (ADVIL,MOTRIN) 600 MG tablet Take 1 tablet (600 mg total) by mouth every 8 (eight) hours as needed. 01/23/17   Joni Reining, PA-C  lisinopril (PRINIVIL,ZESTRIL) 10 MG tablet Take 1 tablet (10 mg total) by mouth daily. Reported on 09/29/2015 09/29/15   Loura Pardon, NP    loratadine (CLARITIN) 10 MG tablet Take 10 mg by mouth daily. Reported on 09/29/2015 09/09/14   [provider]  metoprolol succinate (TOPROL XL) 50 MG 24 hr tablet Take 1 tablet (50 mg total) by mouth daily. Take with or immediately following a meal. 09/29/15   Krebs, Laurel Dimmer, NP  nitroGLYCERIN (NITROSTAT) 0.4 MG SL tablet Place 0.4 mg under the tongue. Reported on 09/29/2015 09/09/14   [provider]  predniSONE (DELTASONE) 10 MG tablet Take 5 tablets (50 mg total) by mouth daily. 01/01/16   Governor Rooks, MD  silver sulfADIAZINE (SILVADENE) 1 % cream Apply to affected area daily 01/23/17   Nona Dell K, PA-C  SUBOXONE 8-2 MG FILM PLACE 1 FILM UNDER THE TONGUE TWICE A DAY 09/21/15   [provider]  tiotropium (SPIRIVA HANDIHALER) 18 MCG inhalation capsule Place 1 capsule (18 mcg total) into inhaler and inhale daily. 10/14/15   Krebs, Laurel Dimmer, NP  traMADol (ULTRAM) 50 MG tablet Take 1 tablet (50 mg total) by mouth every 6 (six) hours as needed. 01/23/17 01/23/18  Joni Reining, PA-C    Allergies Patient has no known allergies.  Family History  Problem Relation Age of Onset  . Hypertension Mother   . Hypertension Father   . Hypertension Sister   . Hypertension Sister     Social History Social History  Substance Use Topics  . Smoking status: Current Every Day Smoker    Packs/day: 1.00    Years: 42.00    Types: Cigarettes  . Smokeless tobacco: Never Used  . Alcohol use No    Review of Systems  Constitutional: No fever/chills Eyes: No visual changes. ENT: No sore throat. Cardiovascular: Denies chest pain. Respiratory: Denies shortness of breath. Gastrointestinal: see history of present illness Genitourinary: Negative for dysuria. Musculoskeletal: Negative for back pain. Skin: Negative for rash. Neurological: Negative for headaches, focal weakness   ____________________________________________   PHYSICAL EXAM:  VITAL SIGNS: ED Triage Vitals  [04/19/17 1314]  Enc Vitals Group     BP (!) 142/70     Pulse Rate 80     Resp 16     Temp 98.2 F (36.8 C)     Temp Source Oral     SpO2 96 %     Weight 140 lb (63.5 kg)     Height  (1.626 m)     Head Circumference      Peak Flow      Pain Score 10     Pain Loc      Pain Edu?      Excl. in GC?     Constitutional: Alert and oriented. Well appearing and in no acute distress. Eyes: Conjunctivae are normal. PERRL. EOMI. Head: Atraumatic. Nose: No congestion/rhinnorhea. Mouth/Throat: Mucous membranes are moist.  Oropharynx non-erythematous. Neck: No stridor.  Cardiovascular: Normal rate, regular rhythm. Grossly normal heart sounds.  Good peripheral circulation. Respiratory: Normal respiratory effort.  No retractions. Lungs CTAB. Gastrointestinal:soft  and nontender except the right lower quadrant worst tenderness to palpation not really very tender to percussion at all. Somewhat decreased bowel sounds. Musculoskeletal: No lower extremity tenderness nor edema.  No joint effusions. Neurologic:  Normal speech and language. No gross focal neurologic deficits are appreciated.  Skin:  Skin is warm, dry and intact. No rash noted. Psychiatric: Mood and affect are normal. Speech and behavior are normal.  ____________________________________________   LABS (all labs ordered are listed, but only abnormal results are displayed)  Labs Reviewed  URINALYSIS, COMPLETE (UACMP) WITH MICROSCOPIC - Abnormal; Notable for the following:       Result Value   Color, Urine YELLOW (*)    APPearance CLEAR (*)    Squamous Epithelial / LPF 0-5 (*)    All other components within normal limits  COMPREHENSIVE METABOLIC PANEL - Abnormal; Notable for the following:    Calcium 8.8 (*)    Albumin 3.3 (*)    All other components within normal limits  LIPASE, BLOOD  CBC WITH DIFFERENTIAL/PLATELET    ____________________________________________  EKG   ____________________________________________  RADIOLOGY  ultrasound of the abdomen does not visualize the appendix_______________________________   PROCEDURES  Procedure(s) performed:  Procedures  Critical Care performed:  ____________________________________________   INITIAL IMPRESSION / ASSESSMENT AND PLAN / ED COURSE  a shouldn't still has right lower quadrant pain. Still tender to palpation it's worse when he walks CT is still down ultrasound does not see the appendix I have seen many patients with normal white blood count with appendicitis cannot use that to determine if he has appendicitis or not I will transfer him ER to ER to [facility] for CT and hopefully it will be negative and he can go home. I discussed this with the patient.      ____________________________________________   FINAL CLINICAL IMPRESSION(S) / ED DIAGNOSES  Final diagnoses:  Right lower quadrant abdominal pain      NEW MEDICATIONS STARTED DURING THIS VISIT:  New Prescriptions   No medications on file     Note:  This document was prepared using Dragon voice recognition software and may include unintentional dictation errors.    Arnaldo Natal, MD 04/19/17 Sable Feil    Arnaldo Natal, MD 04/19/17 2207

## 2017-04-19 NOTE — ED Notes (Signed)
Pt. Verbalizes understanding of d/c instructions, medications, and follow-up. VS stable and pain controlled per pt.  Pt. In NAD at time of d/c and denies further concerns regarding this visit. Pt. Stable at the time of departure from the unit, departing unit by the safest and most appropriate manner per that pt condition and limitations. Pt advised to return to the ED at any time for emergent concerns, or for new/worsening symptoms.   

## 2017-04-19 NOTE — ED Triage Notes (Signed)
Says pain right hip that increases with wt bearing for about 3 days. Says it got worse last night

## 2017-04-19 NOTE — ED Notes (Signed)
CT called and notified that patient is finished with his oral contrast.

## 2017-04-19 NOTE — Discharge Instructions (Signed)
the CAT scan didn't show any appendicitis. There was a lot of stool in your colon though. Please go to the pharmacy and get some over-the-counter laxative or possibly an over-the-counter anabolic a fleets enema and use that. That should make you pain better. If it doesn't or if he get worse or have a fever or vomiting please return here. As you know the CAT scan is now working at home believe there will be a long delay like this again.

## 2017-12-11 ENCOUNTER — Emergency Department
Admission: EM | Admit: 2017-12-11 | Discharge: 2017-12-11 | Disposition: A | Discharge status: Home / Self Care | Payer: Medicaid Other | Attending: Emergency Medicine | Admitting: Emergency Medicine

## 2017-12-11 ENCOUNTER — Emergency Department: Payer: Medicaid Other

## 2017-12-11 ENCOUNTER — Encounter: Payer: Self-pay | Admitting: Emergency Medicine

## 2017-12-11 DIAGNOSIS — M79652 Pain in left thigh: Secondary | ICD-10-CM | POA: Diagnosis not present

## 2017-12-11 DIAGNOSIS — Z79899 Other long term (current) drug therapy: Secondary | ICD-10-CM | POA: Diagnosis not present

## 2017-12-11 DIAGNOSIS — I110 Hypertensive heart disease with heart failure: Secondary | ICD-10-CM | POA: Diagnosis not present

## 2017-12-11 DIAGNOSIS — F17210 Nicotine dependence, cigarettes, uncomplicated: Secondary | ICD-10-CM | POA: Insufficient documentation

## 2017-12-11 DIAGNOSIS — I5022 Chronic systolic (congestive) heart failure: Secondary | ICD-10-CM | POA: Diagnosis not present

## 2017-12-11 DIAGNOSIS — I2510 Atherosclerotic heart disease of native coronary artery without angina pectoris: Secondary | ICD-10-CM | POA: Insufficient documentation

## 2017-12-11 MED ORDER — METAXALONE 800 MG PO TABS: 400.0000 mg | ORAL_TABLET | Freq: Four times a day (QID) | ORAL | 0 refills | Status: DC | PRN
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
## 2017-12-11 NOTE — ED Provider Notes (Signed)
[facility] Emergency Department Provider Note  ____________________________________________  Time seen: Approximately 11:29 AM  I have reviewed the triage vital signs and the nursing notes.   HISTORY  Chief Complaint Insect Bite and Abscess   HPI Blake [name] is a 62 y.o. male who presents to the emergency department for evaluation and treatment of pain the the left thigh that started 2 days ago. Skin feels like it is hot to touch and he believes he was bitten by a spider. No known fever. No nausea or vomiting. No alleviating measures have been attempted for this complaint.  Past Medical History:  Diagnosis Date  . Acute headache   . COPD (chronic obstructive pulmonary disease) (HCC)   . Coronary artery disease    a. cath 2009 w/ severe 3-vessel dz, rec bypass; b. MI 2012-->4-vessel cabg; c. cath 2016: occluded VG-OM and VG-RPDA, patent LIMA-LAD and patent VG-Diag, 95% stenosis of mid LCx s/p PCI/DES 0% residual stenosis  . Depression   . Hepatitis B   . Hepatitis C   . Hyperlipidemia   . Hypertension   . MI (myocardial infarction) (HCC)    x 2  . Tobacco abuse     Patient Active Problem List   Diagnosis Date Noted  . Benign prostatic hyperplasia with urinary obstruction 09/29/2015  . Chest pain at rest 09/29/2015  . Cough 09/29/2015  . Current tobacco use 09/29/2015  . Clinical depression 09/29/2015  . Essential (primary) hypertension 09/29/2015  . Cephalalgia 09/29/2015  . Heart disease 09/29/2015  . Hypercholesteremia 09/29/2015  . 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 H/O acute myocardial infarction 09/29/2015  . Alveolar aeration decreased 09/29/2015  . Special screening for malignant neoplasms, colon 09/29/2015  . Other chest pain 09/16/2014  . SOB (shortness of breath) 09/16/2014  . Hepatitis 09/16/2014  . S/P CABG (coronary artery bypass graft) 09/16/2014  . Coronary artery disease involving native coronary artery of native heart  with other form of angina pectoris (HCC) 09/16/2014  . Shortness of breath 09/16/2014  . Smoker 09/16/2014  . Emphysema of lung (HCC) 09/16/2014  . Chronic systolic CHF (congestive heart failure) (HCC) 09/16/2014    Past Surgical History:  Procedure Laterality Date  . CARDIAC CATHETERIZATION    . CORONARY ANGIOPLASTY WITH STENT PLACEMENT  2009  . CORONARY ARTERY BYPASS GRAFT  2012   [facility]  . KNEE SURGERY     right knee     Prior to Admission medications   Medication Sig Start Date End Date Taking? Authorizing Provider  albuterol (PROVENTIL HFA;VENTOLIN HFA) 108 (90 Base) MCG/ACT inhaler Inhale 2 puffs into the lungs every 6 (six) hours as needed for wheezing or shortness of breath. 01/01/16   Governor RooksLord, Rebecca, MD  amoxicillin-clavulanate (AUGMENTIN) 875-125 MG tablet Take 1 tablet by mouth 2 (two) times daily. 09/29/15   Loura PardonKrebs, Amy Lauren, NP  aspirin 81 MG chewable tablet Chew 1 tablet (81 mg total) by mouth daily. 10/27/14   Sondra Bargesunn, Ryan M, PA-C  clopidogrel (PLAVIX) 75 MG tablet Take 1 tablet (75 mg total) by mouth daily. 09/29/15   Krebs, Laurel DimmerAmy Lauren, NP  dextromethorphan-guaiFENesin (MUCINEX DM) 30-600 MG 12hr tablet Take 1 tablet by mouth 2 (two) times daily. 09/29/15   Krebs, Laurel DimmerAmy Lauren, NP  fluticasone (FLONASE) 50 MCG/ACT nasal spray Place 2 sprays into both nostrils daily. 09/29/15   Krebs, Laurel DimmerAmy Lauren, NP  Fluticasone-Salmeterol (ADVAIR) 250-50 MCG/DOSE AEPB Inhale 1 puff into the lungs 2 (two) times daily. 10/14/15   Loura PardonKrebs, Amy Lauren,  NP  furosemide (LASIX) 20 MG tablet Take 1 tablet (20 mg total) by mouth daily as needed. 09/29/15   Loura Pardon, NP  ibuprofen (ADVIL,MOTRIN) 600 MG tablet Take 1 tablet (600 mg total) by mouth every 8 (eight) hours as needed. 01/23/17   Joni Reining, PA-C  lisinopril (PRINIVIL,ZESTRIL) 10 MG tablet Take 1 tablet (10 mg total) by mouth daily. Reported on 09/29/2015 09/29/15   Loura Pardon, NP  loratadine (CLARITIN) 10 MG tablet Take 10 mg by mouth  daily. Reported on 09/29/2015 09/09/14   [provider]  metaxalone (SKELAXIN) 800 MG tablet Take 0.5 tablets (400 mg total) by mouth 4 (four) times daily as needed for muscle spasms. 12/11/17 12/11/18  Meliya Mcconahy, Rulon Eisenmenger B, FNP  metoprolol succinate (TOPROL XL) 50 MG 24 hr tablet Take 1 tablet (50 mg total) by mouth daily. Take with or immediately following a meal. 09/29/15   Krebs, Laurel Dimmer, NP  nitroGLYCERIN (NITROSTAT) 0.4 MG SL tablet Place 0.4 mg under the tongue. Reported on 09/29/2015 09/09/14   [provider]  predniSONE (DELTASONE) 10 MG tablet Take 5 tablets (50 mg total) by mouth daily. 01/01/16   Governor Rooks, MD  silver sulfADIAZINE (SILVADENE) 1 % cream Apply to affected area daily 01/23/17   Nona Dell K, PA-C  SUBOXONE 8-2 MG FILM PLACE 1 FILM UNDER THE TONGUE TWICE A DAY 09/21/15   [provider]  tiotropium (SPIRIVA HANDIHALER) 18 MCG inhalation capsule Place 1 capsule (18 mcg total) into inhaler and inhale daily. 10/14/15   Krebs, Laurel Dimmer, NP  traMADol (ULTRAM) 50 MG tablet Take 1 tablet (50 mg total) by mouth every 6 (six) hours as needed. 01/23/17 01/23/18  Joni Reining, PA-C    Allergies Patient has no known allergies.  Family History  Problem Relation Age of Onset  . Hypertension Mother   . Hypertension Father   . Hypertension Sister   . Hypertension Sister     Social History Social History   Tobacco Use  . Smoking status: Current Every Day Smoker    Packs/day: 1.00    Years: 42.00    Pack years: 42.00    Types: Cigarettes  . Smokeless tobacco: Never Used  Substance Use Topics  . Alcohol use: No  . Drug use: No    Review of Systems  Constitutional: Negative for fever. Respiratory: Negative for cough or shortness of breath.  Musculoskeletal: Negative for myalgias Skin: Positive for left thigh skin sensitivity. Neurological: Negative for numbness or paresthesias. ____________________________________________   PHYSICAL  EXAM:  VITAL SIGNS: ED Triage Vitals  Enc Vitals Group     BP --      Pulse Rate 12/11/17 1013 67     Resp 12/11/17 1013 20     Temp 12/11/17 1013 98 F (36.7 C)     Temp Source 12/11/17 1013 Oral     SpO2 12/11/17 1013 100 %     Weight 12/11/17 1014 142 lb (64.4 kg)     Height 12/11/17 1014 5\' 7"  (1.702 m)     Head Circumference --      Peak Flow --      Pain Score 12/11/17 1013 3     Pain Loc --      Pain Edu? --      Excl. in GC? --      Constitutional: Well appearing. Eyes: Conjunctivae are clear without discharge or drainage. Nose: No rhinorrhea noted. Mouth/Throat: Airway is patent.  Neck: No stridor.  Unrestricted range of motion observed.  Cardiovascular: Capillary refill is <3 seconds.  Respiratory: Respirations are even and unlabored.. Musculoskeletal: Left lower extremity tenderness in the popliteal and posterior thigh area without noticeable difference in size in comparison to the right.  Neurologic: Awake, alert, and oriented x 4.  Skin: No evidence of extremity swelling or cellulitis. No abscess.   ____________________________________________   LABS (all labs ordered are listed, but only abnormal results are displayed)  Labs Reviewed - No data to display ____________________________________________  EKG  Not indicated. ____________________________________________  RADIOLOGY  US of the left lower extremity is negative for DVT. ____________________________________________   PROCEDURES  Procedures ____________________________________________   INITIAL IMPRESSION / ASSESSMENT AND PLAN / ED COURSE  Derin Schellenberg is a 61 y.o. male who presents to the emergency department for treatment and evaluation of left leg pain. [place] is negative. He will be treated for muscle strain with skelaxin and advised to follow up with the primary care provider.  Medications - No data to display   Pertinent labs & imaging results that were available during my  care of the patient were reviewed by me and considered in my medical decision making (see chart for details). ____________________________________________   FINAL CLINICAL IMPRESSION(S) / ED DIAGNOSES  Final diagnoses:  Acute thigh pain, left    ED Discharge Orders        Ordered    metaxalone (SKELAXIN) 800 MG tablet  4 times daily PRN     12/11/17 1244       Note:  This document was prepared using Dragon voice recognition software and may include unintentional dictation errors.    Chinita Pester, FNP 12/11/17 1407    Jene Every, MD 12/11/17 1429

## 2017-12-11 NOTE — ED Triage Notes (Signed)
Pt reports thinks a spider bit him on the back of his left thigh. Pt reports abscess there for 3 days.

## 2018-05-22 ENCOUNTER — Telehealth: Payer: Self-pay | Admitting: Cardiovascular Disease

## 2018-05-22 NOTE — Telephone Encounter (Signed)
Patient sister came by office States that she will need a letter stating his diagnosis so that the prison where he is incarcerated will be aware of his cardiac issues We gave her health POA forms to fill out at her request Please advise

## 2018-05-23 NOTE — Telephone Encounter (Signed)
Spoke with patients sister per release form and reviewed that patient records can be obtained at [facility]. Last visit here in our office was in 2017 and then some ED visits since then. She verbalized understanding with no further questions at this time.

## 2018-06-20 ENCOUNTER — Ambulatory Visit: Payer: Medicaid Other | Admitting: Nurse Practitioner

## 2018-06-21 ENCOUNTER — Ambulatory Visit: Payer: Medicaid Other | Admitting: Nurse Practitioner

## 2018-06-24 ENCOUNTER — Encounter: Payer: Self-pay | Admitting: Nurse Practitioner

## 2018-06-24 ENCOUNTER — Ambulatory Visit (INDEPENDENT_AMBULATORY_CARE_PROVIDER_SITE_OTHER): Payer: Medicaid Other | Admitting: Nurse Practitioner

## 2018-06-24 ENCOUNTER — Other Ambulatory Visit: Payer: Self-pay

## 2018-06-24 VITALS — BP 121/64 | HR 73 | Temp 98.0°F | Resp 20 | Ht 67.0 in | Wt 166.2 lb

## 2018-06-24 DIAGNOSIS — I5022 Chronic systolic (congestive) heart failure: Secondary | ICD-10-CM

## 2018-06-24 DIAGNOSIS — J432 Centrilobular emphysema: Secondary | ICD-10-CM | POA: Diagnosis not present

## 2018-06-24 MED ORDER — FLUTICASONE-SALMETEROL 250-50 MCG/DOSE IN AEPB: 1.0000 | INHALATION_SPRAY | Freq: Two times a day (BID) | RESPIRATORY_TRACT | 11 refills | Status: DC

## 2018-06-24 MED ORDER — ALBUTEROL SULFATE HFA 108 (90 BASE) MCG/ACT IN AERS: 2.0000 | INHALATION_SPRAY | Freq: Four times a day (QID) | RESPIRATORY_TRACT | 0 refills | Status: DC | PRN

## 2018-06-24 MED ORDER — TIOTROPIUM BROMIDE MONOHYDRATE 18 MCG IN CAPS: 18.0000 ug | ORAL_CAPSULE | Freq: Every day | RESPIRATORY_TRACT | 12 refills | Status: DC

## 2018-06-24 NOTE — Patient Instructions (Addendum)
Blake [name],   Thank you for coming in to clinic today.  1. Your heart has not been evaluated in a long time.   I have ordered an echocardiogram (ultrasound) of your heart.  [facility] [place].  They will call to schedule.  2. Restart Spiriva one puff once daily Restart Advair one puff twice daily Restart albuterol as needed 1-2 puffs up to four times daily (every 6 hours) as needed.  Please schedule a follow-up appointment with Wilhelmina McardleLauren Kaoir Loree, AGNP. Return in about 3 months (around 09/23/2018) for COPD, CHF.  If you have any other questions or concerns, please feel free to call the clinic or send a message through MyChart. You may also schedule an earlier appointment if necessary.  You will receive a survey after today's visit either digitally by e-mail or paper by [employer] mail. Your experiences and feedback matter to us.  Please respond so we know how we are doing as we provide care for you.  Wilhelmina McardleLauren Deangelo Berns, DNP, AGNP-BC Adult Gerontology Nurse Practitioner [facility], [facility]

## 2018-06-24 NOTE — Progress Notes (Signed)
Subjective:    Patient ID: Blake [name], male    DOB: Nov 14, 1955, 62 y.o.   MRN: [id number]  Blake [name] is a 62 y.o. male presenting on 06/24/2018 for Hypertension (pt needs medication refills. He's been off al medications x 1 mth ) and Shortness of Breath (x 3 weeks )   HPI  Hypertension - He is not checking BP at home or outside of clinic.    - Current medications: none in > 1 year - reported to CMA as 1 month ago, but after review of chart and specific questioning it is found patient has been off medications for extended period of time - He is symptomatic with shortness of breath. - Pt denies headache, lightheadedness, dizziness, changes in vision, chest tightness/pressure, palpitations, leg swelling, sudden loss of speech or loss of consciousness. - He reports an exercise routine that includes walking, biking, 6 days per week. Breakfast - eggs, bacon, grits 2-3 days per week Lunch - skips frequently Dinner - "eat a little something" - meat, no sides, rarely eats vegetables. Snacks - candy bar - His diet is moderate in salt, high in fat, and high in carbohydrates.  - Eats lots of sweets.  Shortness of Breath Patient with known CHF and COPD.  He notes quickly "running out of real quick" with activity.  Walking for less than 1 block creates symptoms, Showering and dressing only occasionally.  Patient sleeps in a bed with one pillow. Denies paroxysmal nocturnal dyspnea and regular leg swelling.   - Continues to smoke nearly 2 ppd.  Social History   Tobacco Use  . Smoking status: Current Every Day Smoker    Packs/day: 1.75    Years: 42.00    Pack years: 73.50    Types: Cigarettes  . Smokeless tobacco: Never Used  Substance Use Topics  . Alcohol use: No  . 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  Constitutional:      General: He is not in acute distress.    Appearance: He is well-developed.  HENT:     Head: Normocephalic and atraumatic.  Neck:     Musculoskeletal: Normal range of motion and neck supple.     Vascular: No carotid bruit, hepatojugular reflux or JVD.  Cardiovascular:     Rate and Rhythm: Normal rate and regular rhythm.     Pulses:          Radial pulses are 2+ on the right side and 2+ on the left side.       Posterior tibial pulses are 1+ on the right side and 1+ on the left side.     Heart sounds: S1 normal and S2 normal. Murmur present. Crescendo  systolic murmur present with a grade of 2/6. No friction rub. No gallop.   Pulmonary:     Effort: Pulmonary effort is normal. No  respiratory distress.     Breath sounds: Decreased air movement present. Rales (througout) present. No wheezing or rhonchi.  Musculoskeletal:     Right lower leg: 1+ Edema present.     Left lower leg: 1+ Edema present.  Skin:    General: Skin is warm and dry.     Capillary Refill: Capillary refill takes less than 2 seconds.  Neurological:     General: No focal deficit present.     Mental Status: He is alert and oriented to person, place, and time.  Psychiatric:        Mood and Affect: Mood normal. Affect is flat.        Speech: Speech normal. He is communicative.        Behavior: Behavior normal. Behavior is cooperative.    Results for orders placed or performed during the hospital encounter of 04/19/17  Urinalysis, Complete w Microscopic  Result Value Ref Range   Color, Urine YELLOW (A) YELLOW   APPearance CLEAR (A) CLEAR   Specific Gravity, Urine 1.018 1.005 - 1.030   pH 6.0 5.0  - 8.0   Glucose, UA NEGATIVE NEGATIVE mg/dL   Hgb urine dipstick NEGATIVE NEGATIVE   Bilirubin Urine NEGATIVE NEGATIVE   Ketones, ur NEGATIVE NEGATIVE mg/dL   Protein, ur NEGATIVE NEGATIVE mg/dL   Nitrite NEGATIVE NEGATIVE   Leukocytes, UA NEGATIVE NEGATIVE   RBC / HPF 0-5 0 - 5 RBC/hpf   WBC, UA 6-30 0 - 5 WBC/hpf   Bacteria, UA NONE SEEN NONE SEEN   Squamous Epithelial / LPF 0-5 (A) NONE SEEN   Mucus PRESENT   Comprehensive metabolic panel  Result Value Ref Range   Sodium 137 135 - 145 mmol/L   Potassium 3.9 3.5 - 5.1 mmol/L   Chloride 105 101 - 111 mmol/L   CO2 27 22 - 32 mmol/L   Glucose, Bld 91 65 - 99 mg/dL   BUN 12 6 - 20 mg/dL   Creatinine, Ser 2.951.06 0.61 - 1.24 mg/dL   Calcium 8.8 (L) 8.9 - 10.3 mg/dL   Total Protein 7.5 6.5 - 8.1 g/dL   Albumin 3.3 (L) 3.5 - 5.0 g/dL   AST 30 15 - 41 U/L   ALT 23 17 - 63 U/L   Alkaline Phosphatase 100 38 - 126 U/L   Total Bilirubin 0.6 0.3 - 1.2 mg/dL   GFR calc non Af Amer >60 >60 mL/min   GFR calc Af Amer >60 >60 mL/min   Anion gap 5 5 - 15  Lipase, blood  Result Value Ref Range   Lipase 16 11 - 51 U/L  CBC with Differential  Result Value Ref Range   WBC 4.8 3.8 - 10.6 K/uL   RBC 4.74 4.40 - 5.90 MIL/uL   Hemoglobin 14.6 13.0 - 18.0 g/dL   HCT 62.144.7 30.840.0 - 65.752.0 %   MCV 94.2 80.0 - 100.0 fL   MCH 30.9 26.0 - 34.0 pg   MCHC 32.8 32.0 - 36.0 g/dL   RDW 84.613.8 96.211.5 - 95.214.5 %   Platelets 233 150 - 440 K/uL   Neutrophils Relative % 57 %   Neutro Abs 2.8 1.4 - 6.5 K/uL   Lymphocytes Relative 27 %   Lymphs Abs 1.3 1.0 - 3.6 K/uL   Monocytes Relative 11 %   Monocytes Absolute 0.5 0.2 - 1.0 K/uL   Eosinophils Relative 4 %   Eosinophils Absolute 0.2 0 - 0.7 K/uL   Basophils Relative 1 %   Basophils Absolute  0.0 0 - 0.1 K/uL      Assessment & Plan:   Problem List Items Addressed This Visit      Cardiovascular and Mediastinum   Chronic systolic CHF (congestive heart failure) (HCC) - Primary Status unknown.  Recheck labs.   Continue without meds today as BP does not support use of antihypertensives.  Patient may benefit from GMDT for CHF as managed by cardiology in future.  Needs new echocardiogram.  NO cardiac imaging since prior to 2017.  Followup after labs and in 3 months.    Relevant Orders   ECHOCARDIOGRAM COMPLETE   COMPLETE METABOLIC PANEL WITH GFR   B Nat Peptide     Respiratory   Emphysema of lung (HCC) Chronic COPD with emphysema.  Not currently controlled.  Very poor air flow on exam.  Not currently on medications.  Plan: 1. Resume Spiriva once daily.  Resume Advair 1 puff bid. 2. Resume rescue albuterol 1-2 puffs every 6 hr prn. 3. Will need repeat PFT for assessment, need to wait until patient has slightly improved control. 4. FOLLOW-UP 3 months.   Relevant Medications   tiotropium (SPIRIVA HANDIHALER) 18 MCG inhalation capsule   Fluticasone-Salmeterol (ADVAIR) 250-50 MCG/DOSE AEPB   albuterol (PROVENTIL HFA;VENTOLIN HFA) 108 (90 Base) MCG/ACT inhaler      Meds ordered this encounter  Medications  . tiotropium (SPIRIVA HANDIHALER) 18 MCG inhalation capsule    Sig: Place 1 capsule (18 mcg total) into inhaler and inhale daily.    Dispense:  30 capsule    Refill:  12    Order Specific Question:   Supervising Provider    Answer:   Smitty Cords [[id number]]  . Fluticasone-Salmeterol (ADVAIR) 250-50 MCG/DOSE AEPB    Sig: Inhale 1 puff into the lungs 2 (two) times daily.    Dispense:  60 each    Refill:  11    Order Specific Question:   Supervising Provider    Answer:   Smitty Cords [[id number]]  . albuterol (PROVENTIL HFA;VENTOLIN HFA) 108 (90 Base) MCG/ACT inhaler    Sig: Inhale 2 puffs into the lungs every 6 (six) hours as needed for wheezing or shortness of breath.    Dispense:  1 Inhaler    Refill:  0    Order Specific Question:   Supervising Provider    Answer:   Smitty Cords [[id number]]    Follow up plan: Return in about 3 months (around 09/23/2018) for COPD,  CHF.  Wilhelmina Mcardle, DNP, AGPCNP-BC Adult Gerontology Primary Care Nurse Practitioner [facility] 06/24/2018, 9:17 AM

## 2018-06-30 NOTE — Progress Notes (Deleted)
Cardiology Office Note  Date:  06/30/2018   ID:  Blake MantleLubby [name], DOB 1956/06/30, MRN [id number]  PCP:  Janeann ForehandHawkins, James H Jr., MD   No chief complaint on file.   HPI:  Blake [name] is a 62 year old gentleman with  long history of smoking who continues to smoke at least one pack per day, hypertension,  hepatitis,  Schizophrenia, coronary artery disease, cardiac catheterization October 2009 showing ejection fraction 30%,  severe anterolateral and severe apical hypokinesis, three-vessel coronary artery disease,  bypass surgery after MI in 2012,  shortness of breath on previous clinic visits,  in prison for 2 years and had no medications, May 2015 got out of prison. He had cardiac catheterization for chest tightness in 2016, this showed occluded vein graft to the OM and PDA He presents today for follow-up of his coronary artery disease  PMH:   has a past medical history of Acute headache, COPD (chronic obstructive pulmonary disease) (HCC), Coronary artery disease, Depression, Hepatitis B, Hepatitis C, Hyperlipidemia, Hypertension, MI (myocardial infarction) (HCC), and Tobacco abuse.  PSH:    Past Surgical History:  Procedure Laterality Date  . CARDIAC CATHETERIZATION    . CORONARY ANGIOPLASTY WITH STENT PLACEMENT  2009  . CORONARY ARTERY BYPASS GRAFT  2012   [facility]  . KNEE SURGERY     right knee     Current Outpatient Medications  Medication Sig Dispense Refill  . albuterol (PROVENTIL HFA;VENTOLIN HFA) 108 (90 Base) MCG/ACT inhaler Inhale 2 puffs into the lungs every 6 (six) hours as needed for wheezing or shortness of breath. 1 Inhaler 0  . aspirin 81 MG chewable tablet Chew 1 tablet (81 mg total) by mouth daily. (Patient not taking: Reported on 06/24/2018) 30 tablet 11  . Fluticasone-Salmeterol (ADVAIR) 250-50 MCG/DOSE AEPB Inhale 1 puff into the lungs 2 (two) times daily. 60 each 11  . furosemide (LASIX) 20 MG tablet Take 1 tablet (20 mg total) by mouth daily as  needed. (Patient not taking: Reported on 06/24/2018) 30 tablet 2  . loratadine (CLARITIN) 10 MG tablet Take 10 mg by mouth daily. Reported on 09/29/2015    . nitroGLYCERIN (NITROSTAT) 0.4 MG SL tablet Place 0.4 mg under the tongue. Reported on 09/29/2015    . tiotropium (SPIRIVA HANDIHALER) 18 MCG inhalation capsule Place 1 capsule (18 mcg total) into inhaler and inhale daily. 30 capsule 12   No current facility-administered medications for this visit.      Allergies:   Patient has no known allergies.   Social History:  The patient  reports that he has been smoking cigarettes. He has a 73.50 pack-year smoking history. He has never used smokeless tobacco. He reports previous drug use. Drug: Heroin. He reports that he does not drink alcohol.   Family History:   family history includes Hypertension in his father, mother, sister, and sister.    Review of Systems: ROS   PHYSICAL EXAM: VS:  There were no vitals taken for this visit. , BMI There is no height or weight on file to calculate BMI. GEN: Well nourished, well developed, in no acute distress HEENT: normal Neck: no JVD, carotid bruits, or masses Cardiac: RRR; no murmurs, rubs, or gallops,no edema  Respiratory:  clear to auscultation bilaterally, normal work of breathing GI: soft, nontender, nondistended, + BS MS: no deformity or atrophy Skin: warm and dry, no rash Neuro:  Strength and sensation are intact Psych: euthymic mood, full affect    Recent Labs: No results found for requested labs within  last 8760 hours.    Lipid Panel No results found for: CHOL, HDL, LDLCALC, TRIG    Wt Readings from Last 3 Encounters:  06/24/18 166 lb 3.2 oz (75.4 kg)  12/11/17 142 lb (64.4 kg)  04/19/17 140 lb (63.5 kg)       ASSESSMENT AND PLAN:  No diagnosis found.   Disposition:   F/U  6 months  No orders of the defined types were placed in this encounter.    Signed, Dossie Arbourim Jaleeya Mcnelly, M.D., Ph.D. 06/30/2018  [facility] [place] [phone]

## 2018-07-01 ENCOUNTER — Ambulatory Visit: Payer: Medicaid Other | Admitting: Cardiovascular Disease

## 2018-07-11 NOTE — Progress Notes (Deleted)
Cardiology Office Note Date:  07/12/2018  Patient ID:  Blake [name], DOB 01/19/1956, MRN [id number] PCP:  Janeann Forehand., MD  Cardiologist:  Dr. Mariah Milling, MD  ***refresh   Chief Complaint: ***  History of Present Illness: Said Blake [name] is a 63 y.o. male with history of CAD status post four-vessel CABG in 2012 status post PCI to the LCx in 2016 as outlined below, HFrEF secondary to ICM, COPD with ongoing tobacco abuse, hepatitis B/C, hypertension, hyperlipidemia, and schizophrenia who presents to re-establish care for his CAD and ICM.  Patient previously underwent cardiac cath in 04/2008 that showed three-vessel disease with recommendation for bypass surgery.  Details are unclear though it does not appear he followed up with this.  EF at that time was noted to be 32%, with severe anterolateral and severe apical hypokinesis.  He subsequently had an MI in 2012 and underwent four-vessel bypass with LIMA to LAD, VG to diagonal, VG to OM, VG to PDA.  Apparently, he was incarcerated for 2 years and had no medications per notes, being released from prison in 11/2013.  Most recent cardiac cath in 2016 showed an occluded vein graft to OM and vein graft to PDA, with a patent LIMA to LAD and patent vein graft to diagonal.  The native mid LCx was 95% stenosed and he underwent successful PCI/DES to the native left circumflex.  Details are unclear of the patient's EF at that time.  The patient was last seen by Dr. Mariah Milling in 2017 for follow-up of his CAD and ischemic cardiomyopathy.  At that time he denied any significant chest pain though did note chronic shortness of breath on exertion.  He continues to smoke 1 pack/day and was not interested in quitting.  Weight was 164 pounds.  He has since been lost to follow-up.  Patient sister came by our office in 05/2018 requesting his medical records and a letter stating the patient's cardiac diagnosis is so he can receive medications while  incarcerated.  Patient was seen by PCP on 06/24/2018 for follow-up of hypertension and needing medication refills.  He has been off all medications for "an extended period of time" and noted increased shortness of breath for the prior 3 weeks.  Blood pressure was noted to be 121/64 with a weight of 166 pounds.  He reported a regular exercise routine that include walking and biking 6 days/week without anginal symptoms.  He continues to smoke almost 2 packs daily.  CMP and BNP were recommended though not yet drawn.  Echocardiogram was ordered and is scheduled for 07/24/2018.  He was advised to follow-up with cardiology to reestablish care.  ***   Past Medical History:  Diagnosis Date  . Acute headache   . COPD (chronic obstructive pulmonary disease) (HCC)   . Coronary artery disease    a. cath 2009 w/ severe 3-vessel dz, rec bypass; b. MI 2012-->4-vessel cabg; c. cath 2016: occluded VG-OM and VG-RPDA, patent LIMA-LAD and patent VG-Diag, 95% stenosis of mid LCx s/p PCI/DES 0% residual stenosis  . Depression   . Hepatitis B   . Hepatitis C   . HFrEF (heart failure with reduced ejection fraction) (HCC)    a. LV gram 2009 with EF 32% with anterolateral and apical HK  . Hyperlipidemia   . Hypertension   . MI (myocardial infarction) (HCC)    x 2  . Tobacco abuse     Past Surgical History:  Procedure Laterality Date  . CARDIAC CATHETERIZATION    .  CORONARY ANGIOPLASTY WITH STENT PLACEMENT  2009  . CORONARY ARTERY BYPASS GRAFT  2012   [facility]  . KNEE SURGERY     right knee     No outpatient medications have been marked as taking for the 07/12/18 encounter (Appointment) with Sondra Bargesunn, Raesean Bartoletti M, PA-C.    Allergies:   Patient has no known allergies.   Social History:  The patient  reports that he has been smoking cigarettes. He has a 73.50 pack-year smoking history. He has never used smokeless tobacco. He reports previous drug use. Drug: Heroin. He reports that he does not drink alcohol.   Family  History:  The patient's family history includes Hypertension in his father, mother, sister, and sister.  ROS:   ROS   PHYSICAL EXAM: *** VS:  There were no vitals taken for this visit. BMI: There is no height or weight on file to calculate BMI.  Physical Exam   EKG:  Was ordered and interpreted by me today. Shows ***  Recent Labs: No results found for requested labs within last 8760 hours.  No results found for requested labs within last 8760 hours.   CrCl cannot be calculated (Patient's most recent lab result is older than the maximum 21 days allowed.).   Wt Readings from Last 3 Encounters:  06/24/18 166 lb 3.2 oz (75.4 kg)  12/11/17 142 lb (64.4 kg)  04/19/17 140 lb (63.5 kg)     Other studies reviewed: Additional studies/records reviewed today include: summarized above  ASSESSMENT AND PLAN:  1. ***  Disposition: F/u with Dr. Mariah MillingGollan or an APP in ***  Current medicines are reviewed at length with the patient today.  The patient did not have any concerns regarding medicines.  Signed, Eula Listenyan Mcdonald Reiling, PA-C 07/12/2018 11:26 AM     [facility] [address] [place], [place] [address] [phone]

## 2018-07-12 ENCOUNTER — Ambulatory Visit: Payer: Medicaid Other | Admitting: Physician Assistant

## 2018-07-12 ENCOUNTER — Encounter: Payer: Self-pay | Admitting: Physician Assistant

## 2018-07-15 ENCOUNTER — Encounter: Payer: Self-pay | Admitting: Physician Assistant

## 2018-07-22 ENCOUNTER — Ambulatory Visit: Payer: Medicaid Other | Admitting: Cardiovascular Disease

## 2018-07-24 ENCOUNTER — Other Ambulatory Visit: Payer: Medicaid Other

## 2018-08-28 ENCOUNTER — Telehealth: Payer: Self-pay | Admitting: Nurse Practitioner

## 2018-08-28 NOTE — Telephone Encounter (Signed)
Blake [name] at [facility] needs last office notes faxed to [phone].

## 2018-08-28 NOTE — Telephone Encounter (Signed)
Notes faxed over to  [facility].

## 2018-09-04 ENCOUNTER — Other Ambulatory Visit: Payer: Self-pay

## 2018-09-04 ENCOUNTER — Encounter: Payer: Self-pay | Admitting: Nurse Practitioner

## 2018-09-04 ENCOUNTER — Ambulatory Visit (INDEPENDENT_AMBULATORY_CARE_PROVIDER_SITE_OTHER): Payer: Medicaid Other | Admitting: Nurse Practitioner

## 2018-09-04 VITALS — BP 145/59 | HR 75 | Temp 98.2°F | Resp 18 | Ht 67.0 in | Wt 162.0 lb

## 2018-09-04 DIAGNOSIS — I10 Essential (primary) hypertension: Secondary | ICD-10-CM

## 2018-09-04 DIAGNOSIS — J302 Other seasonal allergic rhinitis: Secondary | ICD-10-CM

## 2018-09-04 DIAGNOSIS — J432 Centrilobular emphysema: Secondary | ICD-10-CM

## 2018-09-04 DIAGNOSIS — I5022 Chronic systolic (congestive) heart failure: Secondary | ICD-10-CM

## 2018-09-04 MED ORDER — TIOTROPIUM BROMIDE MONOHYDRATE 18 MCG IN CAPS: 18.0000 ug | ORAL_CAPSULE | Freq: Every day | RESPIRATORY_TRACT | 12 refills | Status: DC

## 2018-09-04 MED ORDER — ALBUTEROL SULFATE HFA 108 (90 BASE) MCG/ACT IN AERS: 2.0000 | INHALATION_SPRAY | Freq: Four times a day (QID) | RESPIRATORY_TRACT | 0 refills | Status: DC | PRN

## 2018-09-04 MED ORDER — LORATADINE 10 MG PO TABS: 10.0000 mg | ORAL_TABLET | Freq: Every day | ORAL | 1 refills | Status: DC

## 2018-09-04 MED ORDER — FLUTICASONE-SALMETEROL 250-50 MCG/DOSE IN AEPB: 1.0000 | INHALATION_SPRAY | Freq: Two times a day (BID) | RESPIRATORY_TRACT | 11 refills | Status: DC

## 2018-09-04 MED ORDER — FUROSEMIDE 20 MG PO TABS: 20.0000 mg | ORAL_TABLET | Freq: Every day | ORAL | 2 refills | Status: DC | PRN

## 2018-09-04 MED ORDER — ASPIRIN 81 MG PO CHEW: 81.0000 mg | CHEWABLE_TABLET | Freq: Every day | ORAL | 11 refills | Status: DC

## 2018-09-04 NOTE — Patient Instructions (Addendum)
Blake [name],   Thank you for coming in to clinic today.  1. Continue all medicines as ordered.  2. Get back on your fluid pill and inhalers daily.  3. LABS tomorrow morning in clinic.  Please schedule a follow-up appointment with Wilhelmina Mcardle, AGNP. Return in about 3 months (around 12/03/2018) for COPD, hypertension, CHF.  If you have any other questions or concerns, please feel free to call the clinic or send a message through MyChart. You may also schedule an earlier appointment if necessary.  You will receive a survey after today's visit either digitally by e-mail or paper by [employer]. Your experiences and feedback matter to [place].  Please respond so we know how we are doing as we provide care for you.   Wilhelmina Mcardle, DNP, AGNP-BC Adult Gerontology Nurse Practitioner [facility], [facility]

## 2018-09-04 NOTE — Progress Notes (Signed)
Subjective:    Patient ID: Blake [name], male    DOB: 12-21-1955, 63 y.o.   MRN: [id number]  Blake [name] is a 63 y.o. male presenting on 09/04/2018 for COPD (pt need to get all his medications refill)   HPI COPD Is having some shortness of breath now, but is not taking any medicines right now.  Last time he took medicines was about 1 week ago.   - COPD/CHF - patient is not taking furosemide either. - Patient reports no paroxysmal nocturnal dyspnea. Patient has past use of recreational drugs, but none recently per patient report.    Hypertension - He is not checking BP at home or outside of clinic.   - Current medications: Furosemide 20 mg once daily, not currently taking - He is not currently symptomatic. - Pt denies headache, lightheadedness, dizziness, changes in vision, chest tightness/pressure, palpitations, leg swelling, sudden loss of speech or loss of consciousness. - He  reports no regular exercise routine. - His diet is high in salt, high in fat, and high in carbohydrates.     Social History   Tobacco Use  . Smoking status: Current Every Day Smoker    Packs/day: 1.00    Years: 42.00    Pack years: 42.00    Types: Cigarettes  . Smokeless tobacco: Never Used  Substance Use Topics  . Alcohol use: No  . 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    Review of Systems Per HPI unless specifically indicated above     Objective:    BP (!) 145/59 (BP Location: Right Arm, Patient Position: Sitting, Cuff Size: Normal)   Pulse 75   Temp 98.2 F (36.8 C) (Oral)   Resp 18   Ht 5\' 7"  (1.702 m)   Wt 162 lb (73.5 kg)   SpO2 96%   BMI 25.37 kg/m   Wt Readings from Last 3 Encounters:  09/04/18 162 lb (73.5 kg)  06/24/18 166 lb 3.2 oz (75.4 kg)  12/11/17 142 lb (64.4 kg)    Physical Exam Vitals signs reviewed.  Constitutional:      General: He is not in acute distress.    Appearance: He is well-developed.  HENT:     Head: Normocephalic and atraumatic.    Cardiovascular:     Rate and Rhythm: Normal rate and regular rhythm.     Pulses:          Radial pulses are 2+ on the right side and 2+ on the left side.       Posterior tibial pulses are 1+ on the right side and 1+ on the left side.     Heart sounds: Normal heart sounds, S1 normal and S2 normal.  Pulmonary:     Effort: Pulmonary effort is normal. No respiratory distress.     Breath sounds: Normal air entry. No stridor. Wheezing, rhonchi and rales present.  Musculoskeletal:     Right lower leg: No edema.     Left lower leg: No edema.  Skin:    General: Skin is warm and dry.     Capillary Refill: Capillary refill takes less than 2 seconds.  Neurological:     General: No focal deficit present.     Mental Status: He is alert and oriented to person, place, and time. Mental status is at baseline.  Psychiatric:        Attention and Perception: Attention normal.        Mood and Affect: Mood and affect normal.  Behavior: Behavior normal. Behavior is cooperative.        Thought Content: Thought content normal.        Judgment: Judgment normal.     Results for orders placed or performed during the hospital encounter of 04/19/17  Urinalysis, Complete w Microscopic  Result Value Ref Range   Color, Urine YELLOW (A) YELLOW   APPearance CLEAR (A) CLEAR   Specific Gravity, Urine 1.018 1.005 - 1.030   pH 6.0 5.0 - 8.0   Glucose, UA NEGATIVE NEGATIVE mg/dL   Hgb urine dipstick NEGATIVE NEGATIVE   Bilirubin Urine NEGATIVE NEGATIVE   Ketones, ur NEGATIVE NEGATIVE mg/dL   Protein, ur NEGATIVE NEGATIVE mg/dL   Nitrite NEGATIVE NEGATIVE   Leukocytes, UA NEGATIVE NEGATIVE   RBC / HPF 0-5 0 - 5 RBC/hpf   WBC, UA 6-30 0 - 5 WBC/hpf   Bacteria, UA NONE SEEN NONE SEEN   Squamous Epithelial / LPF 0-5 (A) NONE SEEN   Mucus PRESENT   Comprehensive metabolic panel  Result Value Ref Range   Sodium 137 135 - 145 mmol/L   Potassium 3.9 3.5 - 5.1 mmol/L   Chloride 105 101 - 111 mmol/L   CO2 27  22 - 32 mmol/L   Glucose, Bld 91 65 - 99 mg/dL   BUN 12 6 - 20 mg/dL   Creatinine, Ser 7.82 0.61 - 1.24 mg/dL   Calcium 8.8 (L) 8.9 - 10.3 mg/dL   Total Protein 7.5 6.5 - 8.1 g/dL   Albumin 3.3 (L) 3.5 - 5.0 g/dL   AST 30 15 - 41 U/L   ALT 23 17 - 63 U/L   Alkaline Phosphatase 100 38 - 126 U/L   Total Bilirubin 0.6 0.3 - 1.2 mg/dL   GFR calc non Af Amer >60 >60 mL/min   GFR calc Af Amer >60 >60 mL/min   Anion gap 5 5 - 15  Lipase, blood  Result Value Ref Range   Lipase 16 11 - 51 U/L  CBC with Differential  Result Value Ref Range   WBC 4.8 3.8 - 10.6 K/uL   RBC 4.74 4.40 - 5.90 MIL/uL   Hemoglobin 14.6 13.0 - 18.0 g/dL   HCT 95.6 21.3 - 08.6 %   MCV 94.2 80.0 - 100.0 fL   MCH 30.9 26.0 - 34.0 pg   MCHC 32.8 32.0 - 36.0 g/dL   RDW 57.8 46.9 - 62.9 %   Platelets 233 150 - 440 K/uL   Neutrophils Relative % 57 %   Neutro Abs 2.8 1.4 - 6.5 K/uL   Lymphocytes Relative 27 %   Lymphs Abs 1.3 1.0 - 3.6 K/uL   Monocytes Relative 11 %   Monocytes Absolute 0.5 0.2 - 1.0 K/uL   Eosinophils Relative 4 %   Eosinophils Absolute 0.2 0 - 0.7 K/uL   Basophils Relative 1 %   Basophils Absolute 0.0 0 - 0.1 K/uL      Assessment & Plan:   Problem List Items Addressed This Visit      Cardiovascular and Mediastinum   Chronic systolic CHF (congestive heart failure) (HCC) Stable today on exam.  Shortness of breath is most likely due only to COPD as patient is not inactive COPD exacerbation.  Patient with mild fluid overload with hepatojugular reflux.  - Resume furosemide daily - Continue aspirin - Follow-up 3 months.   Relevant Medications   furosemide (LASIX) 20 MG tablet   aspirin 81 MG chewable tablet   Other Relevant Orders  B Nat Peptide   Comprehensive metabolic panel   Essential (primary) hypertension Uncontrolled hypertension.  BP goal < 130/80.  Pt is not working on lifestyle modifications.  Taking medications tolerating well without side effects.  Complications:CHF  Plan: 1. Continue taking furosemide 20 mg once daily 2. Obtain labs in next 7 days  3. Encouraged heart healthy diet and increasing exercise to 30 minutes most days of the week. 4. Check BP 1-2 x per week at home, keep log, and bring to clinic at next appointment. 5. Follow up 3 months.     Relevant Medications   furosemide (LASIX) 20 MG tablet   aspirin 81 MG chewable tablet   Other Relevant Orders   Comprehensive metabolic panel     Respiratory   Emphysema of lung (HCC) COPD currently uncontrolled off maintenance medications.  Current status severity unknown.  No recent PFT.  Plan: 1. Encouraged patient to discuss not picking up meds from sister.  Instead, use pharmacy directly. 2. Refills sent to pharmacy.   3. PFT ordered to assess current status. 4. Follow-up 3 months or sooner if needed.   Relevant Medications   albuterol (PROVENTIL HFA;VENTOLIN HFA) 108 (90 Base) MCG/ACT inhaler   Fluticasone-Salmeterol (ADVAIR) 250-50 MCG/DOSE AEPB   tiotropium (SPIRIVA HANDIHALER) 18 MCG inhalation capsule   loratadine (CLARITIN) 10 MG tablet   Other Relevant Orders   Pulmonary Function Test [facility] Only    Other Visit Diagnoses    Seasonal allergies    -  Primary Currently stable on loratadine.  Continue meds without changes.  Refill provided.  Follow-up prn.   Relevant Medications   loratadine (CLARITIN) 10 MG tablet      Meds ordered this encounter  Medications  . furosemide (LASIX) 20 MG tablet    Sig: Take 1 tablet (20 mg total) by mouth daily as needed.    Dispense:  30 tablet    Refill:  2    Order Specific Question:   Supervising Provider    Answer:   Smitty Cords [[id number]]  . aspirin 81 MG chewable tablet    Sig: Chew 1 tablet (81 mg total) by mouth daily.    Dispense:  30 tablet    Refill:  11    Order Specific Question:   Supervising Provider    Answer:   Smitty Cords [[id number]]  . albuterol (PROVENTIL HFA;VENTOLIN HFA) 108 (90  Base) MCG/ACT inhaler    Sig: Inhale 2 puffs into the lungs every 6 (six) hours as needed for wheezing or shortness of breath.    Dispense:  1 Inhaler    Refill:  0    Order Specific Question:   Supervising Provider    Answer:   Smitty Cords [[id number]]  . Fluticasone-Salmeterol (ADVAIR) 250-50 MCG/DOSE AEPB    Sig: Inhale 1 puff into the lungs 2 (two) times daily.    Dispense:  60 each    Refill:  11    Order Specific Question:   Supervising Provider    Answer:   Smitty Cords [[id number]]  . tiotropium (SPIRIVA HANDIHALER) 18 MCG inhalation capsule    Sig: Place 1 capsule (18 mcg total) into inhaler and inhale daily.    Dispense:  30 capsule    Refill:  12    Order Specific Question:   Supervising Provider    Answer:   Smitty Cords [[id number]]  . loratadine (CLARITIN) 10 MG tablet    Sig: Take 1 tablet (10 mg total) by mouth  daily.    Dispense:  90 tablet    Refill:  1    Order Specific Question:   Supervising Provider    Answer:   Smitty Cords [[id number]]    Follow up plan: Return in about 3 months (around 12/03/2018) for COPD, hypertension, CHF.  Wilhelmina Mcardle, DNP, AGPCNP-BC Adult Gerontology Primary Care Nurse Practitioner [facility] 09/04/2018, 2:03 PM

## 2018-09-07 ENCOUNTER — Encounter: Payer: Self-pay | Admitting: Nurse Practitioner

## 2018-09-12 ENCOUNTER — Other Ambulatory Visit: Payer: Medicaid Other

## 2018-12-05 ENCOUNTER — Ambulatory Visit: Payer: Medicaid Other | Admitting: Nurse Practitioner

## 2018-12-17 ENCOUNTER — Other Ambulatory Visit: Payer: Self-pay

## 2018-12-17 ENCOUNTER — Ambulatory Visit: Payer: Medicaid Other | Admitting: Family Medicine

## 2019-01-09 ENCOUNTER — Telehealth: Payer: Self-pay | Admitting: Cardiovascular Disease

## 2019-01-09 NOTE — Telephone Encounter (Signed)
Pt c/o Shortness Of Breath: STAT if SOB developed within the last 24 hours or pt is noticeably SOB on the phone  1. Are you currently SOB (can you hear that pt is SOB on the phone)? yes  2. How long have you been experiencing SOB? 1 week  3. Are you SOB when sitting or when up moving around? Up moving around   4. Are you currently experiencing any other symptoms? Looks "tired in his face". Sister states he is still smoking   Patient is scheduled to see Christell Faith, PA 7/7

## 2019-01-09 NOTE — Telephone Encounter (Signed)
Attempted to call patient. LMTCB 01/09/2019

## 2019-01-13 NOTE — Progress Notes (Deleted)
Cardiology Office Note Date:  01/13/2019  Patient ID:  Blake [name], DOB September 10, 1955, MRN [id number] PCP:  Mikey College, NP  Cardiologist:  Dr. Rockey Situ, MD  ***refresh   Chief Complaint: ***  History of Present Illness: Blake [name] is a 63 y.o. male with history of CAD s/p 4-vessel CABG (LIMA-LAD, SVG-diag, SVG-OM, SVG-PDA) after MI in 2012, HFrEF secondary to ICM with EF of 32% by LV gram in 2009, COPD with long standing tobacco abuse with continued use, HTN, HLD, hepatitis C, schizophrenia, prior incarceration with notes indicating no medications while in prison  He was evaluated in 2016 with chest tightness and underwent LHC that showed severe underlying 3-vessel CAD with patent LIMA to LAD, patent SVG to diagonal, occluded SVG to OM, occluded SVG to PDA, occluded native RCA with collaterals from left to right, severe stenosis of the mid LCx estimated at 95% at the take off of the AV groove circumflex. He underwent successful PCI to the native LCx with residual 60% stenosis in the AV groove with normal flow. EF was not assess at that time. He was last seen in 09/2015 and has been lost to follow up since. PCP ordered an echo in 06/2018, though the patient did not follow through with this.   His sister called on 7/2 noting increased SOB x 1 week and that he "looked tired in his face." Upon our office attempting to call him back for details, we were unable to reach him.   ***  No recent labs for review.    Past Medical History:  Diagnosis Date  . Acute headache   . COPD (chronic obstructive pulmonary disease) ([place])   . Coronary artery disease    a. cath 2009 w/ severe 3-vessel dz, rec bypass; b. MI 2012-->4-vessel cabg; c. cath 2016: occluded VG-OM and VG-RPDA, patent LIMA-LAD and patent VG-Diag, 95% stenosis of mid LCx s/p PCI/DES 0% residual stenosis  . Depression   . Hepatitis B   . Hepatitis C   . HFrEF (heart failure with reduced ejection fraction) ([place])    a.  LV gram 2009 with EF 32% with anterolateral and apical HK  . Hyperlipidemia   . Hypertension   . MI (myocardial infarction) ([place])    x 2  . Tobacco abuse     Past Surgical History:  Procedure Laterality Date  . CARDIAC CATHETERIZATION    . CORONARY ANGIOPLASTY WITH STENT PLACEMENT  2009  . CORONARY ARTERY BYPASS GRAFT  2012   [facility]  . KNEE SURGERY     right knee     No outpatient medications have been marked as taking for the 01/14/19 encounter (Appointment) with Rise Mu, PA-C.    Allergies:   Patient has no known allergies.   Social History:  The patient  reports that he has been smoking cigarettes. He has a 42.00 pack-year smoking history. He has never used smokeless tobacco. He reports previous drug use. Drug: Heroin. He reports that he does not drink alcohol.   Family History:  The patient's family history includes Hypertension in his father, mother, sister, and sister.  ROS:   ROS   PHYSICAL EXAM: *** VS:  There were no vitals taken for this visit. BMI: There is no height or weight on file to calculate BMI.  Physical Exam   EKG:  Was ordered and interpreted by me today. Shows ***  Recent Labs: No results found for requested labs within last 8760 hours.  No results found for requested  labs within last 8760 hours.   CrCl cannot be calculated (Patient's most recent lab result is older than the maximum 21 days allowed.).   Wt Readings from Last 3 Encounters:  09/04/18 162 lb (73.5 kg)  06/24/18 166 lb 3.2 oz (75.4 kg)  12/11/17 142 lb (64.4 kg)     Other studies reviewed: Additional studies/records reviewed today include: summarized above  ASSESSMENT AND PLAN:  1. ***  Disposition: F/u with Dr. Marland Kitchen*** or an APP in ***.  Current medicines are reviewed at length with the patient today.  The patient did not have any concerns regarding medicines.  Signed, Eula Listenyan Deneisha Dade, PA-C 01/13/2019 4:04 PM     [facility] [place] [address]  [place], [place] [address] [phone]

## 2019-01-14 ENCOUNTER — Ambulatory Visit: Payer: Medicaid Other | Admitting: Physician Assistant

## 2019-01-14 NOTE — Telephone Encounter (Signed)
Call to patient. He was d/t see Christell Faith, PA this morning for c/o SOB. NO answer, went straight to VM. No other numbers on file.   Attempted to call patient. LMTCB 01/14/2019

## 2019-01-22 NOTE — Telephone Encounter (Signed)
Pt has upcoming appointment with his PCP.  Will enter recall letter.

## 2019-01-27 ENCOUNTER — Other Ambulatory Visit: Payer: Self-pay

## 2019-01-27 ENCOUNTER — Encounter: Payer: Self-pay | Admitting: Nurse Practitioner

## 2019-01-27 ENCOUNTER — Ambulatory Visit (INDEPENDENT_AMBULATORY_CARE_PROVIDER_SITE_OTHER): Payer: Medicaid Other | Admitting: Nurse Practitioner

## 2019-01-27 DIAGNOSIS — J432 Centrilobular emphysema: Secondary | ICD-10-CM | POA: Diagnosis not present

## 2019-01-27 DIAGNOSIS — I10 Essential (primary) hypertension: Secondary | ICD-10-CM | POA: Diagnosis not present

## 2019-01-27 MED ORDER — ALBUTEROL SULFATE HFA 108 (90 BASE) MCG/ACT IN AERS: 2.0000 | INHALATION_SPRAY | Freq: Four times a day (QID) | RESPIRATORY_TRACT | 1 refills | Status: DC | PRN

## 2019-01-27 NOTE — Progress Notes (Signed)
Telemedicine Encounter: Disclosed to patient at start of encounter that we will provide appropriate telemedicine services.  Patient consents to be treated via phone prior to discussion. - Patient is at his home and is accessed via telephone. - Services are provided by Wilhelmina McardleLauren Elody Kleinsasser from [facility].  Subjective:    Patient ID: Blake [name], male    DOB: [birth year], 63 y.o.   MRN: [id number]  [name] is a 63 y.o. male presenting on 01/27/2019 for COPD and Hypertension  HPI COPD Patient reports he is breathing okay.  Takes rest breaks after about 2 hours of light activity. Moderate activity is tolerated pretty well also.  Patient reports taking his "round one and straight one" inhalers.  He only has 2 inhalers.  Does not know names, but states they were prescribed by me which are Spiriva and Advair. Since taking these regularly, he reports significantly improved breathing.  Has no compliants about his breathing today.  Patient continues to smoke cigarettes 1ppd.  Is not interested in quitting.  Hypertension Patient has no BP readings from home.  Continues to take furosemide only.   - He is not currently symptomatic. - Pt denies headache, lightheadedness, dizziness, changes in vision, chest tightness/pressure, palpitations, leg swelling, sudden loss of speech or loss of consciousness. - He  is regularly active with walking and work.. - His diet is high in salt, high in fat, and high in carbohydrates.   Patient also reports his "knee gives out."  He denies any regular pain.  Recommend face to face for proper eval and patient verbalizes understanding.  Social History   Tobacco Use  . Smoking status: Current Every Day Smoker    Packs/day: 1.00    Years: 42.00    Pack years: 42.00    Types: Cigarettes  . Smokeless tobacco: Never Used  Substance Use Topics  . Alcohol use: No  . 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  Wt Readings from Last 3 Encounters:  09/04/18 162 lb (73.5 kg)  06/24/18 166 lb 3.2 oz (75.4 kg)  12/11/17 142 lb (64.4 kg)    Physical Exam Patient remotely monitored.  Verbal communication appropriate.  Cognition normal.   Results for orders placed or performed during the hospital encounter of 04/19/17  Urinalysis, Complete w Microscopic  Result Value Ref Range   Color, Urine YELLOW (A) YELLOW   APPearance CLEAR (A) CLEAR   Specific Gravity, Urine 1.018 1.005 - 1.030   pH 6.0 5.0 - 8.0   Glucose, UA NEGATIVE NEGATIVE mg/dL   Hgb urine dipstick NEGATIVE NEGATIVE   Bilirubin Urine NEGATIVE NEGATIVE   Ketones, ur NEGATIVE NEGATIVE mg/dL   Protein, ur NEGATIVE NEGATIVE mg/dL   Nitrite NEGATIVE NEGATIVE   Leukocytes, UA NEGATIVE NEGATIVE   RBC / HPF 0-5 0 - 5 RBC/hpf   WBC, UA 6-30 0 - 5 WBC/hpf   Bacteria, UA NONE SEEN NONE SEEN   Squamous Epithelial / LPF 0-5 (A) NONE SEEN   Mucus PRESENT   Comprehensive metabolic panel  Result Value Ref Range   Sodium 137 135 - 145 mmol/L   Potassium 3.9 3.5 - 5.1 mmol/L   Chloride 105 101 - 111 mmol/L   CO2 27 22 - 32 mmol/L   Glucose, Bld 91 65 - 99 mg/dL   BUN  12 6 - 20 mg/dL   Creatinine, Ser 1.06 0.61 - 1.24 mg/dL   Calcium 8.8 (L) 8.9 - 10.3 mg/dL   Total Protein 7.5 6.5 - 8.1 g/dL   Albumin 3.3 (L) 3.5 - 5.0 g/dL   AST 30 15 - 41 U/L   ALT 23 17 - 63 U/L   Alkaline Phosphatase 100 38 - 126 U/L   Total Bilirubin 0.6 0.3 - 1.2 mg/dL   GFR calc non Af Amer >60 >60 mL/min   GFR calc Af Amer >60 >60 mL/min   Anion gap 5 5 - 15  Lipase, blood  Result Value Ref Range   Lipase 16 11 - 51 U/L  CBC with Differential  Result Value Ref Range   WBC 4.8 3.8 - 10.6 K/uL   RBC 4.74 4.40 - 5.90 MIL/uL   Hemoglobin 14.6 13.0 - 18.0 g/dL   HCT 44.7 40.0 - 52.0 %   MCV 94.2 80.0 - 100.0 fL   MCH 30.9 26.0 - 34.0 pg   MCHC 32.8 32.0 -  36.0 g/dL   RDW 13.8 11.5 - 14.5 %   Platelets 233 150 - 440 K/uL   Neutrophils Relative % 57 %   Neutro Abs 2.8 1.4 - 6.5 K/uL   Lymphocytes Relative 27 %   Lymphs Abs 1.3 1.0 - 3.6 K/uL   Monocytes Relative 11 %   Monocytes Absolute 0.5 0.2 - 1.0 K/uL   Eosinophils Relative 4 %   Eosinophils Absolute 0.2 0 - 0.7 K/uL   Basophils Relative 1 %   Basophils Absolute 0.0 0 - 0.1 K/uL      Assessment & Plan:   Problem List Items Addressed This Visit      Cardiovascular and Mediastinum   Essential (primary) hypertension Status unknown.  Recheck labs at next appointment in office for knee (in next 2-4 weeks).  Continue furosemide without changes today.  Refills provided. Followup 3 months.     Respiratory   Emphysema of lung ([place]) - Primary Stable today on exam.  Medications tolerated without side effects.  Continue at current doses.  Refills provided.  Recommend use of rescue inhaler albuterol.  Refill provided as pt is not currently using this med. Followup 3 months.    Relevant Medications   albuterol (VENTOLIN HFA) 108 (90 Base) MCG/ACT inhaler      No orders of the defined types were placed in this encounter.  - Time spent in direct consultation with patient via telemedicine about above concerns: 8 minutes  Follow up plan: No follow-ups on file.  Cassell Smiles, DNP, AGPCNP-BC Adult Gerontology Primary Care Nurse Practitioner [facility] 01/27/2019, 1:28 PM

## 2019-01-27 NOTE — Patient Instructions (Signed)
Recommended patient start using albuterol as rescue inhaler prn.  Not currently using. Continue Spiriva and Advair

## 2019-03-21 ENCOUNTER — Other Ambulatory Visit: Payer: Medicaid Other

## 2019-05-01 ENCOUNTER — Ambulatory Visit: Payer: Medicaid Other | Admitting: Nurse Practitioner

## 2019-06-08 ENCOUNTER — Encounter: Payer: Self-pay | Admitting: Intensive Care

## 2019-06-08 ENCOUNTER — Observation Stay
Admission: EM | Admit: 2019-06-08 | Discharge: 2019-06-09 | Disposition: A | Discharge status: Home / Self Care | Payer: Medicare Other | Attending: Internal Medicine | Admitting: Internal Medicine

## 2019-06-08 ENCOUNTER — Observation Stay (HOSPITAL_BASED_OUTPATIENT_CLINIC_OR_DEPARTMENT_OTHER)
Admit: 2019-06-08 | Discharge: 2019-06-08 | Disposition: A | Discharge status: Home / Self Care | Payer: Medicare Other | Attending: Internal Medicine | Admitting: Internal Medicine

## 2019-06-08 ENCOUNTER — Emergency Department: Payer: Medicare Other

## 2019-06-08 ENCOUNTER — Other Ambulatory Visit: Payer: Self-pay

## 2019-06-08 DIAGNOSIS — Z20828 Contact with and (suspected) exposure to other viral communicable diseases: Secondary | ICD-10-CM | POA: Insufficient documentation

## 2019-06-08 DIAGNOSIS — R079 Chest pain, unspecified: Secondary | ICD-10-CM | POA: Diagnosis present

## 2019-06-08 DIAGNOSIS — E7800 Pure hypercholesterolemia, unspecified: Secondary | ICD-10-CM | POA: Diagnosis not present

## 2019-06-08 DIAGNOSIS — I5022 Chronic systolic (congestive) heart failure: Secondary | ICD-10-CM | POA: Insufficient documentation

## 2019-06-08 DIAGNOSIS — R7989 Other specified abnormal findings of blood chemistry: Secondary | ICD-10-CM

## 2019-06-08 DIAGNOSIS — I361 Nonrheumatic tricuspid (valve) insufficiency: Secondary | ICD-10-CM

## 2019-06-08 DIAGNOSIS — Z7951 Long term (current) use of inhaled steroids: Secondary | ICD-10-CM | POA: Diagnosis not present

## 2019-06-08 DIAGNOSIS — I25119 Atherosclerotic heart disease of native coronary artery with unspecified angina pectoris: Secondary | ICD-10-CM | POA: Diagnosis not present

## 2019-06-08 DIAGNOSIS — K219 Gastro-esophageal reflux disease without esophagitis: Secondary | ICD-10-CM | POA: Insufficient documentation

## 2019-06-08 DIAGNOSIS — Z79899 Other long term (current) drug therapy: Secondary | ICD-10-CM | POA: Diagnosis not present

## 2019-06-08 DIAGNOSIS — F329 Major depressive disorder, single episode, unspecified: Secondary | ICD-10-CM | POA: Insufficient documentation

## 2019-06-08 DIAGNOSIS — I25110 Atherosclerotic heart disease of native coronary artery with unstable angina pectoris: Principal | ICD-10-CM | POA: Insufficient documentation

## 2019-06-08 DIAGNOSIS — I340 Nonrheumatic mitral (valve) insufficiency: Secondary | ICD-10-CM | POA: Diagnosis not present

## 2019-06-08 DIAGNOSIS — J439 Emphysema, unspecified: Secondary | ICD-10-CM | POA: Diagnosis present

## 2019-06-08 DIAGNOSIS — J410 Simple chronic bronchitis: Secondary | ICD-10-CM

## 2019-06-08 DIAGNOSIS — E785 Hyperlipidemia, unspecified: Secondary | ICD-10-CM | POA: Diagnosis present

## 2019-06-08 DIAGNOSIS — F17210 Nicotine dependence, cigarettes, uncomplicated: Secondary | ICD-10-CM | POA: Insufficient documentation

## 2019-06-08 DIAGNOSIS — I110 Hypertensive heart disease with heart failure: Secondary | ICD-10-CM | POA: Diagnosis not present

## 2019-06-08 DIAGNOSIS — R778 Other specified abnormalities of plasma proteins: Secondary | ICD-10-CM

## 2019-06-08 DIAGNOSIS — I252 Old myocardial infarction: Secondary | ICD-10-CM | POA: Diagnosis not present

## 2019-06-08 DIAGNOSIS — J449 Chronic obstructive pulmonary disease, unspecified: Secondary | ICD-10-CM | POA: Insufficient documentation

## 2019-06-08 DIAGNOSIS — I255 Ischemic cardiomyopathy: Secondary | ICD-10-CM | POA: Insufficient documentation

## 2019-06-08 DIAGNOSIS — Z7982 Long term (current) use of aspirin: Secondary | ICD-10-CM | POA: Insufficient documentation

## 2019-06-08 DIAGNOSIS — I10 Essential (primary) hypertension: Secondary | ICD-10-CM | POA: Diagnosis present

## 2019-06-08 DIAGNOSIS — I25118 Atherosclerotic heart disease of native coronary artery with other forms of angina pectoris: Secondary | ICD-10-CM | POA: Diagnosis present

## 2019-06-08 DIAGNOSIS — J432 Centrilobular emphysema: Secondary | ICD-10-CM | POA: Diagnosis present

## 2019-06-08 DIAGNOSIS — Z951 Presence of aortocoronary bypass graft: Secondary | ICD-10-CM

## 2019-06-08 HISTORY — DX: Heart failure, unspecified: I50.9

## 2019-06-08 LAB — CBC
HCT: 42.7 % (ref 39.0–52.0)
HCT: 41.8 % (ref 39.0–52.0)
Hemoglobin: 14 g/dL (ref 13.0–17.0)
Hemoglobin: 13.9 g/dL (ref 13.0–17.0)
MCH: 30 pg (ref 26.0–34.0)
MCH: 30.1 pg (ref 26.0–34.0)
MCHC: 32.8 g/dL (ref 30.0–36.0)
MCHC: 33.3 g/dL (ref 30.0–36.0)
MCV: 91.4 fL (ref 80.0–100.0)
MCV: 90.5 fL (ref 80.0–100.0)
Platelets: 243 10*3/uL (ref 150–400)
Platelets: 232 10*3/uL (ref 150–400)
RBC: 4.67 MIL/uL (ref 4.22–5.81)
RBC: 4.62 MIL/uL (ref 4.22–5.81)
RDW: 13 % (ref 11.5–15.5)
RDW: 13 % (ref 11.5–15.5)
WBC: 6.1 10*3/uL (ref 4.0–10.5)
WBC: 4.4 10*3/uL (ref 4.0–10.5)
nRBC: 0 % (ref 0.0–0.2)
nRBC: 0 % (ref 0.0–0.2)

## 2019-06-08 LAB — TSH: TSH: 0.729 u[IU]/mL (ref 0.350–4.500)

## 2019-06-08 LAB — LIPID PANEL
Cholesterol: 144 mg/dL (ref 0–200)
HDL: 38 mg/dL — ABNORMAL LOW (ref >40)
LDL Cholesterol: 96 mg/dL (ref 0–99)
Total CHOL/HDL Ratio: 3.8 RATIO
Triglycerides: 48 mg/dL (ref <150)
VLDL: 10 mg/dL (ref 0–40)

## 2019-06-08 LAB — BASIC METABOLIC PANEL
Anion gap: 10 (ref 5–15)
BUN: 20 mg/dL (ref 8–23)
CO2: 26 mmol/L (ref 22–32)
Calcium: 9.2 mg/dL (ref 8.9–10.3)
Chloride: 102 mmol/L (ref 98–111)
Creatinine, Ser: 0.89 mg/dL (ref 0.61–1.24)
GFR calc Af Amer: >60 mL/min (ref >60)
GFR calc non Af Amer: >60 mL/min (ref >60)
Glucose, Bld: 105 mg/dL — ABNORMAL HIGH (ref 70–99)
Potassium: 4.4 mmol/L (ref 3.5–5.1)
Sodium: 138 mmol/L (ref 135–145)

## 2019-06-08 LAB — ECHOCARDIOGRAM COMPLETE
Height: 66 in
Weight: 2272 oz

## 2019-06-08 LAB — TROPONIN I (HIGH SENSITIVITY)
Troponin I (High Sensitivity): 29 ng/L — ABNORMAL HIGH (ref <18)
Troponin I (High Sensitivity): 28 ng/L — ABNORMAL HIGH (ref <18)

## 2019-06-08 LAB — CREATININE, SERUM
Creatinine, Ser: 0.94 mg/dL (ref 0.61–1.24)
GFR calc Af Amer: >60 mL/min (ref >60)
GFR calc non Af Amer: >60 mL/min (ref >60)

## 2019-06-08 LAB — HEMOGLOBIN A1C
Hgb A1c MFr Bld: 5.8 % — ABNORMAL HIGH (ref 4.8–5.6)
Mean Plasma Glucose: 119.76 mg/dL

## 2019-06-08 LAB — PROTIME-INR
INR: 1.1 (ref 0.8–1.2)
Prothrombin Time: 14.5 seconds (ref 11.4–15.2)

## 2019-06-08 LAB — HIV ANTIBODY (ROUTINE TESTING W REFLEX): HIV Screen 4th Generation wRfx: NONREACTIVE

## 2019-06-08 LAB — SARS CORONAVIRUS 2 (TAT 6-24 HRS): SARS Coronavirus 2: NEGATIVE

## 2019-06-08 MED ORDER — ACETAMINOPHEN 650 MG RE SUPP: 650.0000 mg | Freq: Four times a day (QID) | RECTAL | Status: DC | PRN

## 2019-06-08 MED ORDER — MORPHINE SULFATE (PF) 2 MG/ML IV SOLN: 1.0000 mg | INTRAVENOUS | Status: DC | PRN

## 2019-06-08 MED ORDER — SENNOSIDES-DOCUSATE SODIUM 8.6-50 MG PO TABS: 1.0000 | ORAL_TABLET | Freq: Every evening | ORAL | Status: DC | PRN

## 2019-06-08 MED ORDER — ASPIRIN 81 MG PO CHEW
324.0000 mg | CHEWABLE_TABLET | Freq: Once | ORAL | Status: AC
  Administered 2019-06-08: 324 mg via ORAL
  Filled 2019-06-08: qty 4

## 2019-06-08 MED ORDER — IPRATROPIUM-ALBUTEROL 0.5-2.5 (3) MG/3ML IN SOLN
3.0000 mL | Freq: Four times a day (QID) | RESPIRATORY_TRACT | Status: DC
  Administered 2019-06-08 (×2): 3 mL via RESPIRATORY_TRACT
  Filled 2019-06-08 (×2): qty 3

## 2019-06-08 MED ORDER — HYDRALAZINE HCL 20 MG/ML IJ SOLN: 10.0000 mg | INTRAMUSCULAR | Status: DC | PRN

## 2019-06-08 MED ORDER — METHYLPREDNISOLONE SODIUM SUCC 125 MG IJ SOLR
60.0000 mg | Freq: Once | INTRAMUSCULAR | Status: AC
  Administered 2019-06-08: 60 mg via INTRAVENOUS
  Filled 2019-06-08: qty 2

## 2019-06-08 MED ORDER — IPRATROPIUM-ALBUTEROL 0.5-2.5 (3) MG/3ML IN SOLN
3.0000 mL | Freq: Once | RESPIRATORY_TRACT | Status: AC
  Administered 2019-06-08: 3 mL via RESPIRATORY_TRACT
  Filled 2019-06-08: qty 3

## 2019-06-08 MED ORDER — HEPARIN SODIUM (PORCINE) 5000 UNIT/ML IJ SOLN
5000.0000 [IU] | Freq: Three times a day (TID) | INTRAMUSCULAR | Status: DC
  Administered 2019-06-08 – 2019-06-09 (×3): 5000 [IU] via SUBCUTANEOUS
  Filled 2019-06-08 (×3): qty 1

## 2019-06-08 MED ORDER — POLYETHYLENE GLYCOL 3350 17 G PO PACK: 17.0000 g | PACK | Freq: Every day | ORAL | Status: DC | PRN

## 2019-06-08 MED ORDER — ASPIRIN 81 MG PO CHEW
81.0000 mg | CHEWABLE_TABLET | Freq: Every day | ORAL | Status: DC
  Administered 2019-06-09: 81 mg via ORAL
  Filled 2019-06-08: qty 1

## 2019-06-08 MED ORDER — ACETAMINOPHEN 325 MG PO TABS: 650.0000 mg | ORAL_TABLET | Freq: Four times a day (QID) | ORAL | Status: DC | PRN

## 2019-06-08 NOTE — Progress Notes (Signed)
Pt admitted to the unit, pt denies any chest pain and or discomfort at this time. No open areas noted to skin. Pt oriented to the room and made comfortable. Call bell in reach and no distress noted or reported.

## 2019-06-08 NOTE — ED Notes (Signed)
After speaking with patient again, patient is agreeable to stay overnight for obs at this time. MD made aware.

## 2019-06-08 NOTE — ED Notes (Signed)
Pt transferred to room 258

## 2019-06-08 NOTE — H&P (Signed)
History and Physical    Blake [name] BJY:[id number] DOB: [birth year] DOA: 06/08/2019  PCP: Galen ManilaKennedy, Lauren Renee, NP (Inactive) Patient coming from: Home  Chief Complaint: Chest Pain  HPI: Blake [name] is a 63 y.o. male with medical history significant of CAD s/p CABG in 2012, HTN, COPD, Smoker comes with complaints of chest pain. States he started having substernal crushing chest pain yesterday which was intermittent.  This morning it became more crushing chest pain which was low but more persistent therefore came to the hospital. In the ER his EKG showed mild chronic ST elevation in anterior lead with troponin of 29.  Case was discussed by ER provider with cardiology, Dr. Ladona Ridgelaylor who recommended trending the troponin.  If trends up, start patient on heparin drip otherwise admit the patient for observation.  Dr. Mariah MillingGollan to follow tomorrow.  When I saw the patient his family was at the bedside and denied any chest pain.  Continues to smoke at least 1 pack of cigarettes daily.    Review of Systems: As per HPI otherwise 10 point review of systems negative.  Review of Systems Otherwise negative except as per HPI, including: General: Denies fever, chills, night sweats or unintended weight loss. Resp: Denies cough, wheezing, shortness of breath. Cardiac: Denies palpitations, orthopnea, paroxysmal nocturnal dyspnea. GI: Denies abdominal pain, nausea, vomiting, diarrhea or constipation GU: Denies dysuria, frequency, hesitancy or incontinence MS: Denies muscle aches, joint pain or swelling Neuro: Denies headache, neurologic deficits (focal weakness, numbness, tingling), abnormal gait Psych: Denies anxiety, depression, SI/HI/AVH Skin: Denies new rashes or lesions ID: Denies sick contacts, exotic exposures, travel  Past Medical History:  Diagnosis Date  . Acute headache   . CHF (congestive heart failure) (HCC)   . COPD (chronic obstructive pulmonary disease) (HCC)   . Coronary  artery disease    a. cath 2009 w/ severe 3-vessel dz, rec bypass; b. MI 2012-->4-vessel cabg; c. cath 2016: occluded VG-OM and VG-RPDA, patent LIMA-LAD and patent VG-Diag, 95% stenosis of mid LCx s/p PCI/DES 0% residual stenosis  . Depression   . Hepatitis B   . Hepatitis C   . HFrEF (heart failure with reduced ejection fraction) (HCC)    a. LV gram 2009 with EF 32% with anterolateral and apical HK  . Hyperlipidemia   . Hypertension   . MI (myocardial infarction) (HCC)    x 2  . Tobacco abuse     Past Surgical History:  Procedure Laterality Date  . CARDIAC CATHETERIZATION    . CORONARY ANGIOPLASTY WITH STENT PLACEMENT  2009  . CORONARY ARTERY BYPASS GRAFT  2012   [facility]  . KNEE SURGERY     right knee     SOCIAL HISTORY:  reports that he has been smoking cigarettes. He has a 42.00 pack-year smoking history. He has never used smokeless tobacco. He reports previous drug use. Drug: Heroin. He reports that he does not drink alcohol.  No Known Allergies  FAMILY HISTORY: Family History  Problem Relation Age of Onset  . Hypertension Mother   . Hypertension Father   . Hypertension Sister   . Hypertension Sister      Prior to Admission medications   Medication Sig Start Date End Date Taking? Authorizing Provider  albuterol (VENTOLIN HFA) 108 (90 Base) MCG/ACT inhaler Inhale 2 puffs into the lungs every 6 (six) hours as needed for wheezing or shortness of breath. 01/27/19   Galen ManilaKennedy, Lauren Renee, NP  aspirin 81 MG chewable tablet Chew 1 tablet (81 mg total) by  mouth daily. 09/04/18   Galen Manila, NP  Fluticasone-Salmeterol (ADVAIR) 250-50 MCG/DOSE AEPB Inhale 1 puff into the lungs 2 (two) times daily. 09/04/18   Galen Manila, NP  furosemide (LASIX) 20 MG tablet Take 1 tablet (20 mg total) by mouth daily as needed. 09/04/18   Galen Manila, NP  loratadine (CLARITIN) 10 MG tablet Take 1 tablet (10 mg total) by mouth daily. 09/04/18   Galen Manila, NP   nitroGLYCERIN (NITROSTAT) 0.4 MG SL tablet Place 0.4 mg under the tongue. Reported on 09/29/2015 09/09/14   [provider]  tiotropium (SPIRIVA HANDIHALER) 18 MCG inhalation capsule Place 1 capsule (18 mcg total) into inhaler and inhale daily. 09/04/18   Galen Manila, NP    Physical Exam: Vitals:   06/08/19 0839 06/08/19 0842 06/08/19 0913  BP:  (!) 161/83 (!) 156/87  Pulse: 72  70  Resp: 20  15  Temp: 97.9 F (36.6 C)    TempSrc: Oral    SpO2: 96%  100%  Weight: 64.4 kg    Height: 5\' 6"  (1.676 m)        Constitutional: NAD, calm, comfortable, frail Eyes: PERRL, lids and conjunctivae normal ENMT: Mucous membranes are moist. Posterior pharynx clear of any exudate or lesions.Normal dentition.  Neck: normal, supple, no masses, no thyromegaly Respiratory: clear to auscultation bilaterally, no wheezing, no crackles. Normal respiratory effort. No accessory muscle use.  Cardiovascular: Regular rate and rhythm, no murmurs / rubs / gallops. No extremity edema. 2+ pedal pulses. No carotid bruits.  Abdomen: no tenderness, no masses palpated. No hepatosplenomegaly. Bowel sounds positive.  Musculoskeletal: no clubbing / cyanosis. No joint deformity upper and lower extremities. Good ROM, no contractures. Normal muscle tone.  Skin: no rashes, lesions, ulcers. No induration Neurologic: CN 2-12 grossly intact. Sensation intact, DTR normal. Strength 5/5 in all 4.  Psychiatric: Normal judgment and insight. Alert and oriented x 3. Normal mood.     Labs on Admission: I have personally reviewed following labs and imaging studies  CBC: Recent Labs  Lab 06/08/19 0846  WBC 6.1  HGB 14.0  HCT 42.7  MCV 91.4  PLT 243   Basic Metabolic Panel: Recent Labs  Lab 06/08/19 0846  NA 138  K 4.4  CL 102  CO2 26  GLUCOSE 105*  BUN 20  CREATININE 0.89  CALCIUM 9.2   GFR: Estimated Creatinine Clearance: 74.7 mL/min (by C-G formula based on SCr of 0.89 mg/dL). Liver Function  Tests: No results for input(s): AST, ALT, ALKPHOS, BILITOT, PROT, ALBUMIN in the last 168 hours. No results for input(s): LIPASE, AMYLASE in the last 168 hours. No results for input(s): AMMONIA in the last 168 hours. Coagulation Profile: Recent Labs  Lab 06/08/19 0846  INR 1.1   Cardiac Enzymes: No results for input(s): CKTOTAL, CKMB, CKMBINDEX, TROPONINI in the last 168 hours. BNP (last 3 results) No results for input(s): PROBNP in the last 8760 hours. HbA1C: No results for input(s): HGBA1C in the last 72 hours. CBG: No results for input(s): GLUCAP in the last 168 hours. Lipid Profile: No results for input(s): CHOL, HDL, LDLCALC, TRIG, CHOLHDL, LDLDIRECT in the last 72 hours. Thyroid Function Tests: No results for input(s): TSH, T4TOTAL, FREET4, T3FREE, THYROIDAB in the last 72 hours. Anemia Panel: No results for input(s): VITAMINB12, FOLATE, FERRITIN, TIBC, IRON, RETICCTPCT in the last 72 hours. Urine analysis:    Component Value Date/Time   COLORURINE YELLOW (A) 04/19/2017 1341   APPEARANCEUR CLEAR (A) 04/19/2017 1341  LABSPEC 1.018 04/19/2017 1341   PHURINE 6.0 04/19/2017 1341   GLUCOSEU NEGATIVE 04/19/2017 1341   HGBUR NEGATIVE 04/19/2017 1341   BILIRUBINUR NEGATIVE 04/19/2017 1341   KETONESUR NEGATIVE 04/19/2017 1341   PROTEINUR NEGATIVE 04/19/2017 1341   NITRITE NEGATIVE 04/19/2017 1341   LEUKOCYTESUR NEGATIVE 04/19/2017 1341   Sepsis Labs: !!!!!!!!!!!!!!!!!!!!!!!!!!!!!!!!!!!!!!!!!!!! @LABRCNTIP (procalcitonin:4,lacticidven:4) )No results found for this or any previous visit (from the past 240 hour(s)).   Radiological Exams on Admission: Dg Chest 2 View  Result Date: 06/08/2019 CLINICAL DATA:  Chest pain. History of COPD. EXAM: CHEST - 2 VIEW COMPARISON:  01/01/2016 FINDINGS: Sequelae of CABG are again identified. The cardiomediastinal silhouette is unchanged with normal heart size. The lungs are hyperinflated with chronic peribronchial thickening. No airspace  consolidation, edema, pleural effusion, pneumothorax is identified. No acute osseous abnormality is seen. IMPRESSION: COPD without evidence of active cardiopulmonary disease. Electronically Signed   By: Logan Bores M.D.   On: 06/08/2019 09:23     All images have been reviewed by me personally.  EKG: Independently reviewed. Chronic Anterior lead ST elevation.   Assessment/Plan Principal Problem:   Chest pain due to CAD [facility]) Active Problems:   S/P CABG (coronary artery bypass graft)   Coronary artery disease involving native coronary artery of native heart with other form of angina pectoris (HCC)   Emphysema of lung ([place])   Essential (primary) hypertension   Hypercholesteremia   Chest pain   Chest Pain, concerning for ACS  Hx of CAD s/p CABG 2012 Ischemic Cardiomyopathy.  -admit to the hospital for Observation.  -Aspirin given - EKG shows- Chronic Anterior lead slight ST elevated. Reviewed by Cardiology with the ER provider- Recommend Trend CE; if uptrends Start Heparin drip. Dr Rockey Situ to follow tomorrow.  -Trend CE, check TSH, A1c and lipid panel for risk stratification -Nitro if necessary for chest pain LHC in 2016- Occluded graft OM and PDA.  -Routine cardiac meds to be started Pending meds Rec.   Hx of COPD, stable  -PRN bronchodilators, IS  Essential HTN -pending Preadmission Med rec  HLD -Pending Preadmission Meds rec  GERD -PPI   DVT prophylaxis: SQ Hep Code Status: Full  Family Communication: Family at bedside Disposition Plan:  TBD Consults called: Cardiology, Dr Steva Ready by the ED provider.  Admission status: Observation tele to trend troponins and monitor.    Time Spent: 65 minutes.  >50% of the time was devoted to discussing the patients care, assessment, plan and disposition with other care givers along with counseling the patient about the risks and benefits of treatment.     Arsenio Loader MD [facility]  If 7PM-7AM, please contact  night-coverage   06/08/2019, 10:17 AM

## 2019-06-08 NOTE — ED Provider Notes (Signed)
[facility] Emergency Department Provider Note  ____________________________________________   First MD Initiated Contact with Patient 06/08/19 [id number]     (approximate)  I have reviewed the triage vital signs and the nursing notes.   HISTORY  Chief Complaint Chest Pain    HPI Blake [name] is a 63 y.o. male with COPD, coronary disease status post bypass in 2009 and stent placement in 2012, who presents with chest pain.  Patient states that he woke up and felt fine but then around 6 AM to 7 AM he started to have centralized chest pressure that was constant, better with some nitro he took, nothing made it worse.  He a little bit of shortness of breath associated with it but since that is all resolved.  He states that the nitro has taken his pain down to a 1 out of 10 and he declines further nitro at this time.  He denies any cough or flareup of his COPD.  He denies any coronavirus contacts.  He says this did feel similar to his prior heart attack.  He denies any leg swelling, recent long travel, history of blood clot.           Past Medical History:  Diagnosis Date  . Acute headache   . CHF (congestive heart failure) (HCC)   . COPD (chronic obstructive pulmonary disease) (HCC)   . Coronary artery disease    a. cath 2009 w/ severe 3-vessel dz, rec bypass; b. MI 2012-->4-vessel cabg; c. cath 2016: occluded VG-OM and VG-RPDA, patent LIMA-LAD and patent VG-Diag, 95% stenosis of mid LCx s/p PCI/DES 0% residual stenosis  . Depression   . Hepatitis B   . Hepatitis C   . HFrEF (heart failure with reduced ejection fraction) (HCC)    a. LV gram 2009 with EF 32% with anterolateral and apical HK  . Hyperlipidemia   . Hypertension   . MI (myocardial infarction) (HCC)    x 2  . Tobacco abuse     Patient Active Problem List   Diagnosis Date Noted  . Benign prostatic hyperplasia with urinary obstruction 09/29/2015  . Chest pain at rest 09/29/2015  . Cough  09/29/2015  . Current tobacco use 09/29/2015  . Clinical depression 09/29/2015  . Essential (primary) hypertension 09/29/2015  . Cephalalgia 09/29/2015  . Heart disease 09/29/2015  . Hypercholesteremia 09/29/2015  . 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 H/O acute myocardial infarction 09/29/2015  . Alveolar aeration decreased 09/29/2015  . Special screening for malignant neoplasms, colon 09/29/2015  . Other chest pain 09/16/2014  . SOB (shortness of breath) 09/16/2014  . Hepatitis 09/16/2014  . S/P CABG (coronary artery bypass graft) 09/16/2014  . Coronary artery disease involving native coronary artery of native heart with other form of angina pectoris (HCC) 09/16/2014  . Shortness of breath 09/16/2014  . Smoker 09/16/2014  . Emphysema of lung (HCC) 09/16/2014  . Chronic systolic CHF (congestive heart failure) (HCC) 09/16/2014    Past Surgical History:  Procedure Laterality Date  . CARDIAC CATHETERIZATION    . CORONARY ANGIOPLASTY WITH STENT PLACEMENT  2009  . CORONARY ARTERY BYPASS GRAFT  2012   [facility]  . KNEE SURGERY     right knee     Prior to Admission medications   Medication Sig Start Date End Date Taking? Authorizing Provider  albuterol (VENTOLIN HFA) 108 (90 Base) MCG/ACT inhaler Inhale 2 puffs into the lungs every 6 (six) hours as needed for wheezing or shortness of breath. 01/27/19  Galen Manila, NP  aspirin 81 MG chewable tablet Chew 1 tablet (81 mg total) by mouth daily. 09/04/18   Galen Manila, NP  Fluticasone-Salmeterol (ADVAIR) 250-50 MCG/DOSE AEPB Inhale 1 puff into the lungs 2 (two) times daily. 09/04/18   Galen Manila, NP  furosemide (LASIX) 20 MG tablet Take 1 tablet (20 mg total) by mouth daily as needed. 09/04/18   Galen Manila, NP  loratadine (CLARITIN) 10 MG tablet Take 1 tablet (10 mg total) by mouth daily. 09/04/18   Galen Manila, NP  nitroGLYCERIN (NITROSTAT) 0.4 MG SL tablet Place 0.4 mg under the tongue.  Reported on 09/29/2015 09/09/14   [provider]  tiotropium (SPIRIVA HANDIHALER) 18 MCG inhalation capsule Place 1 capsule (18 mcg total) into inhaler and inhale daily. 09/04/18   Galen Manila, NP    Allergies Patient has no known allergies.  Family History  Problem Relation Age of Onset  . Hypertension Mother   . Hypertension Father   . Hypertension Sister   . Hypertension Sister     Social History Social History   Tobacco Use  . Smoking status: Current Every Day Smoker    Packs/day: 1.00    Years: 42.00    Pack years: 42.00    Types: Cigarettes  . Smokeless tobacco: Never Used  Substance Use Topics  . Alcohol use: No  . 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 ENT: No sore throat. Cardiovascular: Positive chest pain Respiratory: Mild shortness of breath. Gastrointestinal: No abdominal pain.  No nausea, no vomiting.  No diarrhea.  No constipation. Genitourinary: Negative for dysuria. Musculoskeletal: Negative for back pain. Skin: Negative for rash. Neurological: Negative for headaches, focal weakness or numbness. All other ROS negative ____________________________________________   PHYSICAL EXAM:  VITAL SIGNS: ED Triage Vitals  Enc Vitals Group     BP 06/08/19 0842 (!) 161/83     Pulse Rate 06/08/19 0839 72     Resp 06/08/19 0839 20     Temp 06/08/19 0839 97.9 F (36.6 C)     Temp Source 06/08/19 0839 Oral     SpO2 06/08/19 0839 96 %     Weight 06/08/19 0839 142 lb (64.4 kg)     Height 06/08/19 0839  (1.676 m)     Head Circumference --      Peak Flow --      Pain Score 06/08/19 0839 1     Pain Loc --      Pain Edu? --      Excl. in GC? --     Constitutional: Alert and oriented. Well appearing and in no acute distress. Eyes: Conjunctivae are normal. EOMI. Head: Atraumatic. Nose: No congestion/rhinnorhea. Mouth/Throat: Mucous membranes are  moist.   Neck: No stridor. Trachea Midline. FROM Cardiovascular: Normal rate, regular rhythm. Grossly normal heart sounds.  Good peripheral circulation.  No chest wall tenderness. Respiratory: Normal respiratory effort.  No retractions. Lungs very mild occasional expiratory wheezing. Gastrointestinal: Soft and nontender. No distention. No abdominal bruits.  Musculoskeletal: No lower extremity tenderness nor edema.  No joint effusions. Neurologic:  Normal speech and language. No gross focal neurologic deficits are appreciated.  Skin:  Skin is warm, dry and intact. No rash noted. Psychiatric: Mood and affect are normal. Speech and behavior are normal. GU: Deferred   ____________________________________________   LABS (all labs ordered are listed, but only abnormal  results are displayed)  Labs Reviewed  BASIC METABOLIC PANEL - Abnormal; Notable for the following components:      Result Value   Glucose, Bld 105 (*)    All other components within normal limits  TROPONIN I (HIGH SENSITIVITY) - Abnormal; Notable for the following components:   Troponin I (High Sensitivity) 29 (*)    All other components within normal limits  SARS CORONAVIRUS 2 (TAT 6-24 HRS)  CBC  PROTIME-INR   ____________________________________________   ED ECG REPORT I, Concha Se, the attending physician, personally viewed and interpreted this ECG.  EKG is normal sinus rate of 71, does have some ST elevation in V1, V2, V3 with T wave inversions in 1, aVL, V4 through V6.  However I compared to EKG 08/2015 when he came in for a COPD flare and his EKG looks very similar. ____________________________________________  RADIOLOGY I, Concha Se, personally viewed and evaluated these images (plain radiographs) as part of my medical decision making, as well as reviewing the written report by the radiologist.  ED MD interpretation: Evidence of his COPD but no evidence of pneumonia  Official radiology report(s): Dg  Chest 2 View  Result Date: 06/08/2019 CLINICAL DATA:  Chest pain. History of COPD. EXAM: CHEST - 2 VIEW COMPARISON:  01/01/2016 FINDINGS: Sequelae of CABG are again identified. The cardiomediastinal silhouette is unchanged with normal heart size. The lungs are hyperinflated with chronic peribronchial thickening. No airspace consolidation, edema, pleural effusion, pneumothorax is identified. No acute osseous abnormality is seen. IMPRESSION: COPD without evidence of active cardiopulmonary disease. Electronically Signed   By: Sebastian Ache M.D.   On: 06/08/2019 09:23    ____________________________________________   PROCEDURES  Procedure(s) performed (including Critical Care):  Procedures   ____________________________________________   INITIAL IMPRESSION / ASSESSMENT AND PLAN / ED COURSE   Alyaan Knutson was evaluated in Emergency Department on 06/08/2019 for the symptoms described in the history of present illness. He was evaluated in the context of the global COVID-19 pandemic, which necessitated consideration that the patient might be at risk for infection with the SARS-CoV-2 virus that causes COVID-19. Institutional protocols and algorithms that pertain to the evaluation of patients at risk for COVID-19 are in a state of rapid change based on information released by regulatory bodies including the CDC and federal and state organizations. These policies and algorithms were followed during the patient's care in the ED.    Most Likely DDx:  -ACS given risk factors/age -COPD patient states that he does have a little bit of shortness of breath earlier but not now.  Given he does have a little bit of wheezing on exam we will give some breathing treatments and some steroids.   DDx that was also considered d/t potential to cause harm, but was found less likely based on history and physical (as detailed above): -PNA (no fevers, cough but CXR to evaluate) -PNX (reassured with equal b/l  breath sounds, CXR to evaluate) -Symptomatic anemia (will get H&H) -Pulmonary embolism as no sob at rest, not pleuritic in nature, no hypoxia -Aortic Dissection as no tearing pain and no radiation to the mid back, pulses equal -Pericarditis no rub on exam, EKG changes or hx to suggest dx -Tamponade (no notable SOB, tachycardic, hypotensive) -Esophageal rupture (no h/o diffuse vomitting/no crepitus)   Labs are reassuring with normal kidney function.  No white count elevation to suggest infection.  Troponin is 29.  I did get a repeat EKG to ensure there is no evidence of  progression and he continues to have just a minimal ST elevation in V1, V2, V3 with the T wave inversions in V4 V5 V6, 1 and aVL.  Looks similar to his prior EKG upon arrival.  At this time do not think he warrants activation of the Cath Lab however I do feel he needs admission for high risk chest pain and treated his cardiac markers and discussion with cardiology.  We will give patient full dose of aspirin and discuss possible team for admission.  Will d/w Dr. Lovena Le from Cards agree that if EKG looks unchanged from prior to cycle the troponins.  If elevating then can sart on heparin but otherwise hold off now.    Admit.     ____________________________________________   FINAL CLINICAL IMPRESSION(S) / ED DIAGNOSES   Final diagnoses:  Chest pain, unspecified type  Elevated troponin  Simple chronic bronchitis (HCC)     MEDICATIONS GIVEN DURING THIS VISIT:  Medications  heparin injection 5,000 Units (has no administration in time range)  acetaminophen (TYLENOL) tablet 650 mg (has no administration in time range)    Or  acetaminophen (TYLENOL) suppository 650 mg (has no administration in time range)  senna-docusate (Senokot-S) tablet 1 tablet (has no administration in time range)  morphine 2 MG/ML injection 1 mg (has no administration in time range)  Ipratropium-Albuterol (COMBIVENT) respimat 1 puff (has no  administration in time range)  polyethylene glycol (MIRALAX / GLYCOLAX) packet 17 g (has no administration in time range)  hydrALAZINE (APRESOLINE) injection 10 mg (has no administration in time range)  aspirin chewable tablet 324 mg (324 mg Oral Given 06/08/19 1003)  ipratropium-albuterol (DUONEB) 0.5-2.5 (3) MG/3ML nebulizer solution 3 mL (3 mLs Nebulization Given 06/08/19 1006)  methylPREDNISolone sodium succinate (SOLU-MEDROL) 125 mg/2 mL injection 60 mg (60 mg Intravenous Given 06/08/19 1005)     ED Discharge Orders    None       Note:  This document was prepared using Dragon voice recognition software and may include unintentional dictation errors.   Vanessa [facility] Francis, MD 06/08/19 1050

## 2019-06-08 NOTE — Progress Notes (Signed)
*  PRELIMINARY RESULTS* Echocardiogram 2D Echocardiogram has been performed.  Blake [name] Blake [name] Blake [name] 06/08/2019, 10:55 AM

## 2019-06-08 NOTE — ED Notes (Signed)
Pt reports chest pain that began this morning in the middle of his chest. Denies pain radiation to left arm or shoulder. States that he has experienced this chest pain previously when he had a heart attack a few years ago. Denies SOB, cough, and diaphoresis.

## 2019-06-08 NOTE — ED Notes (Signed)
Patient reports he does not want to stay in the hospital. Agreeable to stay for repeat troponin and to discuss further with doctor.

## 2019-06-08 NOTE — ED Triage Notes (Signed)
Patient c/o central sharp chest pain with SOB since this AM. HX stents placed

## 2019-06-08 NOTE — ED Notes (Signed)
This nurse captured second EKG, per MD request.

## 2019-06-09 ENCOUNTER — Encounter
Admission: EM | Disposition: A | Discharge status: Home / Self Care | Payer: Self-pay | Source: Home / Self Care | Attending: Emergency Medicine

## 2019-06-09 ENCOUNTER — Encounter: Payer: Self-pay | Admitting: Cardiovascular Disease

## 2019-06-09 DIAGNOSIS — R7989 Other specified abnormal findings of blood chemistry: Secondary | ICD-10-CM

## 2019-06-09 DIAGNOSIS — I10 Essential (primary) hypertension: Secondary | ICD-10-CM | POA: Diagnosis not present

## 2019-06-09 DIAGNOSIS — I25110 Atherosclerotic heart disease of native coronary artery with unstable angina pectoris: Secondary | ICD-10-CM

## 2019-06-09 DIAGNOSIS — R778 Other specified abnormalities of plasma proteins: Secondary | ICD-10-CM

## 2019-06-09 DIAGNOSIS — I5022 Chronic systolic (congestive) heart failure: Secondary | ICD-10-CM | POA: Diagnosis not present

## 2019-06-09 DIAGNOSIS — Z720 Tobacco use: Secondary | ICD-10-CM

## 2019-06-09 DIAGNOSIS — E785 Hyperlipidemia, unspecified: Secondary | ICD-10-CM

## 2019-06-09 DIAGNOSIS — I25118 Atherosclerotic heart disease of native coronary artery with other forms of angina pectoris: Secondary | ICD-10-CM

## 2019-06-09 DIAGNOSIS — I25119 Atherosclerotic heart disease of native coronary artery with unspecified angina pectoris: Secondary | ICD-10-CM | POA: Diagnosis not present

## 2019-06-09 HISTORY — PX: LEFT HEART CATH AND CORS/GRAFTS ANGIOGRAPHY: CATH118250

## 2019-06-09 LAB — COMPREHENSIVE METABOLIC PANEL
ALT: 11 U/L (ref 0–44)
AST: 18 U/L (ref 15–41)
Albumin: 3.1 g/dL — ABNORMAL LOW (ref 3.5–5.0)
Alkaline Phosphatase: 93 U/L (ref 38–126)
Anion gap: 8 (ref 5–15)
BUN: 24 mg/dL — ABNORMAL HIGH (ref 8–23)
CO2: 23 mmol/L (ref 22–32)
Calcium: 8.7 mg/dL — ABNORMAL LOW (ref 8.9–10.3)
Chloride: 107 mmol/L (ref 98–111)
Creatinine, Ser: 0.85 mg/dL (ref 0.61–1.24)
GFR calc Af Amer: >60 mL/min (ref >60)
GFR calc non Af Amer: >60 mL/min (ref >60)
Glucose, Bld: 104 mg/dL — ABNORMAL HIGH (ref 70–99)
Potassium: 4.1 mmol/L (ref 3.5–5.1)
Sodium: 138 mmol/L (ref 135–145)
Total Bilirubin: 0.8 mg/dL (ref 0.3–1.2)
Total Protein: 7.4 g/dL (ref 6.5–8.1)

## 2019-06-09 LAB — CBC
HCT: 39.4 % (ref 39.0–52.0)
Hemoglobin: 12.8 g/dL — ABNORMAL LOW (ref 13.0–17.0)
MCH: 30.2 pg (ref 26.0–34.0)
MCHC: 32.5 g/dL (ref 30.0–36.0)
MCV: 92.9 fL (ref 80.0–100.0)
Platelets: 228 10*3/uL (ref 150–400)
RBC: 4.24 MIL/uL (ref 4.22–5.81)
RDW: 12.8 % (ref 11.5–15.5)
WBC: 10.6 10*3/uL — ABNORMAL HIGH (ref 4.0–10.5)
nRBC: 0 % (ref 0.0–0.2)

## 2019-06-09 LAB — PROTIME-INR
INR: 1.2 (ref 0.8–1.2)
Prothrombin Time: 14.8 seconds (ref 11.4–15.2)

## 2019-06-09 LAB — TROPONIN I (HIGH SENSITIVITY): Troponin I (High Sensitivity): 21 ng/L — ABNORMAL HIGH (ref <18)

## 2019-06-09 LAB — APTT: aPTT: 31 seconds (ref 24–36)

## 2019-06-09 LAB — GLUCOSE, CAPILLARY: Glucose-Capillary: 167 mg/dL — ABNORMAL HIGH (ref 70–99)

## 2019-06-09 SURGERY — LEFT HEART CATH AND CORS/GRAFTS ANGIOGRAPHY
Anesthesia: Moderate Sedation

## 2019-06-09 MED ORDER — SODIUM CHLORIDE 0.9 % IV SOLN: INTRAVENOUS | Status: DC

## 2019-06-09 MED ORDER — FENTANYL CITRATE (PF) 100 MCG/2ML IJ SOLN
INTRAMUSCULAR | Status: AC
  Filled 2019-06-09: qty 2

## 2019-06-09 MED ORDER — HEPARIN (PORCINE) IN NACL 1000-0.9 UT/500ML-% IV SOLN
INTRAVENOUS | Status: AC
  Filled 2019-06-09: qty 1000

## 2019-06-09 MED ORDER — METOPROLOL SUCCINATE ER 25 MG PO TB24: 25.0000 mg | ORAL_TABLET | Freq: Every day | ORAL | 0 refills | Status: DC

## 2019-06-09 MED ORDER — ATORVASTATIN CALCIUM 40 MG PO TABS: 40.0000 mg | ORAL_TABLET | Freq: Every day | ORAL | 0 refills | Status: DC

## 2019-06-09 MED ORDER — VERAPAMIL HCL 2.5 MG/ML IV SOLN
INTRAVENOUS | Status: DC | PRN
  Administered 2019-06-09: 2.5 mg via INTRAVENOUS

## 2019-06-09 MED ORDER — MIDAZOLAM HCL 2 MG/2ML IJ SOLN
INTRAMUSCULAR | Status: DC | PRN
  Administered 2019-06-09: 1 mg via INTRAVENOUS

## 2019-06-09 MED ORDER — IOHEXOL 300 MG/ML  SOLN
INTRAMUSCULAR | Status: DC | PRN
  Administered 2019-06-09: 80 mL

## 2019-06-09 MED ORDER — SODIUM CHLORIDE 0.9 % IV SOLN: 250.0000 mL | INTRAVENOUS | Status: DC | PRN

## 2019-06-09 MED ORDER — METOPROLOL SUCCINATE ER 25 MG PO TB24
25.0000 mg | ORAL_TABLET | Freq: Every day | ORAL | Status: DC
  Administered 2019-06-09: 25 mg via ORAL
  Filled 2019-06-09: qty 1

## 2019-06-09 MED ORDER — IPRATROPIUM-ALBUTEROL 0.5-2.5 (3) MG/3ML IN SOLN: 3.0000 mL | Freq: Four times a day (QID) | RESPIRATORY_TRACT | Status: DC | PRN

## 2019-06-09 MED ORDER — HEPARIN SODIUM (PORCINE) 1000 UNIT/ML IJ SOLN
INTRAMUSCULAR | Status: AC
  Filled 2019-06-09: qty 1

## 2019-06-09 MED ORDER — ATORVASTATIN CALCIUM 20 MG PO TABS: 40.0000 mg | ORAL_TABLET | Freq: Every day | ORAL | Status: DC

## 2019-06-09 MED ORDER — VERAPAMIL HCL 2.5 MG/ML IV SOLN
INTRAVENOUS | Status: AC
  Filled 2019-06-09: qty 2

## 2019-06-09 MED ORDER — SODIUM CHLORIDE 0.9% FLUSH: 3.0000 mL | Freq: Two times a day (BID) | INTRAVENOUS | Status: DC

## 2019-06-09 MED ORDER — FENTANYL CITRATE (PF) 100 MCG/2ML IJ SOLN
INTRAMUSCULAR | Status: DC | PRN
  Administered 2019-06-09: 50 ug via INTRAVENOUS

## 2019-06-09 MED ORDER — SODIUM CHLORIDE 0.9% FLUSH: 3.0000 mL | INTRAVENOUS | Status: DC | PRN

## 2019-06-09 MED ORDER — IPRATROPIUM-ALBUTEROL 0.5-2.5 (3) MG/3ML IN SOLN
RESPIRATORY_TRACT | Status: AC
  Administered 2019-06-09: 3 mL
  Filled 2019-06-09: qty 3

## 2019-06-09 MED ORDER — ONDANSETRON HCL 4 MG/2ML IJ SOLN: 4.0000 mg | Freq: Four times a day (QID) | INTRAMUSCULAR | Status: DC | PRN

## 2019-06-09 MED ORDER — IPRATROPIUM-ALBUTEROL 0.5-2.5 (3) MG/3ML IN SOLN
3.0000 mL | Freq: Three times a day (TID) | RESPIRATORY_TRACT | Status: DC
  Administered 2019-06-09: 3 mL via RESPIRATORY_TRACT
  Filled 2019-06-09: qty 3

## 2019-06-09 MED ORDER — HEPARIN (PORCINE) IN NACL 1000-0.9 UT/500ML-% IV SOLN
INTRAVENOUS | Status: DC | PRN
  Administered 2019-06-09: 500 mL

## 2019-06-09 MED ORDER — MIDAZOLAM HCL 2 MG/2ML IJ SOLN
INTRAMUSCULAR | Status: AC
  Filled 2019-06-09: qty 2

## 2019-06-09 MED ORDER — HEPARIN SODIUM (PORCINE) 1000 UNIT/ML IJ SOLN
INTRAMUSCULAR | Status: DC | PRN
  Administered 2019-06-09: 4000 [IU] via INTRAVENOUS

## 2019-06-09 MED ORDER — ASPIRIN 81 MG PO CHEW: 81.0000 mg | CHEWABLE_TABLET | ORAL | Status: DC

## 2019-06-09 SURGICAL SUPPLY — 7 items
CATH 5F 110X4 TIG (CATHETERS) ×3 IMPLANT
CATH INFINITI 5 FR IM (CATHETERS) ×3 IMPLANT
DEVICE RAD TR BAND REGULAR (VASCULAR PRODUCTS) ×3 IMPLANT
GLIDESHEATH SLEND SS 6F .021 (SHEATH) ×3 IMPLANT
KIT MANI 3VAL PERCEP (MISCELLANEOUS) ×3 IMPLANT
PACK CARDIAC CATH (CUSTOM PROCEDURE TRAY) ×3 IMPLANT
WIRE ROSEN-J .035X260CM (WIRE) ×3 IMPLANT

## 2019-06-09 NOTE — Discharge Summary (Signed)
Blake [name] WCH:[id number] DOB: [birth year] DOA: 06/08/2019  PCP: Mikey College, NP (Inactive)  Admit date: 06/08/2019 Discharge date: 06/09/2019  Admitted From: Home Disposition: Home  Recommendations for Outpatient Follow-up:  1. Follow up with PCP in 1 to 3 days 2. Please obtain BMP/CBC in one week 3. Please follow up on the following pending results: None 4. Follow-up with primary cardiologist in 1 to 2 weeks  Home Health: None   Discharge Condition:Stable CODE STATUS: Full Diet recommendation: Heart Healthy / Carb Modified / Regular / Dysphagia  Brief/Interim Summary: Blake [name] is a 63 y.o. male with medical history significant of CAD s/p CABG in 2012, HTN, COPD, Smoker comes with complaints of chest pain. States he started having substernal crushing chest pain yesterday which was intermittent.  This morning it became more crushing chest pain which was low but more persistent therefore came to the hospital. In the ER his EKG showed mild chronic ST elevation in anterior lead with troponin of 29.  Cardiology was consulted, patient was taken for left heart cath today and there was no significant change from his 2016 cath.  They recommended discharging patient home from cardiac standpoint on aspirin, beta-blockers, statin.  Discharge Diagnoses:  Principal Problem:   Chest pain due to CAD [facility]) Active Problems:   S/P CABG (coronary artery bypass graft)   Coronary artery disease involving native coronary artery of native heart with other form of angina pectoris ([place])   Emphysema of lung ([place])   Essential (primary) hypertension   Hypercholesteremia   Chest pain    Discharge Instructions  Discharge Instructions    Call MD for:  difficulty breathing, headache or visual disturbances   Complete by: As directed    Diet - low sodium heart healthy   Complete by: As directed    Discharge instructions   Complete by: As directed    Follow up with your primary  cardiologist in 1-2 weeks. Follow-up with primary care in 1 to 3 days and have BMP and CBC checked   Increase activity slowly   Complete by: As directed      Allergies as of 06/09/2019   No Known Allergies     Medication List    TAKE these medications   albuterol 108 (90 Base) MCG/ACT inhaler Commonly known as: VENTOLIN HFA Inhale 2 puffs into the lungs every 6 (six) hours as needed for wheezing or shortness of breath.   aspirin 81 MG chewable tablet Chew 1 tablet (81 mg total) by mouth daily.   atorvastatin 40 MG tablet Commonly known as: LIPITOR Take 1 tablet (40 mg total) by mouth daily at 6 PM.   Fluticasone-Salmeterol 250-50 MCG/DOSE Aepb Commonly known as: ADVAIR Inhale 1 puff into the lungs 2 (two) times daily.   furosemide 20 MG tablet Commonly known as: LASIX Take 1 tablet (20 mg total) by mouth daily as needed.   loratadine 10 MG tablet Commonly known as: CLARITIN Take 1 tablet (10 mg total) by mouth daily.   metoprolol succinate 25 MG 24 hr tablet Commonly known as: TOPROL-XL Take 1 tablet (25 mg total) by mouth daily.   nitroGLYCERIN 0.4 MG SL tablet Commonly known as: NITROSTAT Place 0.4 mg under the tongue. Reported on 09/29/2015   tiotropium 18 MCG inhalation capsule Commonly known as: Spiriva HandiHaler Place 1 capsule (18 mcg total) into inhaler and inhale daily.       No Known Allergies  Consultations:  Cardiology   Procedures/Studies: Dg Chest 2 View  Result  Date: 06/08/2019 CLINICAL DATA:  Chest pain. History of COPD. EXAM: CHEST - 2 VIEW COMPARISON:  01/01/2016 FINDINGS: Sequelae of CABG are again identified. The cardiomediastinal silhouette is unchanged with normal heart size. The lungs are hyperinflated with chronic peribronchial thickening. No airspace consolidation, edema, pleural effusion, pneumothorax is identified. No acute osseous abnormality is seen. IMPRESSION: COPD without evidence of active cardiopulmonary disease.  Electronically Signed   By: Sebastian AcheAllen  Grady M.D.   On: 06/08/2019 09:23       Subjective: Patient seen and examined earlier this morning.  Denies any chest pain, shortness of breath or any other symptoms.  Discharge Exam: Vitals:   06/09/19 1230 06/09/19 1303  BP: (!) 143/97 113/75  Pulse: 64 70  Resp: 19 14  Temp:    SpO2: 92% 97%   Vitals:   06/09/19 1157 06/09/19 1215 06/09/19 1230 06/09/19 1303  BP: 130/85 139/89 (!) 143/97 113/75  Pulse: 75 77 64 70  Resp: 19 16 19 14   Temp:      TempSrc:      SpO2: 95% 97% 92% 97%  Weight:      Height:        General: Pt is alert, awake, not in acute distress Cardiovascular: RRR, S1/S2 +, no rubs, no gallops Respiratory: CTA bilaterally, no wheezing, no rhonchi Abdominal: Soft, NT, ND, bowel sounds + Extremities: no edema, no cyanosis    The results of significant diagnostics from this hospitalization (including imaging, microbiology, ancillary and laboratory) are listed below for reference.     Microbiology: Recent Results (from the past 240 hour(s))  SARS CORONAVIRUS 2 (TAT 6-24 HRS) Nasopharyngeal Nasopharyngeal Swab     Status: None   Collection Time: 06/08/19 10:07 AM   Specimen: Nasopharyngeal Swab  Result Value Ref Range Status   SARS Coronavirus 2 NEGATIVE NEGATIVE Final    Comment: (NOTE) SARS-CoV-2 target nucleic acids are NOT DETECTED. The SARS-CoV-2 RNA is generally detectable in upper and lower respiratory specimens during the acute phase of infection. Negative results do not preclude SARS-CoV-2 infection, do not rule out co-infections with other pathogens, and should not be used as the sole basis for treatment or other patient management decisions. Negative results must be combined with clinical observations, patient history, and epidemiological information. The expected result is Negative. Fact Sheet for Patients: HairSlick.nohttps://www.fda.gov/media/138098/download Fact Sheet for Healthcare  Providers: quierodirigir.comhttps://www.fda.gov/media/138095/download This test is not yet approved or cleared by the [place] FDA and  has been authorized for detection and/or diagnosis of SARS-CoV-2 by FDA under an Emergency Use Authorization (EUA). This EUA will remain  in effect (meaning this test can be used) for the duration of the COVID-19 declaration under Section 56 4(b)(1) of the Act, 21 U.S.C. section 360bbb-3(b)(1), unless the authorization is terminated or revoked sooner. Performed at [facility], [address] [place], [place] [address]      Labs: BNP (last 3 results) No results for input(s): BNP in the last 8760 hours. Basic Metabolic Panel: Recent Labs  Lab 06/08/19 0846 06/08/19 1256 06/09/19 0456  NA 138  --  138  K 4.4  --  4.1  CL 102  --  107  CO2 26  --  23  GLUCOSE 105*  --  104*  BUN 20  --  24*  CREATININE 0.89 0.94 0.85  CALCIUM 9.2  --  8.7*   Liver Function Tests: Recent Labs  Lab 06/09/19 0456  AST 18  ALT 11  ALKPHOS 93  BILITOT 0.8  PROT 7.4  ALBUMIN 3.1*   No results for input(s): LIPASE, AMYLASE in the last 168 hours. No results for input(s): AMMONIA in the last 168 hours. CBC: Recent Labs  Lab 06/08/19 0846 06/08/19 1256 06/09/19 0456  WBC 6.1 4.4 10.6*  HGB 14.0 13.9 12.8*  HCT 42.7 41.8 39.4  MCV 91.4 90.5 92.9  PLT 243 232 228   Cardiac Enzymes: No results for input(s): CKTOTAL, CKMB, CKMBINDEX, TROPONINI in the last 168 hours. BNP: Invalid input(s): POCBNP CBG: Recent Labs  Lab 06/09/19 0812  GLUCAP 167*   D-Dimer No results for input(s): DDIMER in the last 72 hours. Hgb A1c Recent Labs    06/08/19 1256  HGBA1C 5.8*   Lipid Profile Recent Labs    06/08/19 1256  CHOL 144  HDL 38*  LDLCALC 96  TRIG 48  CHOLHDL 3.8   Thyroid function studies Recent Labs    06/08/19 1256  TSH 0.729   Anemia work up No results for input(s): VITAMINB12, FOLATE, FERRITIN, TIBC, IRON, RETICCTPCT in the last 72  hours. Urinalysis    Component Value Date/Time   COLORURINE YELLOW (A) 04/19/2017 1341   APPEARANCEUR CLEAR (A) 04/19/2017 1341   LABSPEC 1.018 04/19/2017 1341   PHURINE 6.0 04/19/2017 1341   GLUCOSEU NEGATIVE 04/19/2017 1341   HGBUR NEGATIVE 04/19/2017 1341   BILIRUBINUR NEGATIVE 04/19/2017 1341   KETONESUR NEGATIVE 04/19/2017 1341   PROTEINUR NEGATIVE 04/19/2017 1341   NITRITE NEGATIVE 04/19/2017 1341   LEUKOCYTESUR NEGATIVE 04/19/2017 1341   Sepsis Labs Invalid input(s): PROCALCITONIN,  WBC,  LACTICIDVEN Microbiology Recent Results (from the past 240 hour(s))  SARS CORONAVIRUS 2 (TAT 6-24 HRS) Nasopharyngeal Nasopharyngeal Swab     Status: None   Collection Time: 06/08/19 10:07 AM   Specimen: Nasopharyngeal Swab  Result Value Ref Range Status   SARS Coronavirus 2 NEGATIVE NEGATIVE Final    Comment: (NOTE) SARS-CoV-2 target nucleic acids are NOT DETECTED. The SARS-CoV-2 RNA is generally detectable in upper and lower respiratory specimens during the acute phase of infection. Negative results do not preclude SARS-CoV-2 infection, do not rule out co-infections with other pathogens, and should not be used as the sole basis for treatment or other patient management decisions. Negative results must be combined with clinical observations, patient history, and epidemiological information. The expected result is Negative. Fact Sheet for Patients: HairSlick.no Fact Sheet for Healthcare Providers: quierodirigir.com This test is not yet approved or cleared by the [place] FDA and  has been authorized for detection and/or diagnosis of SARS-CoV-2 by FDA under an Emergency Use Authorization (EUA). This EUA will remain  in effect (meaning this test can be used) for the duration of the COVID-19 declaration under Section 56 4(b)(1) of the Act, 21 U.S.C. section 360bbb-3(b)(1), unless the authorization is terminated or revoked  sooner. Performed at [facility], [address], [place], [place] [address]      Time coordinating discharge: Over 30 minutes  SIGNED:   Lynn Ito, MD  [facility] 06/09/2019, 2:02 PM Pager   If 7PM-7AM, please contact night-coverage [email or web address] Password [email or web address]

## 2019-06-09 NOTE — Progress Notes (Signed)
Pt reports he took the IV out yesterday because it was itching him

## 2019-06-09 NOTE — Consult Note (Signed)
Cardiology Consultation:   Patient ID: Blake [name] MRN: [id number]; DOB: 07-Oct-1955  Admit date: 06/08/2019 Date of Consult: 06/09/2019  Primary Care Provider: Galen [name], Blake Renee, NP (Inactive) Primary Cardiologist: Blake Nordmannimothy Gollan, MD  Primary Electrophysiologist:  None    Patient Profile:   Blake [name] is a 63 y.o. male with a hx of  CAD s/p CABG ([facility], 2012) with repeat cath 2016 as outlined below, HFrEF (EF 35-40%, 05/2019), mild to moderate MR, HTN, HLD, hepatitis B/C, COPD, schizophrenia, and current smoker (1 pk daily), and who is being seen today for the evaluation of chest pain with elevated troponin at the request of Dr. Marylu LundAmery.  History of Present Illness:   Blake [name] is a 63 yo male with PMH as above.  He underwent 2009 cardiac catheterization that showed EF 30%, severe anterolateral and severe apical hypokinesis, and three-vessel coronary artery disease with recommendation for bypass surgery.  [facility] From 2013-May 2015, he was documented as in prison and without his cardiac medications. In 2016, he developed worsening shortness of breath with repeat cardiac catheterization showing DES to to mid circumflex with 95% stenosis, OM 295% stenosis, PICA 100% stenosis, SVG to OM 200% stenosis, and SVG to RPDA 100% stenosis.  He was last seen in 2017 by Dr. Mariah MillingGollan, at which time he noted intermittent chest pain for which he occasionally took nitroglycerin.  He continued to smoke one pack daily.  EKG at that time showed NSR, 69 bpm, diffuse T wave abnormality.  He was then lost to follow-up.   Of note, patient reported medication compliance since that time and at the time of consultation today; however, it is unclear where he has been refilling his cardiac medications given his poor outpatient follow-up. He continues to smoke 1 pk daily but indicated a desire to quit smoking. He denied current alcohol and drug use.   On 06/08/2019, the  patient reportedly woke up and was watching the news around 7AM when he felt acute onset of right to central sided chest pain.  He described the chest pain as non-exertional sharp, non-pleuritic, and non-radiating.  He did note that the chest pain improved with certain positions but could not elaborate further as to which positions.  He reported that the severe chest pain felt exactly the same as before his prior cardiac catheterizations and lasted for approximately 30 minutes before improving to only a mild chest pain and without any clear alleviating factors.  He did not use SL nitro for his CP.  He denied any associated nausea, emesis, presyncope, or syncope.  He did note that the chest pain "took his breath away," and therefore felt SOB/DOE. No diaphoresis. No recent LEE or orthopnea. No feelings of rapid HR or palpitations. No recent melena, BRBPR, hematochezia, hematuria, or hemoptysis.   By the time he arrived at [facility] emergency department, he stated the chest pain had completely dissipated; however, since he had not had any CP like this since 2017, he decided to remain in the ED for further evaluation.  In the ED, vitals were significant for BP 161/83, HR 72, RR 20, T 97.9, 96% ORA.  Labs showed sodium 138, K 4.4, Cr 0.89, BUN 20, HS Tn 29 then down-trending, WBC 6.1, Hgb 14.0, Hct 42.7, glucose 105. CXR showed COPD without active cardiopulmonary dz. Cardiology was consulted for further management and workup.  At the time of cardiac consultation, he denied any current CP or SOB.  Heart Pathway Score:  Past Medical History:  Diagnosis Date   Acute headache    CHF (congestive heart failure) (HCC)    COPD (chronic obstructive pulmonary disease) (HCC)    Coronary artery disease    a. cath 2009 w/ severe 3-vessel dz, rec bypass; b. MI 2012-->4-vessel cabg; c. cath 2016: occluded VG-OM and VG-RPDA, patent LIMA-LAD and patent VG-Diag, 95% stenosis of mid LCx s/p PCI/DES 0% residual stenosis    Depression    Hepatitis B    Hepatitis C    HFrEF (heart failure with reduced ejection fraction) (HCC)    a. LV gram 2009 with EF 32% with anterolateral and apical HK   Hyperlipidemia    Hypertension    MI (myocardial infarction) (HCC)    x 2   Tobacco abuse     Past Surgical History:  Procedure Laterality Date   CARDIAC CATHETERIZATION     CORONARY ANGIOPLASTY WITH STENT PLACEMENT  2009   CORONARY ARTERY BYPASS GRAFT  2012   [facility]   KNEE SURGERY     right knee      Home Medications:  Prior to Admission medications   Medication Sig Start Date End Date Taking? Authorizing Provider  albuterol (VENTOLIN HFA) 108 (90 Base) MCG/ACT inhaler Inhale 2 puffs into the lungs every 6 (six) hours as needed for wheezing or shortness of breath. 01/27/19  Yes Blake Manila, NP  aspirin 81 MG chewable tablet Chew 1 tablet (81 mg total) by mouth daily. 09/04/18   Blake Manila, NP  Fluticasone-Salmeterol (ADVAIR) 250-50 MCG/DOSE AEPB Inhale 1 puff into the lungs 2 (two) times daily. 09/04/18   Blake Manila, NP  furosemide (LASIX) 20 MG tablet Take 1 tablet (20 mg total) by mouth daily as needed. 09/04/18   Blake Manila, NP  loratadine (CLARITIN) 10 MG tablet Take 1 tablet (10 mg total) by mouth daily. 09/04/18   Blake Manila, NP  nitroGLYCERIN (NITROSTAT) 0.4 MG SL tablet Place 0.4 mg under the tongue. Reported on 09/29/2015 09/09/14   [provider]  tiotropium (SPIRIVA HANDIHALER) 18 MCG inhalation capsule Place 1 capsule (18 mcg total) into inhaler and inhale daily. 09/04/18   Blake Manila, NP    Inpatient Medications: Scheduled Meds:  aspirin  81 mg Oral Daily   heparin  5,000 Units Subcutaneous Q8H   ipratropium-albuterol  3 mL Inhalation TID   sodium chloride flush  3 mL Intravenous Q12H   Continuous Infusions:  PRN Meds: acetaminophen **OR** acetaminophen, hydrALAZINE, ipratropium-albuterol, morphine injection,  polyethylene glycol, senna-docusate  Allergies:   No Known Allergies  Social History:   Social History   Socioeconomic History   Marital status: Single    Spouse name: Not on file   Number of children: Not on file   Years of education: Not on file   Highest education level: Not on file  Occupational History   Not on file  Social Needs   Financial resource strain: Not on file   Food insecurity    Worry: Not on file    Inability: Not on file   Transportation needs    Medical: Not on file    Non-medical: Not on file  Tobacco Use   Smoking status: Current Every Day Smoker    Packs/day: 1.00    Years: 42.00    Pack years: 42.00    Types: Cigarettes   Smokeless tobacco: Never Used  Substance and Sexual Activity   Alcohol use: No   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   Sexual activity: Not on file  Lifestyle   Physical activity    Days per week: Not on file    Minutes per session: Not on file   Stress: Not on file  Relationships   Social connections    Talks on phone: Not on file    Gets together: Not on file    Attends religious service: Not on file    Active member of club or organization: Not on file    Attends meetings of clubs or organizations: Not on file    Relationship status: Not on file   Intimate partner violence    Fear of current or ex partner: Not on file    Emotionally abused: Not on file    Physically abused: Not on file    Forced sexual activity: Not on file  Other Topics Concern   Not on file  Social History Narrative   Not on file    Family History:    Family History  Problem Relation Age of Onset   Hypertension Mother    Hypertension Father    Hypertension Sister    Hypertension Sister      ROS:  Please see the history of present illness.  Review of Systems  Constitutional: Negative for diaphoresis.  Respiratory: Positive for shortness of breath. Negative for cough, hemoptysis and sputum production.     Cardiovascular: Positive for chest pain. Negative for palpitations, orthopnea, leg swelling and PND.  Gastrointestinal: Negative for blood in stool, diarrhea, melena, nausea and vomiting.  Genitourinary: Negative for hematuria.  Musculoskeletal: Negative for falls.  Neurological: Negative for dizziness.    All other ROS reviewed and negative.     Physical Exam/Data:   Vitals:   06/08/19 2004 06/09/19 0349 06/09/19 0515 06/09/19 0813  BP:  116/64  136/82  Pulse:  67  65  Resp:  16  19  Temp:  98.6 F (37 C)  98 F (36.7 C)  TempSrc:  Oral    SpO2: 93% 95%  100%  Weight:   69.3 kg   Height:        Intake/Output Summary (Last 24 hours) at 06/09/2019 0946 Last data filed at 06/08/2019 1837 Gross per 24 hour  Intake 240 ml  Output 200 ml  Net 40 ml   Last 3 Weights 06/09/2019 06/08/2019 09/04/2018  Weight (lbs) 152 lb 12.5 oz 142 lb 162 lb  Weight (kg) 69.3 kg 64.411 kg 73.483 kg     Body mass index is 24.66 kg/m.  General:  Well nourished, well developed, in no acute distress. Joined by his wife. HEENT: normal Neck: no JVD Vascular: No carotid bruits; radial pulses 2+ bilaterally Cardiac:  normal S1, S2; RRR with extrasystole appreciated; 1/6 systolic murmur Lungs:  Reduced breath sounds bilaterally but otherwise clear to auscultation, no wheezing, rhonchi or rales  Abd: soft, nontender, no hepatomegaly  Ext: no b/l LEE  Musculoskeletal:  No deformities, BUE and BLE strength normal and equal Skin: warm and dry  Neuro:  No focal abnormalities noted Psych:  Normal affect   EKG:  The EKG was personally reviewed and demonstrates:  NSR with PACs and first-degree block, PRi 216 ms, IVCD, LAD with TWI in lateral leads (present on 2017 EKG), ST changes noted in septal leads and more pronounced with subsequent EKGs with evidence of likely previous septal infarct, LVH / repolarization abnormalities  Telemetry:  Telemetry was personally reviewed and demonstrates:  NSR 60s-80s  with PACs / PVCs  Relevant CV Studies: Echo  06/08/2019  1. Left ventricular ejection fraction, by visual estimation, is 35 to 40%. The left ventricle has moderately decreased function. There is no left ventricular hypertrophy.  2. The left ventricle demonstrates global hypokinesis with severe hypokinesis of the anterior, anteroseptal and anterolateral walls.  3. Global right ventricle has normal systolic function.The right ventricular size is normal. No increase in right ventricular wall thickness.  4. Left atrial size was normal.  5. Mild to moderate mitral valve regurgitation.  6. Tricuspid valve regurgitation is mild.  7. Aortic valve regurgitation is mild to moderate.  8. The aortic valve is normal in structure. Aortic valve regurgitation is mild to moderate. Mild aortic valve sclerosis without stenosis.  9. TR signal is inadequate for assessing pulmonary artery systolic pressure. 10. The inferior vena cava is dilated in size with <50% respiratory variability, suggesting right atrial pressure of 15 mmHg.  2009 /2016 Caths: See scans under procedures  Laboratory Data:  High Sensitivity Troponin:   Recent Labs  Lab 06/08/19 0846 06/08/19 1028 06/09/19 0456  TROPONINIHS 29* 28* 21*     Cardiac EnzymesNo results for input(s): TROPONINI in the last 168 hours. No results for input(s): TROPIPOC in the last 168 hours.  Chemistry Recent Labs  Lab 06/08/19 0846 06/08/19 1256 06/09/19 0456  NA 138  --  138  K 4.4  --  4.1  CL 102  --  107  CO2 26  --  23  GLUCOSE 105*  --  104*  BUN 20  --  24*  CREATININE 0.89 0.94 0.85  CALCIUM 9.2  --  8.7*  GFRNONAA >60 >60 >60  GFRAA >60 >60 >60  ANIONGAP 10  --  8    Recent Labs  Lab 06/09/19 0456  PROT 7.4  ALBUMIN 3.1*  AST 18  ALT 11  ALKPHOS 93  BILITOT 0.8   Hematology Recent Labs  Lab 06/08/19 0846 06/08/19 1256 06/09/19 0456  WBC 6.1 4.4 10.6*  RBC 4.67 4.62 4.24  HGB 14.0 13.9 12.8*  HCT 42.7 41.8 39.4  MCV  91.4 90.5 92.9  MCH 30.0 30.1 30.2  MCHC 32.8 33.3 32.5  RDW 13.0 13.0 12.8  PLT 243 232 228   BNPNo results for input(s): BNP, PROBNP in the last 168 hours.  DDimer No results for input(s): DDIMER in the last 168 hours.   Radiology/Studies:  Dg Chest 2 View  Result Date: 06/08/2019 CLINICAL DATA:  Chest pain. History of COPD. EXAM: CHEST - 2 VIEW COMPARISON:  01/01/2016 FINDINGS: Sequelae of CABG are again identified. The cardiomediastinal silhouette is unchanged with normal heart size. The lungs are hyperinflated with chronic peribronchial thickening. No airspace consolidation, edema, pleural effusion, pneumothorax is identified. No acute osseous abnormality is seen. IMPRESSION: COPD without evidence of active cardiopulmonary disease. Electronically Signed   By: Sebastian Ache M.D.   On: 06/08/2019 09:23    Assessment and Plan:   Unstable angina History of CAD / CABG ([facility], 2012)  --No current CP. Significant h/o CAD with 11/29 sx concerning for cardiac ischemic event. CP described as before his previous cardiac interventions with h/o stenting, 2012 CABG, and 2016 catheterization with occluded grafts x2 as outlined above. EKG as above with nonspecific ST/T changes and repolarization abnormalities due to LVH. HS Tn minimally elevated at 29; however, consider also that the first resulted HS Tn was his peak Tn and could have been collected after his enzymes were already down-trending. Echo shows EF 35-40% and global  hypokinesis with severe hypokinesis of the anterior, anteroseptal and anterolateral walls. Previous cath EF 30%. Patient has several risk factors for cardiac etiology including current smoker at 1 pack daily, as well as intermittent medication and therapy noncompliance. --Plan for LHC with cors / grafts and possible PCI today. Risks and benefits of cardiac catheterization have been discussed with the patient.  These include bleeding, infection, kidney damage, stroke, heart attack,  death.  The patient understands these risks and is willing to proceed. --Following cath, tentative plan to restart PTA cardiac medications as tolerated and to include ASA  qd, Plavix  qd, metoprolol  BID to be titrated as HR/BP allows and consolidated to Toprol  daily by discharge, lasix  daily as needed for LEE / abdominal distention, lisinopril  qd as renal function allows, and PRN SL nitro as needed for CP. Patient not on a PTA statin with recommendation for statin as below before discharge. Aggressive risk factor modification recommended. Further recommendations after cath.  Chronic Systolic heart failure (EF 35-40%, 06/08/2019) --No current shortness of breath or lower extremity edema.  Euvolemic on exam. --11/29 echo as above shows EF 35 to 40% and global hypokinesis. EF 30% on prior cath as above.  --Following cardiac catheterization, tentative plan to restart PTA PRN Lasix 20 mg qd for LEE/abdominal distention, as well as lisinopril 10 mg daily as renal function allows.  Mild to Moderate MR --Recommend continue to monitor with periodic echo and with any worsening symptoms with intermittent echos as an outpatient.   HTN --136/82 and suboptimally controlled with SBP into the 140s yesterday. As above, restart PTA antihypertensive medications after cath and titrate as needed for optimal BP support.   HLD --Not on PTA statin. Rechecked LDL 96.  Live enzymes stable. Recommend start statin before discharge. If starts statin, will need f/u lipid and liver labs in 6-8 weeks.   Current tobacco abuse / COPD --No current SOB. COPD on CXR. Smokes 1 pack daily.  Indicated desire to quit.  Stated he is working on cutting back.  Cessation encouraged.   For questions or updates, please contact [facility] Please consult [email or web address] for contact info under     Signed, Lennon Alstrom, PA-C  06/09/2019 9:46 AM

## 2019-06-09 NOTE — Progress Notes (Signed)
Ch visited with pt who is a 63 y.o. male that was admitted due to chest pain. Pt was anxious to be d/c upon chaplain arrival. Pt had significant other at bedside as he shared that his sister id the HPOA while ch provided AD education. Ch informed pt that he desired to make any changes to inform his family and to have the document finalized with a notary. Ch communicated with staff to ensure that the pt had a mask to wear while during his admission. No further needs at this time.   06/09/19 0900  Clinical Encounter Type  Visited With Patient and family together;Health care provider  Visit Type Social support;Other (Comment) (AD edu)  Referral From Nurse  Consult/Referral To Chaplain  Stress Factors  Patient Stress Factors Health changes;Major life changes;Loss of control  Family Stress Factors None identified  Advance Directives (For Healthcare)  Does Patient Have a Medical Advance Directive? No  Would patient like information on creating a medical advance directive? Yes (Inpatient - patient defers creating a medical advance directive at this time - Information given)

## 2019-06-09 NOTE — Progress Notes (Signed)
Patient adamant about leaving hospital despite several attempts to get patient to continue to be monitored further until this afternoon for post cath. IVs and tele removed. Left radial site C/D/I level 0. No swelling, bruising or bleeding noted. Discharge instructions given to patient and wife. Verbalized understanding. No acute distress at this time. Wheelchair was called however when volunteer arrived patient had walked himself out.

## 2019-11-05 ENCOUNTER — Ambulatory Visit: Payer: Medicare Other | Admitting: Family Medicine

## 2019-12-25 ENCOUNTER — Telehealth: Payer: Self-pay | Admitting: Nurse Practitioner

## 2019-12-25 DIAGNOSIS — J432 Centrilobular emphysema: Secondary | ICD-10-CM

## 2019-12-25 NOTE — Telephone Encounter (Signed)
Requested medication (s) are due for refill today:  Yes  Requested medication (s) are on the active medication list:  Yes  Future visit scheduled:  No  Last Refill: Albuterol; 01/27/19; 18 g.; RF x 1                   Spiriva;  09/04/18; #30; RF x 12                   Advair;  09/04/18;  #60; RF x 11  Notes to clinic; last ov with Wilhelmina Mcardle 01/2019; had appt. Sched. With N. Malfi 10/2019 and no showed.          Requested Prescriptions  Pending Prescriptions Disp Refills   albuterol (VENTOLIN HFA) 108 (90 Base) MCG/ACT inhaler [Pharmacy Med Name: ALBUTEROL HFA (PROAIR) INHALER]      Sig: TAKE 2 PUFFS BY MOUTH EVERY 6 HOURS AS NEEDED FOR WHEEZE OR SHORTNESS OF BREATH      Pulmonology:  Beta Agonists Failed - 12/25/2019  5:25 PM      Failed - One inhaler should last at least one month. If the patient is requesting refills earlier, contact the patient to check for uncontrolled symptoms.      Passed - Valid encounter within last 12 months    Recent Outpatient Visits           11 months ago Centrilobular emphysema (HCC)   [facility] Kyung Rudd, Alison Stalling, NP   1 year ago Seasonal allergies   [facility] Galen Manila, NP   1 year ago Chronic systolic CHF (congestive heart failure) [facility])   [facility], Alison Stalling, NP   4 years ago Coronary artery disease involving native coronary artery of native heart with other form of angina pectoris [facility])   [facility] Laroy Apple, Amy Lauren, NP   4 years ago Chronic systolic CHF (congestive heart failure) (HCC)   [facility] Laroy Apple, Amy Lauren, NP                SPIRIVA HANDIHALER 18 MCG inhalation capsule [Pharmacy Med Name: SPIRIVA 18 MCG CP-HANDIHALER] 30 capsule 12    Sig: INHALE 1 CAPSULE VIA HANDIHALER ONCE DAILY AT THE SAME TIME EVERY DAY      Pulmonology:  Anticholinergic Agents Passed - 12/25/2019  5:25 PM      Passed - Valid encounter  within last 12 months    Recent Outpatient Visits           11 months ago Centrilobular emphysema (HCC)   [facility] Kyung Rudd, Alison Stalling, NP   1 year ago Seasonal allergies   [facility] Galen Manila, NP   1 year ago Chronic systolic CHF (congestive heart failure) [facility])   [facility], Alison Stalling, NP   4 years ago Coronary artery disease involving native coronary artery of native heart with other form of angina pectoris [facility])   [facility] Laroy Apple, Amy Lauren, NP   4 years ago Chronic systolic CHF (congestive heart failure) (HCC)   [facility] Krebs, Amy Lauren, NP                ADVAIR DISKUS 250-50 MCG/DOSE Kary Kos Med Name: ADVAIR 250-50 DISKUS] 60 each 11    Sig: TAKE 1 PUFF BY MOUTH TWICE A DAY      Pulmonology:  Combination Products Passed -  12/25/2019  5:25 PM      Passed - Valid encounter within last 12 months    Recent Outpatient Visits           11 months ago Centrilobular emphysema ([place])   [facility] Merrilyn Puma, Jerrel Ivory, NP   1 year ago Seasonal allergies   [facility] Mikey College, NP   1 year ago Chronic systolic CHF (congestive heart failure) [facility])   [facility], Jerrel Ivory, NP   4 years ago Coronary artery disease involving native coronary artery of native heart with other form of angina pectoris [facility])   [facility] Vincenza Hews, Amy Lauren, NP   4 years ago Chronic systolic CHF (congestive heart failure) [facility])   [facility] Vincenza Hews, Genevie Cheshire, NP

## 2019-12-29 ENCOUNTER — Telehealth (INDEPENDENT_AMBULATORY_CARE_PROVIDER_SITE_OTHER): Payer: Medicare Other | Admitting: Family Medicine

## 2019-12-29 ENCOUNTER — Encounter: Payer: Self-pay | Admitting: Family Medicine

## 2019-12-29 ENCOUNTER — Other Ambulatory Visit: Payer: Self-pay

## 2019-12-29 DIAGNOSIS — J432 Centrilobular emphysema: Secondary | ICD-10-CM

## 2019-12-29 DIAGNOSIS — I5022 Chronic systolic (congestive) heart failure: Secondary | ICD-10-CM

## 2019-12-29 DIAGNOSIS — E7800 Pure hypercholesterolemia, unspecified: Secondary | ICD-10-CM

## 2019-12-29 DIAGNOSIS — J302 Other seasonal allergic rhinitis: Secondary | ICD-10-CM | POA: Diagnosis not present

## 2019-12-29 DIAGNOSIS — I10 Essential (primary) hypertension: Secondary | ICD-10-CM

## 2019-12-29 MED ORDER — ATORVASTATIN CALCIUM 40 MG PO TABS: 40.0000 mg | ORAL_TABLET | Freq: Every day | ORAL | 0 refills | Status: DC

## 2019-12-29 MED ORDER — ALBUTEROL SULFATE HFA 108 (90 BASE) MCG/ACT IN AERS: 2.0000 | INHALATION_SPRAY | Freq: Four times a day (QID) | RESPIRATORY_TRACT | 0 refills | Status: DC | PRN

## 2019-12-29 MED ORDER — SPIRIVA HANDIHALER 18 MCG IN CAPS: 18.0000 ug | ORAL_CAPSULE | Freq: Every day | RESPIRATORY_TRACT | 0 refills | Status: DC

## 2019-12-29 MED ORDER — LORATADINE 10 MG PO TABS: 10.0000 mg | ORAL_TABLET | Freq: Every day | ORAL | 0 refills | Status: DC

## 2019-12-29 MED ORDER — METOPROLOL SUCCINATE ER 25 MG PO TB24: 25.0000 mg | ORAL_TABLET | Freq: Every day | ORAL | 0 refills | Status: DC

## 2019-12-29 MED ORDER — NITROGLYCERIN 0.4 MG SL SUBL: 0.4000 mg | SUBLINGUAL_TABLET | SUBLINGUAL | 0 refills | Status: DC | PRN

## 2019-12-29 MED ORDER — FUROSEMIDE 20 MG PO TABS: 20.0000 mg | ORAL_TABLET | Freq: Every day | ORAL | 0 refills | Status: DC | PRN

## 2019-12-29 MED ORDER — FLUTICASONE-SALMETEROL 250-50 MCG/DOSE IN AEPB: 1.0000 | INHALATION_SPRAY | Freq: Two times a day (BID) | RESPIRATORY_TRACT | 0 refills | Status: DC

## 2019-12-29 NOTE — Progress Notes (Signed)
Virtual Visit via Telephone  The purpose of this virtual visit is to provide medical care while limiting exposure to the novel coronavirus (COVID19) for both patient and office staff.  Consent was obtained for phone visit:  Yes.   Answered questions that patient had about telehealth interaction:  Yes.   I discussed the limitations, risks, security and privacy concerns of performing an evaluation and management service by telephone. I also discussed with the patient that there may be a patient responsible charge related to this service. The patient expressed understanding and agreed to proceed.  Patient is at home and is accessed via telephone Services are provided by Harlin Rain, FNP-C from [facility])  ---------------------------------------------------------------------- Chief Complaint  Patient presents with  . Emphysema    need refills on his inhaler     S: Reviewed CMA documentation. I have called patient and gathered additional HPI as follows:  Mr. Frady presents for telemedicine visit for medication refills.  Reports he has run out of all of his prescriptions a few days ago.  Denies any other acute concerns  Patient is currently home Denies any high risk travel to areas of current concern for COVID19. Denies any known or suspected exposure to person with or possibly with COVID19.  Denies any fevers, chills, sweats, body ache, cough, shortness of breath, sinus pain or pressure, headache, abdominal pain, diarrhea  Past Medical History:  Diagnosis Date  . Acute headache   . CHF (congestive heart failure) ([place])   . COPD (chronic obstructive pulmonary disease) ([place])   . Coronary artery disease    a. cath 2009 w/ severe 3-vessel dz, rec bypass; b. MI 2012-->4-vessel cabg; c. cath 2016: occluded VG-OM and VG-RPDA, patent LIMA-LAD and patent VG-Diag, 95% stenosis of mid LCx s/p PCI/DES 0% residual stenosis  . Depression   . Hepatitis B   .  Hepatitis C   . HFrEF (heart failure with reduced ejection fraction) ([place])    a. LV gram 2009 with EF 32% with anterolateral and apical HK  . Hyperlipidemia   . Hypertension   . MI (myocardial infarction) ([place])    x 2  . Tobacco abuse    Social History   Tobacco Use  . Smoking status: Current Every Day Smoker    Packs/day: 1.50    Years: 42.00    Pack years: 63.00    Types: Cigarettes  . Smokeless tobacco: Never Used  Vaping Use  . Vaping Use: Never used  Substance Use Topics  . Alcohol use: No  . 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  albuterol (VENTOLIN HFA) 108 (90 Base) MCG/ACT inhaler, Inhale 2 puffs into the lungs every 6 (six) hours as needed for wheezing or shortness of breath., Disp: 18 g, Rfl: 0 .  aspirin 81 MG chewable tablet, Chew 1 tablet (81 mg total) by mouth daily., Disp: 30 tablet, Rfl: 11 .  atorvastatin (LIPITOR) 40 MG tablet, Take 1 tablet (40 mg total) by mouth daily at 6 PM., Disp: 30 tablet, Rfl: 0 .  Fluticasone-Salmeterol (ADVAIR) 250-50 MCG/DOSE AEPB, Inhale 1 puff into the lungs 2 (two) times daily., Disp: 60 each, Rfl: 0 .  furosemide (LASIX) 20 MG tablet, Take 1 tablet (20 mg total) by mouth daily as needed., Disp: 30 tablet, Rfl: 0 .  loratadine (CLARITIN) 10 MG tablet, Take 1 tablet (10 mg total) by mouth daily., Disp: 30 tablet, Rfl: 0 .  metoprolol succinate (TOPROL-XL) 25 MG 24 hr tablet, Take 1 tablet (25 mg total) by mouth daily., Disp: 30 tablet, Rfl: 0 .  nitroGLYCERIN (NITROSTAT) 0.4 MG SL tablet, Place 1 tablet (0.4 mg total) under the tongue every 5 (five) minutes as needed for chest pain. Reported on 09/29/2015, Disp: 10 tablet, Rfl: 0 .  tiotropium (SPIRIVA HANDIHALER) 18 MCG inhalation capsule, Place 1 capsule (18 mcg total) into inhaler and inhale daily., Disp: 30 capsule, Rfl: 0  Depression screen [facility] 2/9 09/04/2018  Decreased Interest 0  Down, Depressed, Hopeless 0  PHQ - 2 Score 0    No  flowsheet data found.  -------------------------------------------------------------------------- O: No physical exam performed due to remote telephone encounter.  Physical Exam: Patient remotely monitored without video.  Verbal communication appropriate.  Cognition normal.  No results found for this or any previous visit (from the past 2160 hour(s)).  -------------------------------------------------------------------------- A&P:  Problem List Items Addressed This Visit      Cardiovascular and Mediastinum   Chronic systolic CHF (congestive heart failure) (HCC)    Medications needing to be refilled.  No blood pressures being checked at home.  Medications refilled and educated to come into clinic within 30 days for next visit to check on this, as well as labs.      Relevant Medications   atorvastatin (LIPITOR) 40 MG tablet   furosemide (LASIX) 20 MG tablet   metoprolol succinate (TOPROL-XL) 25 MG 24 hr tablet   nitroGLYCERIN (NITROSTAT) 0.4 MG SL tablet   Essential (primary) hypertension    Medications needing to be refilled.  No blood pressures being checked at home.  Medications refilled and educated to come into clinic within 30 days for next visit to check on this, as well as labs.      Relevant Medications   atorvastatin (LIPITOR) 40 MG tablet   furosemide (LASIX) 20 MG tablet   metoprolol succinate (TOPROL-XL) 25 MG 24 hr tablet   nitroGLYCERIN (NITROSTAT) 0.4 MG SL tablet     Respiratory   Emphysema of lung (HCC)    Medications needing to be refilled.  No exam within the last year for lung evaluation.  Medications refilled and educated to come into clinic within 30 days for next visit to check on this, as well as labs.      Relevant Medications   albuterol (VENTOLIN HFA) 108 (90 Base) MCG/ACT inhaler   Fluticasone-Salmeterol (ADVAIR) 250-50 MCG/DOSE AEPB   loratadine (CLARITIN) 10 MG tablet   tiotropium (SPIRIVA HANDIHALER) 18 MCG inhalation capsule     Other    Hypercholesteremia - Primary    Status unknown.  Recheck labs.  Continue meds without changes today.  Refills provided. Followup after labs.       Relevant Medications   atorvastatin (LIPITOR) 40 MG tablet   furosemide (LASIX) 20 MG tablet   metoprolol succinate (TOPROL-XL) 25 MG 24 hr tablet   nitroGLYCERIN (NITROSTAT) 0.4 MG SL tablet    Other Visit Diagnoses    Seasonal allergies       Relevant Medications   loratadine (CLARITIN) 10 MG tablet      Meds ordered this encounter  Medications  . albuterol (VENTOLIN HFA) 108 (90 Base) MCG/ACT inhaler    Sig: Inhale 2 puffs into the lungs every 6 (six) hours as needed for wheezing or shortness of breath.    Dispense:  18 g    Refill:  0    Needs OV for any additional refills  . Fluticasone-Salmeterol (ADVAIR) 250-50 MCG/DOSE  AEPB    Sig: Inhale 1 puff into the lungs 2 (two) times daily.    Dispense:  60 each    Refill:  0    Needs OV for any additional refills  . atorvastatin (LIPITOR) 40 MG tablet    Sig: Take 1 tablet (40 mg total) by mouth daily at 6 PM.    Dispense:  30 tablet    Refill:  0    Needs OV for any additional refills  . furosemide (LASIX) 20 MG tablet    Sig: Take 1 tablet (20 mg total) by mouth daily as needed.    Dispense:  30 tablet    Refill:  0    Needs OV for any additional refills  . loratadine (CLARITIN) 10 MG tablet    Sig: Take 1 tablet (10 mg total) by mouth daily.    Dispense:  30 tablet    Refill:  0    Needs OV for any additional refills  . metoprolol succinate (TOPROL-XL) 25 MG 24 hr tablet    Sig: Take 1 tablet (25 mg total) by mouth daily.    Dispense:  30 tablet    Refill:  0    Needs OV for any additional refills  . tiotropium (SPIRIVA HANDIHALER) 18 MCG inhalation capsule    Sig: Place 1 capsule (18 mcg total) into inhaler and inhale daily.    Dispense:  30 capsule    Refill:  0    Needs OV for any additional refills  . nitroGLYCERIN (NITROSTAT) 0.4 MG SL tablet    Sig: Place 1  tablet (0.4 mg total) under the tongue every 5 (five) minutes as needed for chest pain. Reported on 09/29/2015    Dispense:  10 tablet    Refill:  0    Needs OV for any additional refills    Follow-up: - Return within 30 days for in office visit for evaluation of chronic conditions  Patient verbalizes understanding with the above medical recommendations including the limitation of remote medical advice.  Specific follow-up and call-back criteria were given for patient to follow-up or seek medical care more urgently if needed.  - Time spent in direct consultation with patient on phone: 7 minutes  Charlaine Dalton, FNP-C [facility] 12/29/2019, 4:04 PM

## 2019-12-29 NOTE — Assessment & Plan Note (Signed)
Medications needing to be refilled.  No blood pressures being checked at home.  Medications refilled and educated to come into clinic within 30 days for next visit to check on this, as well as labs. 

## 2019-12-29 NOTE — Assessment & Plan Note (Signed)
Status unknown.  Recheck labs.  Continue meds without changes today.  Refills provided. Followup after labs.  

## 2019-12-29 NOTE — Assessment & Plan Note (Signed)
Medications needing to be refilled.  No exam within the last year for lung evaluation.  Medications refilled and educated to come into clinic within 30 days for next visit to check on this, as well as labs.

## 2019-12-29 NOTE — Assessment & Plan Note (Signed)
Medications needing to be refilled.  No blood pressures being checked at home.  Medications refilled and educated to come into clinic within 30 days for next visit to check on this, as well as labs.

## 2019-12-30 ENCOUNTER — Other Ambulatory Visit: Payer: Self-pay | Admitting: Nurse Practitioner

## 2019-12-30 DIAGNOSIS — J432 Centrilobular emphysema: Secondary | ICD-10-CM

## 2020-01-06 ENCOUNTER — Other Ambulatory Visit: Payer: Self-pay | Admitting: Family Medicine

## 2020-01-06 DIAGNOSIS — J432 Centrilobular emphysema: Secondary | ICD-10-CM

## 2020-01-06 MED ORDER — INCRUSE ELLIPTA 62.5 MCG/INH IN AEPB: 1.0000 | INHALATION_SPRAY | Freq: Every day | RESPIRATORY_TRACT | 1 refills | Status: DC

## 2020-01-06 NOTE — Progress Notes (Signed)
Pharmacy sent Rx request to change spiriva to incruse due to formulary coverage.  New Rx sent.

## 2020-01-21 ENCOUNTER — Other Ambulatory Visit: Payer: Self-pay | Admitting: Family Medicine

## 2020-01-21 ENCOUNTER — Ambulatory Visit: Payer: Medicare Other | Admitting: Family Medicine

## 2020-01-21 DIAGNOSIS — I5022 Chronic systolic (congestive) heart failure: Secondary | ICD-10-CM

## 2020-01-21 DIAGNOSIS — J432 Centrilobular emphysema: Secondary | ICD-10-CM

## 2020-01-22 ENCOUNTER — Telehealth: Payer: Medicare Other | Admitting: Family Medicine

## 2020-01-22 ENCOUNTER — Other Ambulatory Visit: Payer: Self-pay

## 2020-01-26 ENCOUNTER — Other Ambulatory Visit: Payer: Self-pay | Admitting: Family Medicine

## 2020-01-26 DIAGNOSIS — E7800 Pure hypercholesterolemia, unspecified: Secondary | ICD-10-CM

## 2020-01-26 DIAGNOSIS — I5022 Chronic systolic (congestive) heart failure: Secondary | ICD-10-CM

## 2020-01-26 DIAGNOSIS — J302 Other seasonal allergic rhinitis: Secondary | ICD-10-CM

## 2020-01-26 NOTE — Telephone Encounter (Signed)
Requested medication (s) are due for refill today:  Yes for all requested refills  Requested medication (s) are on the active medication list:   Yes  Future visit scheduled:   No   Last ordered: 12/29/2019 #30, RF 0  Passed protocol however returned for provider to review for refills since 30 day supply with 0 refills given.       Requested Prescriptions  Pending Prescriptions Disp Refills   loratadine (CLARITIN) 10 MG tablet [Pharmacy Med Name: LORATADINE 10 MG TABLET] 30 tablet 0    Sig: TAKE 1 TABLET BY MOUTH EVERY DAY      Ear, Nose, and Throat:  Antihistamines Passed - 01/26/2020  1:31 AM      Passed - Valid encounter within last 12 months    Recent Outpatient Visits           4 weeks ago Hypercholesteremia   [facility], Jodelle Gross, FNP   12 months ago Centrilobular emphysema [facility])   [facility] Kyung Rudd, Alison Stalling, NP   1 year ago Seasonal allergies   [facility] Galen Manila, NP   1 year ago Chronic systolic CHF (congestive heart failure) [facility])   [facility], Alison Stalling, NP   4 years ago Coronary artery disease involving native coronary artery of native heart with other form of angina pectoris (HCC)   [facility] Laroy Apple, Amy Lauren, NP                metoprolol succinate (TOPROL-XL) 25 MG 24 hr tablet [Pharmacy Med Name: METOPROLOL SUCC ER 25 MG TAB] 30 tablet 0    Sig: TAKE 1 TABLET BY MOUTH EVERY DAY      Cardiovascular:  Beta Blockers Passed - 01/26/2020  1:31 AM      Passed - Last BP in normal range    BP Readings from Last 1 Encounters:  06/09/19 124/70          Passed - Last Heart Rate in normal range    Pulse Readings from Last 1 Encounters:  06/09/19 78          Passed - Valid encounter within last 6 months    Recent Outpatient Visits           4 weeks ago Hypercholesteremia   [facility], Jodelle Gross, FNP   12 months ago  Centrilobular emphysema [facility])   [facility] Kyung Rudd, Alison Stalling, NP   1 year ago Seasonal allergies   [facility] Galen Manila, NP   1 year ago Chronic systolic CHF (congestive heart failure) [facility])   [facility], Alison Stalling, NP   4 years ago Coronary artery disease involving native coronary artery of native heart with other form of angina pectoris (HCC)   [facility] Krebs, Amy Lauren, NP                furosemide (LASIX) 20 MG tablet [Pharmacy Med Name: FUROSEMIDE 20 MG TABLET] 30 tablet 0    Sig: TAKE 1 TABLET BY MOUTH EVERY DAY AS NEEDED      Cardiovascular:  Diuretics - Loop Failed - 01/26/2020  1:31 AM      Failed - Ca in normal range and within 360 days    Calcium  Date Value Ref Range Status  06/09/2019 8.7 (L) 8.9 - 10.3 mg/dL Final   Calcium, Total  Date Value Ref Range Status  10/27/2014 8.1 (L) mg/dL Final    Comment:    2.7-06.2 NOTE: New Reference Range  09/15/14           Passed - K in normal range and within 360 days    Potassium  Date Value Ref Range Status  06/09/2019 4.1 3.5 - 5.1 mmol/L Final  10/27/2014 3.6 mmol/L Final    Comment:    3.5-5.1 NOTE: New Reference Range  09/15/14           Passed - Na in normal range and within 360 days    Sodium  Date Value Ref Range Status  06/09/2019 138 135 - 145 mmol/L Final  10/27/2014 137 mmol/L Final    Comment:    135-145 NOTE: New Reference Range  09/15/14           Passed - Cr in normal range and within 360 days    Creatinine  Date Value Ref Range Status  10/27/2014 0.91 mg/dL Final    Comment:    3.76-2.83 NOTE: New Reference Range  09/15/14    Creatinine, Ser  Date Value Ref Range Status  06/09/2019 0.85 0.61 - 1.24 mg/dL Final          Passed - Last BP in normal range    BP Readings from Last 1 Encounters:  06/09/19 124/70          Passed - Valid encounter within last 6 months     Recent Outpatient Visits           4 weeks ago Hypercholesteremia   [facility], Jodelle Gross, FNP   12 months ago Centrilobular emphysema [facility])   [facility] Galen Manila, NP   1 year ago Seasonal allergies   [facility] Galen Manila, NP   1 year ago Chronic systolic CHF (congestive heart failure) [facility])   [facility] Kyung Rudd, Alison Stalling, NP   4 years ago Coronary artery disease involving native coronary artery of native heart with other form of angina pectoris (HCC)   [facility] Laroy Apple, Amy Lauren, NP                atorvastatin (LIPITOR) 40 MG tablet [Pharmacy Med Name: ATORVASTATIN 40 MG TABLET] 30 tablet 0    Sig: Take 1 tablet (40 mg total) by mouth daily at 6 PM.      Cardiovascular:  Antilipid - Statins Failed - 01/26/2020  1:31 AM      Failed - HDL in normal range and within 360 days    HDL  Date Value Ref Range Status  06/08/2019 38 (L) >40 mg/dL Final          Passed - Total Cholesterol in normal range and within 360 days    Cholesterol  Date Value Ref Range Status  06/08/2019 144 0 - 200 mg/dL Final          Passed - LDL in normal range and within 360 days    LDL Cholesterol  Date Value Ref Range Status  06/08/2019 96 0 - 99 mg/dL Final    Comment:           Total Cholesterol/HDL:CHD Risk Coronary Heart Disease Risk Table                     Men   Women  1/2 Average Risk   3.4   3.3  Average  Risk       5.0   4.4  2 X Average Risk   9.6   7.1  3 X Average Risk  23.4   11.0        Use the calculated Patient Ratio above and the CHD Risk Table to determine the patient's CHD Risk.        ATP III CLASSIFICATION (LDL):  <100     mg/dL   Optimal  481-856  mg/dL   Near or Above                    Optimal  130-159  mg/dL   Borderline  314-970  mg/dL   High  >263     mg/dL   Very High Performed at [facility], [address] [place],  [place] [address]           Passed - Triglycerides in normal range and within 360 days    Triglycerides  Date Value Ref Range Status  06/08/2019 48 <150 mg/dL Final          Passed - Patient is not pregnant      Passed - Valid encounter within last 12 months    Recent Outpatient Visits           4 weeks ago Hypercholesteremia   [facility], Jodelle Gross, FNP   12 months ago Centrilobular emphysema [facility])   [facility] Kyung Rudd, Alison Stalling, NP   1 year ago Seasonal allergies   [facility] Galen Manila, NP   1 year ago Chronic systolic CHF (congestive heart failure) [facility])   [facility], Alison Stalling, NP   4 years ago Coronary artery disease involving native coronary artery of native heart with other form of angina pectoris [facility])   [facility] Lar[facility] Apple, Laurel Dimmer, NP

## 2020-08-30 ENCOUNTER — Telehealth: Payer: Self-pay | Admitting: Cardiovascular Disease

## 2020-08-30 NOTE — Telephone Encounter (Signed)
3 attempts to schedule fu appt from recall list.   Deleting recall.   

## 2020-09-15 ENCOUNTER — Emergency Department
Admission: EM | Admit: 2020-09-15 | Discharge: 2020-09-15 | Discharge status: Against Medical Advice | Payer: Medicare Other | Attending: Emergency Medicine | Admitting: Emergency Medicine

## 2020-09-15 ENCOUNTER — Encounter: Payer: Self-pay | Admitting: *Deleted

## 2020-09-15 ENCOUNTER — Other Ambulatory Visit: Payer: Self-pay

## 2020-09-15 ENCOUNTER — Emergency Department: Payer: Medicare Other

## 2020-09-15 DIAGNOSIS — R079 Chest pain, unspecified: Secondary | ICD-10-CM

## 2020-09-15 DIAGNOSIS — F17210 Nicotine dependence, cigarettes, uncomplicated: Secondary | ICD-10-CM | POA: Diagnosis not present

## 2020-09-15 DIAGNOSIS — I5030 Unspecified diastolic (congestive) heart failure: Secondary | ICD-10-CM | POA: Diagnosis not present

## 2020-09-15 DIAGNOSIS — Z951 Presence of aortocoronary bypass graft: Secondary | ICD-10-CM | POA: Insufficient documentation

## 2020-09-15 DIAGNOSIS — I110 Hypertensive heart disease with heart failure: Secondary | ICD-10-CM | POA: Diagnosis not present

## 2020-09-15 DIAGNOSIS — Z79899 Other long term (current) drug therapy: Secondary | ICD-10-CM | POA: Insufficient documentation

## 2020-09-15 DIAGNOSIS — Z7951 Long term (current) use of inhaled steroids: Secondary | ICD-10-CM | POA: Insufficient documentation

## 2020-09-15 DIAGNOSIS — F0391 Unspecified dementia with behavioral disturbance: Secondary | ICD-10-CM | POA: Insufficient documentation

## 2020-09-15 DIAGNOSIS — Z20822 Contact with and (suspected) exposure to covid-19: Secondary | ICD-10-CM | POA: Diagnosis not present

## 2020-09-15 DIAGNOSIS — Z955 Presence of coronary angioplasty implant and graft: Secondary | ICD-10-CM | POA: Insufficient documentation

## 2020-09-15 DIAGNOSIS — I25118 Atherosclerotic heart disease of native coronary artery with other forms of angina pectoris: Secondary | ICD-10-CM | POA: Insufficient documentation

## 2020-09-15 DIAGNOSIS — J449 Chronic obstructive pulmonary disease, unspecified: Secondary | ICD-10-CM | POA: Diagnosis not present

## 2020-09-15 DIAGNOSIS — Z7982 Long term (current) use of aspirin: Secondary | ICD-10-CM | POA: Insufficient documentation

## 2020-09-15 DIAGNOSIS — R778 Other specified abnormalities of plasma proteins: Secondary | ICD-10-CM

## 2020-09-15 DIAGNOSIS — R7989 Other specified abnormal findings of blood chemistry: Secondary | ICD-10-CM | POA: Insufficient documentation

## 2020-09-15 DIAGNOSIS — J441 Chronic obstructive pulmonary disease with (acute) exacerbation: Secondary | ICD-10-CM

## 2020-09-15 LAB — CBC
HCT: 38.3 % — ABNORMAL LOW (ref 39.0–52.0)
Hemoglobin: 12.1 g/dL — ABNORMAL LOW (ref 13.0–17.0)
MCH: 30.3 pg (ref 26.0–34.0)
MCHC: 31.6 g/dL (ref 30.0–36.0)
MCV: 96 fL (ref 80.0–100.0)
Platelets: 213 10*3/uL (ref 150–400)
RBC: 3.99 MIL/uL — ABNORMAL LOW (ref 4.22–5.81)
RDW: 13 % (ref 11.5–15.5)
WBC: 8.1 10*3/uL (ref 4.0–10.5)
nRBC: 0 % (ref 0.0–0.2)

## 2020-09-15 LAB — HEPATIC FUNCTION PANEL
ALT: 13 U/L (ref 0–44)
AST: 29 U/L (ref 15–41)
Albumin: 3.2 g/dL — ABNORMAL LOW (ref 3.5–5.0)
Alkaline Phosphatase: 93 U/L (ref 38–126)
Bilirubin, Direct: 0.2 mg/dL (ref 0.0–0.2)
Indirect Bilirubin: 0.8 mg/dL (ref 0.3–0.9)
Total Bilirubin: 1 mg/dL (ref 0.3–1.2)
Total Protein: 7.5 g/dL (ref 6.5–8.1)

## 2020-09-15 LAB — BASIC METABOLIC PANEL
Anion gap: 8 (ref 5–15)
BUN: 17 mg/dL (ref 8–23)
CO2: 27 mmol/L (ref 22–32)
Calcium: 8.6 mg/dL — ABNORMAL LOW (ref 8.9–10.3)
Chloride: 101 mmol/L (ref 98–111)
Creatinine, Ser: 1.12 mg/dL (ref 0.61–1.24)
GFR, Estimated: >60 mL/min (ref >60)
Glucose, Bld: 125 mg/dL — ABNORMAL HIGH (ref 70–99)
Potassium: 4.2 mmol/L (ref 3.5–5.1)
Sodium: 136 mmol/L (ref 135–145)

## 2020-09-15 LAB — TROPONIN I (HIGH SENSITIVITY)
Troponin I (High Sensitivity): 56 ng/L — ABNORMAL HIGH (ref <18)
Troponin I (High Sensitivity): 62 ng/L — ABNORMAL HIGH (ref <18)

## 2020-09-15 LAB — BRAIN NATRIURETIC PEPTIDE: B Natriuretic Peptide: 640.1 pg/mL — ABNORMAL HIGH (ref 0.0–100.0)

## 2020-09-15 LAB — LIPASE, BLOOD: Lipase: 20 U/L (ref 11–51)

## 2020-09-15 MED ORDER — METHYLPREDNISOLONE SODIUM SUCC 125 MG IJ SOLR
125.0000 mg | Freq: Once | INTRAMUSCULAR | Status: AC
  Administered 2020-09-15: 125 mg via INTRAVENOUS
  Filled 2020-09-15: qty 2

## 2020-09-15 MED ORDER — ALBUTEROL SULFATE (2.5 MG/3ML) 0.083% IN NEBU
2.5000 mg | INHALATION_SOLUTION | Freq: Once | RESPIRATORY_TRACT | Status: AC
  Administered 2020-09-15: 2.5 mg via RESPIRATORY_TRACT
  Filled 2020-09-15: qty 3

## 2020-09-15 NOTE — ED Provider Notes (Addendum)
[facility] Emergency Department Provider Note  ____________________________________________  Time seen: Approximately 7:15 PM  I have reviewed the triage vital signs and the nursing notes.   HISTORY  Chief Complaint Chest Pain    HPI Blake [name] is a 65 y.o. male who presents the emergency department complaining of chest/epigastric abdominal pain since this morning.  Patient states that roughly 530 to 6 AM this morning he developed pain in his lower chest/epigastric region.  Patient states that he was already awake so this did not awaken him.  Patient states that he has had some nausea but no emesis.  He has had no shortness of breath.  No radiation of the pain to the back, neck or down either arm.  Patient does have a history of CHF, COPD, coronary artery disease, MI, hypertension.  Patient has had similar pain in the past, was admitted in November 2021 for similar symptoms.  Patient states that the pain has improved throughout the day without intervention.  He states that the pain is a "slight discomfort" at this time which she would rated as a 0.  Given his medical history patient presented to the emergency department for evaluation.  He denies any fevers or chills, nasal congestion, sore throat, cough, frank shortness of breath, emesis, diarrhea or constipation.         Past Medical History:  Diagnosis Date  . Acute headache   . CHF (congestive heart failure) (HCC)   . COPD (chronic obstructive pulmonary disease) (HCC)   . Coronary artery disease    a. cath 2009 w/ severe 3-vessel dz, rec bypass; b. MI 2012-->4-vessel cabg; c. cath 2016: occluded VG-OM and VG-RPDA, patent LIMA-LAD and patent VG-Diag, 95% stenosis of mid LCx s/p PCI/DES 0% residual stenosis  . Depression   . Hepatitis B   . Hepatitis C   . HFrEF (heart failure with reduced ejection fraction) (HCC)    a. LV gram 2009 with EF 32% with anterolateral and apical HK  . Hyperlipidemia   .  Hypertension   . MI (myocardial infarction) (HCC)    x 2  . Tobacco abuse     Patient Active Problem List   Diagnosis Date Noted  . Elevated troponin   . Chest pain 06/08/2019  . Benign prostatic hyperplasia with urinary obstruction 09/29/2015  . Chest pain at rest 09/29/2015  . Cough 09/29/2015  . Current tobacco use 09/29/2015  . Clinical depression 09/29/2015  . Essential (primary) hypertension 09/29/2015  . Cephalalgia 09/29/2015  . Heart disease 09/29/2015  . Hypercholesteremia 09/29/2015  . 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 H/O acute myocardial infarction 09/29/2015  . Alveolar aeration decreased 09/29/2015  . Special screening for malignant neoplasms, colon 09/29/2015  . Chest pain due to CAD (HCC) 09/16/2014  . SOB (shortness of breath) 09/16/2014  . Hepatitis 09/16/2014  . S/P CABG (coronary artery bypass graft) 09/16/2014  . Coronary artery disease involving native coronary artery of native heart with other form of angina pectoris (HCC) 09/16/2014  . Shortness of breath 09/16/2014  . Smoker 09/16/2014  . Emphysema of lung (HCC) 09/16/2014  . Chronic systolic CHF (congestive heart failure) (HCC) 09/16/2014    Past Surgical History:  Procedure Laterality Date  . CARDIAC CATHETERIZATION    . CORONARY ANGIOPLASTY WITH STENT PLACEMENT  2009  . CORONARY ARTERY BYPASS GRAFT  2012   [facility]  . KNEE SURGERY     right knee   . LEFT HEART CATH AND CORS/GRAFTS ANGIOGRAPHY N/A 06/09/2019  Procedure: LEFT HEART CATH AND CORS/GRAFTS ANGIOGRAPHY;  Surgeon: Iran OuchArida, Muhammad A, MD;  Location: [facility] INVASIVE CV LAB;  Service: Cardiovascular;  Laterality: N/A;    Prior to Admission medications   Medication Sig Start Date End Date Taking? Authorizing Provider  albuterol (VENTOLIN HFA) 108 (90 Base) MCG/ACT inhaler Inhale 2 puffs into the lungs every 6 (six) hours as needed for wheezing or shortness of breath. 12/29/19   Malfi, Jodelle GrossNicole M, FNP  aspirin 81 MG chewable tablet Chew 1  tablet (81 mg total) by mouth daily. 09/04/18   Galen ManilaKennedy, Lauren Renee, NP  atorvastatin (LIPITOR) 40 MG tablet Take 1 tablet (40 mg total) by mouth daily at 6 PM. 12/29/19   Malfi, Jodelle GrossNicole M, FNP  Fluticasone-Salmeterol (ADVAIR) 250-50 MCG/DOSE AEPB Inhale 1 puff into the lungs 2 (two) times daily. 12/29/19   Malfi, Jodelle GrossNicole M, FNP  furosemide (LASIX) 20 MG tablet Take 1 tablet (20 mg total) by mouth daily as needed. 12/29/19   Malfi, Jodelle GrossNicole M, FNP  loratadine (CLARITIN) 10 MG tablet Take 1 tablet (10 mg total) by mouth daily. 12/29/19   Malfi, Jodelle GrossNicole M, FNP  metoprolol succinate (TOPROL-XL) 25 MG 24 hr tablet Take 1 tablet (25 mg total) by mouth daily. 12/29/19   Malfi, Jodelle GrossNicole M, FNP  nitroGLYCERIN (NITROSTAT) 0.4 MG SL tablet Place 1 tablet (0.4 mg total) under the tongue every 5 (five) minutes as needed for chest pain. Reported on 09/29/2015 12/29/19   Tarri FullerMalfi, Nicole M, FNP  umeclidinium bromide (INCRUSE ELLIPTA) 62.5 MCG/INH AEPB Inhale 1 puff into the lungs daily. 01/06/20   Malfi, Jodelle GrossNicole M, FNP    Allergies Patient has no known allergies.  Family History  Problem Relation Age of Onset  . Hypertension Mother   . Hypertension Father   . Hypertension Sister   . Hypertension Sister     Social History Social History   Tobacco Use  . Smoking status: Current Every Day Smoker    Packs/day: 1.50    Years: 42.00    Pack years: 63.00    Types: Cigarettes  . Smokeless tobacco: Never Used  Vaping Use  . Vaping Use: Never used  Substance Use Topics  . Alcohol use: No  . 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     Review of Systems  Constitutional: No fever/chills Eyes: No visual changes. No discharge ENT: No upper respiratory complaints. Cardiovascular: Lower chest pain/epigastric Respiratory: no cough. No SOB. Gastrointestinal: Lower chest/epigastric pain.Marland Kitchen.  Positive nausea, no vomiting.  No diarrhea.  No constipation. Genitourinary: Negative for dysuria. No hematuria Musculoskeletal:  Negative for musculoskeletal pain. Skin: Negative for rash, abrasions, lacerations, ecchymosis. Neurological: Negative for headaches, focal weakness or numbness.  10 System ROS otherwise negative.  ____________________________________________   PHYSICAL EXAM:  VITAL SIGNS: ED Triage Vitals  Enc Vitals Group     BP 09/15/20 1842 (!) 167/81     Pulse Rate 09/15/20 1840 (S) (!) 105     Resp 09/15/20 1840 (!) 24     Temp 09/15/20 1840 99.6 F (37.6 C)     Temp Source 09/15/20 1840 Oral     SpO2 09/15/20 1840 97 %     Weight 09/15/20 1841 142 lb (64.4 kg)     Height 09/15/20 1841 5\' 7"  (1.702 m)     Head Circumference --      Peak Flow --      Pain Score 09/15/20 1840 10     Pain Loc --  Pain Edu? --      Excl. in GC? --      Constitutional: Alert and oriented. Well appearing and in no acute distress. Eyes: Conjunctivae are normal. PERRL. EOMI. Head: Atraumatic. ENT:      Ears:       Nose: No congestion/rhinnorhea.      Mouth/Throat: Mucous membranes are moist.  Neck: No stridor.  Neck is supple full range of motion  Cardiovascular: Normal rate, regular rhythm. Normal S1 and S2.  No appreciable murmurs, rubs, gallops.  Good peripheral circulation. Respiratory: Normal respiratory effort without tachypnea or retractions. Lungs with faint expiratory wheeze diffusely in the lower lung fields.  No inspiratory wheezing.  No crackles or rhonchi.Peri Jefferson air entry to the bases with no decreased or absent breath sounds. Gastrointestinal: Bowel sounds 4 quadrants. Soft and nontender to palpation all quadrants including the epigastric region.. No guarding or rigidity. No palpable masses. No distention. No CVA tenderness. Musculoskeletal: Full range of motion to all extremities. No gross deformities appreciated. Neurologic:  Normal speech and language. No gross focal neurologic deficits are appreciated.  Skin:  Skin is warm, dry and intact. No rash noted. Psychiatric: Mood and affect  are normal. Speech and behavior are normal. Patient exhibits appropriate insight and judgement.   ____________________________________________   LABS (all labs ordered are listed, but only abnormal results are displayed)  Labs Reviewed  BASIC METABOLIC PANEL - Abnormal; Notable for the following components:      Result Value   Glucose, Bld 125 (*)    Calcium 8.6 (*)    All other components within normal limits  CBC - Abnormal; Notable for the following components:   RBC 3.99 (*)    Hemoglobin 12.1 (*)    HCT 38.3 (*)    All other components within normal limits  HEPATIC FUNCTION PANEL - Abnormal; Notable for the following components:   Albumin 3.2 (*)    All other components within normal limits  BRAIN NATRIURETIC PEPTIDE - Abnormal; Notable for the following components:   B Natriuretic Peptide 640.1 (*)    All other components within normal limits  TROPONIN I (HIGH SENSITIVITY) - Abnormal; Notable for the following components:   Troponin I (High Sensitivity) 56 (*)    All other components within normal limits  TROPONIN I (HIGH SENSITIVITY) - Abnormal; Notable for the following components:   Troponin I (High Sensitivity) 62 (*)    All other components within normal limits  SARS CORONAVIRUS 2 (TAT 6-24 HRS)  LIPASE, BLOOD   ____________________________________________  EKG  ED ECG REPORT I, Delorise Royals Thomson Herbers,  personally viewed and interpreted this ECG.   Date: 09/15/2020  EKG Time: 1848 hrs.  Rate: 97 bpm  Rhythm: normal sinus rhythm, occasional PVC noted, unifocal, 1st degree AV block, nonspecific T wave changes in the lateral leads, previous EKG from 06/08/2019 was reviewed.  Compared to previous EKG, no ongoing ST elevation in V1 through V3.  Axis: Normal axis  Intervals:first-degree A-V block   ST&T Change: No ST elevation or depression noted at this time.  Normal sinus rhythm with first-degree heart block.  Occasional PVC.  No ST elevation or depression noted  at this time.  Inverted T waves in lateral leads consistent with previous EKG.  When compared with previous EKG from 06/08/2019 patient with no residual ST elevation.  Patient previously had ongoing ST elevation of V1 through V3.  Elevation is not currently present.  ____________________________________________  RADIOLOGY I personally viewed and evaluated these  images as part of my medical decision making, as well as reviewing the written report by the radiologist.  ED Provider Interpretation: Evidence of previous CABG, however no active cardiopulmonary disease identified  DG Chest 2 View  Result Date: 09/15/2020 CLINICAL DATA:  Chest cramping and shortness of breath. EXAM: CHEST - 2 VIEW COMPARISON:  June 08, 2019 FINDINGS: Multiple sternal wires are seen. Mild, chronic appearing increased interstitial lung markings are present. There is no evidence of acute infiltrate, pleural effusion or pneumothorax. The heart size and mediastinal contours are within normal limits. Moderate severity coronary artery calcification is seen. The visualized skeletal structures are unremarkable. IMPRESSION: 1. Evidence of prior median sternotomy/CABG. 2. No acute or active cardiopulmonary disease. Electronically Signed   By: Aram Candela M.D.   On: 09/15/2020 19:32    ____________________________________________    PROCEDURES  Procedure(s) performed:    Procedures    Medications  albuterol (PROVENTIL) (2.5 MG/3ML) 0.083% nebulizer solution 2.5 mg (2.5 mg Nebulization Given 09/15/20 2027)  methylPREDNISolone sodium succinate (SOLU-MEDROL) 125 mg/2 mL injection 125 mg (125 mg Intravenous Given 09/15/20 2048)     ____________________________________________   INITIAL IMPRESSION / ASSESSMENT AND PLAN / ED COURSE  Pertinent labs & imaging results that were available during my care of the patient were reviewed by me and considered in my medical decision making (see chart for details).  Review of  the [place] CSRS was performed in accordance of the [facility] prior to dispensing any controlled drugs.  Clinical Course as of 09/15/20 2142  Wed Sep 15, 2020  1931 Patient presented to the emergency department with resolving lower chest/epigastric pain.  Patient states that he woke this morning with symptoms.  Throughout the day symptoms have been improving.  No radiation of pain.  He did have some nausea but no emesis.  No radiation of the pain to the back, neck or arm.  History of coronary artery disease, MI, CHF, hypertension, COPD.  Patient currently describes his pain as a 0 out of 10.  EKG revealed normal sinus rhythm with occasional PVCs and first-degree heart block.  No evidence of STEMI.  Differential includes ACS/STEMI, GERD, COPD flare, CHF exacerbation, pancreatitis, cholecystitis. [JC]    Clinical Course User Index [JC] Oza Oberle, Delorise Royals, PA-C          Patient's diagnosis is consistent with nonspecific chest pain, elevated troponin, COPD exacerbation.  Patient presented to emergency department complaining of chest pain that began this morning.  He states that the pain earlier was sharp, scribed in his lower chest with no radiation.  He does have a history of coronary artery disease, CABG, previous MI.  Patient was slightly poor historian but it appears the patient has also been suffering from some shortness of breath today.  There was no reported URI symptoms.  Pain had been improving and patient now describes the chest pain as tightness.  He describes it as a 0 on the pain scale but very "tight."  Patient did have some wheezing and does have a history of COPD.  He states that he has been using his albuterol throughout the day with no improvement of his symptoms.  This time I suspect the patient has a COPD exacerbation with a bump in the patient's troponin.  EKG actually appears somewhat improved from his previous EKG.  I do not suspect an acute STEMI/NSTEMI at this time.  At this time given the  patient's history, elevation from his baseline troponin, COPD exacerbation findings I feel the  patient would best be suited with admission.  Patient will be admitted to the hospital service at this time..    Addendum: Patient presents emergency department complaining of chest pain starting this morning.  Patient was to be admitted to the hospital service.  Hospitalist while talking with the patient was informed by the patient that he did not want to stay to be admitted.  Patient is aware of the consequences that could follow with refusing admission.  Patient mains adamant that he would like to go home.  Patient is aware that he may return to the emergency department for treatment and reevaluation if any of the symptoms worsen.  He verbalizes understanding of same.  Patient is aware that he is leaving AGAINST MEDICAL ADVICE.  ____________________________________________  FINAL CLINICAL IMPRESSION(S) / ED DIAGNOSES  Final diagnoses:  Nonspecific chest pain  Elevated troponin  Chronic obstructive pulmonary disease with acute exacerbation (HCC)      NEW MEDICATIONS STARTED DURING THIS VISIT:  ED Discharge Orders    None          This chart was dictated using voice recognition software/Dragon. Despite best efforts to proofread, errors can occur which can change the meaning. Any change was purely unintentional.    Racheal Patches, PA-C 09/15/20 2142    Zuri Bradway, Delorise Royals, PA-C 09/15/20 2217    Delton Prairie, MD 09/15/20 2218

## 2020-09-15 NOTE — ED Notes (Signed)
AMA form signed by patient and placed in pt's chart.

## 2020-09-15 NOTE — ED Triage Notes (Signed)
Pt ambulatory to triage. Pt has cramping in chest and sob.  Pt reports chills.  cig smoker.  No cough.  No n/v/d.  Pt alert  Speech clear.

## 2020-09-16 LAB — SARS CORONAVIRUS 2 (TAT 6-24 HRS): SARS Coronavirus 2: NEGATIVE

## 2020-09-27 NOTE — Progress Notes (Deleted)
Cardiology Office Note  Date:  09/27/2020   ID:  Blake [name], DOB 1956/03/30, MRN [id number]  PCP:  Tarri Fuller, FNP   No chief complaint on file.   HPI:  [name] is a 65 year old gentleman with long history of  smoking who continues to smoke at least one pack per day,  hypertension,  Hepatitis, schizophrenia, coronary artery disease, cardiac catheterization October 2009 showing ejection fraction 30%, severe anterolateral and severe apical hypokinesis, three-vessel coronary artery disease, bypass surgery after MI in 2012, shortness of breath on previous clinic visits, Notes indicate he was in prison for 2 years and had no medications, May 2015 got out of prison. He had cardiac catheterization for chest tightness in 2016, this showed occluded vein graft to the OM and PDA He presents today for follow-up of his coronary artery disease  Last seen in the office by one of our providers June 09, 2019 Last seen by myself in 2017    Wife reports that he tries to stay on his medications, refills recently by primary care (seen earlier today) Denies any significant chest pain but does have his chronic shortness of breath on exertion. He continues to smoke one pack per day, not interested in quitting Reports the patches do not work for him Leads a busy lifestyle, fixes motors, works as a Curator Denies any lightheadedness, dizziness, orthostasis, leg edema  EKG on today's visit shows normal sinus rhythm with rate 69 bpm, diffuse T-wave abnormality  Other past medical history He reports that he was doing well following bypass surgery in 2012. Beginning of 2016 he developed worsening shortness of breath. He has had some lower extremity edema.  He does report having occasional chest pain, takes nitroglycerin when necessary.   Recent blood work reviewed with him showing total cholesterol 126, LDL 82, HDL 33 Normal creatinine 0.92, normal LFTs, PSA 0.7 Hepatitis  C virus antibody positive, hepatitis B core antibody positive,   PMH:   has a past medical history of Acute headache, CHF (congestive heart failure) (HCC), COPD (chronic obstructive pulmonary disease) (HCC), Coronary artery disease, Depression, Hepatitis B, Hepatitis C, HFrEF (heart failure with reduced ejection fraction) (HCC), Hyperlipidemia, Hypertension, MI (myocardial infarction) (HCC), and Tobacco abuse.  PSH:    Past Surgical History:  Procedure Laterality Date  . CARDIAC CATHETERIZATION    . CORONARY ANGIOPLASTY WITH STENT PLACEMENT  2009  . CORONARY ARTERY BYPASS GRAFT  2012   [facility]  . KNEE SURGERY     right knee   . LEFT HEART CATH AND CORS/GRAFTS ANGIOGRAPHY N/A 06/09/2019   Procedure: LEFT HEART CATH AND CORS/GRAFTS ANGIOGRAPHY;  Surgeon: Iran Ouch, MD;  Location: [facility] INVASIVE CV LAB;  Service: Cardiovascular;  Laterality: N/A;    Current Outpatient Medications  Medication Sig Dispense Refill  . albuterol (VENTOLIN HFA) 108 (90 Base) MCG/ACT inhaler Inhale 2 puffs into the lungs every 6 (six) hours as needed for wheezing or shortness of breath. 18 g 0  . aspirin 81 MG chewable tablet Chew 1 tablet (81 mg total) by mouth daily. 30 tablet 11  . atorvastatin (LIPITOR) 40 MG tablet Take 1 tablet (40 mg total) by mouth daily at 6 PM. 30 tablet 0  . Fluticasone-Salmeterol (ADVAIR) 250-50 MCG/DOSE AEPB Inhale 1 puff into the lungs 2 (two) times daily. 60 each 0  . furosemide (LASIX) 20 MG tablet Take 1 tablet (20 mg total) by mouth daily as needed. 30 tablet 0  . loratadine (CLARITIN) 10 MG tablet Take  1 tablet (10 mg total) by mouth daily. 30 tablet 0  . metoprolol succinate (TOPROL-XL) 25 MG 24 hr tablet Take 1 tablet (25 mg total) by mouth daily. 30 tablet 0  . nitroGLYCERIN (NITROSTAT) 0.4 MG SL tablet Place 1 tablet (0.4 mg total) under the tongue every 5 (five) minutes as needed for chest pain. Reported on 09/29/2015 10 tablet 0  . umeclidinium bromide (INCRUSE ELLIPTA)  62.5 MCG/INH AEPB Inhale 1 puff into the lungs daily. 30 each 1   No current facility-administered medications for this visit.     Allergies:   Patient has no known allergies.   Social History:  The patient  reports that he has been smoking cigarettes. He has a 63.00 pack-year smoking history. He has never used smokeless tobacco. He reports previous drug use. Drug: Heroin. He reports that he does not drink alcohol.   Family History:   family history includes Hypertension in his father, mother, sister, and sister.    Review of Systems: ROS   PHYSICAL EXAM: VS:  There were no vitals taken for this visit. , BMI There is no height or weight on file to calculate BMI. GEN: Well nourished, well developed, in no acute distress HEENT: normal Neck: no JVD, carotid bruits, or masses Cardiac: RRR; no murmurs, rubs, or gallops,no edema  Respiratory:  clear to auscultation bilaterally, normal work of breathing GI: soft, nontender, nondistended, + BS MS: no deformity or atrophy Skin: warm and dry, no rash Neuro:  Strength and sensation are intact Psych: euthymic mood, full affect    Recent Labs: 09/15/2020: ALT 13; B Natriuretic Peptide 640.1; BUN 17; Creatinine, Ser 1.12; Hemoglobin 12.1; Platelets 213; Potassium 4.2; Sodium 136    Lipid Panel Lab Results  Component Value Date   CHOL 144 06/08/2019   HDL 38 (L) 06/08/2019   LDLCALC 96 06/08/2019   TRIG 48 06/08/2019      Wt Readings from Last 3 Encounters:  09/15/20 142 lb (64.4 kg)  06/09/19 152 lb 12.5 oz (69.3 kg)  09/04/18 162 lb (73.5 kg)       ASSESSMENT AND PLAN:  Problem List Items Addressed This Visit   None      Disposition:   F/U  12 months   Total encounter time more than 25 minutes  Greater than 50% was spent in counseling and coordination of care with the patient    Signed, Dossie Arbour, M.D., Ph.D. [facility] [place], [place] [phone]

## 2020-09-28 ENCOUNTER — Ambulatory Visit: Payer: Medicare Other | Admitting: Cardiovascular Disease

## 2020-09-30 ENCOUNTER — Encounter: Payer: Self-pay | Admitting: Cardiovascular Disease

## 2020-10-18 ENCOUNTER — Ambulatory Visit: Payer: Medicare Other | Admitting: Cardiology

## 2020-10-20 ENCOUNTER — Encounter: Payer: Self-pay | Admitting: Cardiology

## 2020-12-14 ENCOUNTER — Ambulatory Visit: Payer: Medicare Other | Admitting: Nurse Practitioner

## 2020-12-29 ENCOUNTER — Ambulatory Visit: Payer: Medicare Other | Admitting: Nurse Practitioner

## 2020-12-29 NOTE — Progress Notes (Deleted)
   There were no vitals taken for this visit.   Subjective:    Patient ID: Blake [name], male    DOB: 1956-06-10, 65 y.o.   MRN: [id number]  HPI: Blake [name] is a 65 y.o. male  No chief complaint on file.   Relevant past medical, surgical, family and social history reviewed and updated as indicated. Interim medical history since our last visit reviewed. Allergies and medications reviewed and updated.  Review of Systems  Per HPI unless specifically indicated above     Objective:    There were no vitals taken for this visit.  Wt Readings from Last 3 Encounters:  09/15/20 142 lb (64.4 kg)  06/09/19 152 lb 12.5 oz (69.3 kg)  09/04/18 162 lb (73.5 kg)    Physical Exam  Results for orders placed or performed during the hospital encounter of 09/15/20  SARS CORONAVIRUS 2 (TAT 6-24 HRS) Nasopharyngeal Nasopharyngeal Swab   Specimen: Nasopharyngeal Swab  Result Value Ref Range   SARS Coronavirus 2 NEGATIVE NEGATIVE  Basic metabolic panel  Result Value Ref Range   Sodium 136 135 - 145 mmol/L   Potassium 4.2 3.5 - 5.1 mmol/L   Chloride 101 98 - 111 mmol/L   CO2 27 22 - 32 mmol/L   Glucose, Bld 125 (H) 70 - 99 mg/dL   BUN 17 8 - 23 mg/dL   Creatinine, Ser 2.75 0.61 - 1.24 mg/dL   Calcium 8.6 (L) 8.9 - 10.3 mg/dL   GFR, Estimated >17 >00 mL/min   Anion gap 8 5 - 15  CBC  Result Value Ref Range   WBC 8.1 4.0 - 10.5 K/uL   RBC 3.99 (L) 4.22 - 5.81 MIL/uL   Hemoglobin 12.1 (L) 13.0 - 17.0 g/dL   HCT 17.4 (L) 94.4 - 96.7 %   MCV 96.0 80.0 - 100.0 fL   MCH 30.3 26.0 - 34.0 pg   MCHC 31.6 30.0 - 36.0 g/dL   RDW 59.1 63.8 - 46.6 %   Platelets 213 150 - 400 K/uL   nRBC 0.0 0.0 - 0.2 %  Hepatic function panel  Result Value Ref Range   Total Protein 7.5 6.5 - 8.1 g/dL   Albumin 3.2 (L) 3.5 - 5.0 g/dL   AST 29 15 - 41 U/L   ALT 13 0 - 44 U/L   Alkaline Phosphatase 93 38 - 126 U/L   Total Bilirubin 1.0 0.3 - 1.2 mg/dL   Bilirubin, Direct 0.2 0.0 - 0.2 mg/dL    Indirect Bilirubin 0.8 0.3 - 0.9 mg/dL  Lipase, blood  Result Value Ref Range   Lipase 20 11 - 51 U/L  Brain natriuretic peptide  Result Value Ref Range   B Natriuretic Peptide 640.1 (H) 0.0 - 100.0 pg/mL  Troponin I (High Sensitivity)  Result Value Ref Range   Troponin I (High Sensitivity) 56 (H) <18 ng/L  Troponin I (High Sensitivity)  Result Value Ref Range   Troponin I (High Sensitivity) 62 (H) <18 ng/L      Assessment & Plan:   Problem List Items Addressed This Visit   None    Follow up plan: No follow-ups on file.

## 2021-01-29 ENCOUNTER — Emergency Department: Payer: Medicare Other

## 2021-01-29 ENCOUNTER — Other Ambulatory Visit: Payer: Self-pay

## 2021-01-29 DIAGNOSIS — S5001XA Contusion of right elbow, initial encounter: Secondary | ICD-10-CM | POA: Insufficient documentation

## 2021-01-29 DIAGNOSIS — S8011XA Contusion of right lower leg, initial encounter: Secondary | ICD-10-CM | POA: Insufficient documentation

## 2021-01-29 DIAGNOSIS — I5022 Chronic systolic (congestive) heart failure: Secondary | ICD-10-CM | POA: Insufficient documentation

## 2021-01-29 DIAGNOSIS — J449 Chronic obstructive pulmonary disease, unspecified: Secondary | ICD-10-CM | POA: Insufficient documentation

## 2021-01-29 DIAGNOSIS — I110 Hypertensive heart disease with heart failure: Secondary | ICD-10-CM | POA: Diagnosis not present

## 2021-01-29 DIAGNOSIS — F0390 Unspecified dementia without behavioral disturbance: Secondary | ICD-10-CM | POA: Insufficient documentation

## 2021-01-29 DIAGNOSIS — Y9355 Activity, bike riding: Secondary | ICD-10-CM | POA: Diagnosis not present

## 2021-01-29 DIAGNOSIS — Z79899 Other long term (current) drug therapy: Secondary | ICD-10-CM | POA: Insufficient documentation

## 2021-01-29 DIAGNOSIS — S60211A Contusion of right wrist, initial encounter: Secondary | ICD-10-CM | POA: Diagnosis not present

## 2021-01-29 DIAGNOSIS — F17210 Nicotine dependence, cigarettes, uncomplicated: Secondary | ICD-10-CM | POA: Diagnosis not present

## 2021-01-29 DIAGNOSIS — S60511A Abrasion of right hand, initial encounter: Secondary | ICD-10-CM | POA: Insufficient documentation

## 2021-01-29 DIAGNOSIS — Z951 Presence of aortocoronary bypass graft: Secondary | ICD-10-CM | POA: Insufficient documentation

## 2021-01-29 DIAGNOSIS — Z7982 Long term (current) use of aspirin: Secondary | ICD-10-CM | POA: Insufficient documentation

## 2021-01-29 DIAGNOSIS — S50811A Abrasion of right forearm, initial encounter: Secondary | ICD-10-CM | POA: Diagnosis not present

## 2021-01-29 DIAGNOSIS — Y92410 Unspecified street and highway as the place of occurrence of the external cause: Secondary | ICD-10-CM | POA: Diagnosis not present

## 2021-01-29 DIAGNOSIS — T1490XA Injury, unspecified, initial encounter: Secondary | ICD-10-CM

## 2021-01-29 DIAGNOSIS — I25118 Atherosclerotic heart disease of native coronary artery with other forms of angina pectoris: Secondary | ICD-10-CM | POA: Insufficient documentation

## 2021-01-29 MED ORDER — IOHEXOL 300 MG/ML  SOLN
75.0000 mL | Freq: Once | INTRAMUSCULAR | Status: AC | PRN
  Administered 2021-01-29: 75 mL via INTRAVENOUS
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
## 2021-01-29 NOTE — ED Notes (Signed)
Spoke with dr. Derrill Kay regardingmoi, orders being placed by md.

## 2021-01-30 ENCOUNTER — Emergency Department
Admission: EM | Admit: 2021-01-30 | Discharge: 2021-01-30 | Disposition: A | Discharge status: Home / Self Care | Payer: Medicare Other | Attending: Emergency Medicine | Admitting: Emergency Medicine

## 2021-01-30 ENCOUNTER — Emergency Department: Payer: Medicare Other

## 2021-01-30 DIAGNOSIS — T148XXA Other injury of unspecified body region, initial encounter: Secondary | ICD-10-CM

## 2021-01-30 DIAGNOSIS — T1490XA Injury, unspecified, initial encounter: Secondary | ICD-10-CM

## 2021-01-30 DIAGNOSIS — T07XXXA Unspecified multiple injuries, initial encounter: Secondary | ICD-10-CM

## 2021-01-30 DIAGNOSIS — S5001XA Contusion of right elbow, initial encounter: Secondary | ICD-10-CM | POA: Diagnosis not present

## 2021-01-30 MED ORDER — HYDROCODONE-ACETAMINOPHEN 5-325 MG PO TABS: 2.0000 | ORAL_TABLET | Freq: Four times a day (QID) | ORAL | 0 refills | Status: DC | PRN

## 2021-01-30 MED ORDER — ACETAMINOPHEN 500 MG PO TABS: 1000.0000 mg | ORAL_TABLET | Freq: Once | ORAL | Status: DC

## 2021-01-30 MED ORDER — IBUPROFEN 400 MG PO TABS: 400.0000 mg | ORAL_TABLET | Freq: Once | ORAL | Status: DC

## 2021-01-30 MED ORDER — OXYCODONE-ACETAMINOPHEN 5-325 MG PO TABS
1.0000 | ORAL_TABLET | Freq: Once | ORAL | Status: AC
  Administered 2021-01-30: 1 via ORAL
  Filled 2021-01-30: qty 1
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
## 2021-03-03 ENCOUNTER — Other Ambulatory Visit: Payer: Self-pay

## 2021-03-03 ENCOUNTER — Emergency Department: Payer: Medicare Other

## 2021-03-03 ENCOUNTER — Emergency Department
Admission: EM | Admit: 2021-03-03 | Discharge: 2021-03-03 | Disposition: A | Discharge status: Home / Self Care | Payer: Medicare Other | Attending: Student in an Organized Health Care Education/Training Program | Admitting: Student in an Organized Health Care Education/Training Program

## 2021-03-03 DIAGNOSIS — Z7982 Long term (current) use of aspirin: Secondary | ICD-10-CM | POA: Diagnosis not present

## 2021-03-03 DIAGNOSIS — M79604 Pain in right leg: Secondary | ICD-10-CM

## 2021-03-03 DIAGNOSIS — I509 Heart failure, unspecified: Secondary | ICD-10-CM | POA: Insufficient documentation

## 2021-03-03 DIAGNOSIS — M25461 Effusion, right knee: Secondary | ICD-10-CM | POA: Insufficient documentation

## 2021-03-03 DIAGNOSIS — F17210 Nicotine dependence, cigarettes, uncomplicated: Secondary | ICD-10-CM | POA: Insufficient documentation

## 2021-03-03 DIAGNOSIS — J449 Chronic obstructive pulmonary disease, unspecified: Secondary | ICD-10-CM | POA: Diagnosis not present

## 2021-03-03 DIAGNOSIS — Z79899 Other long term (current) drug therapy: Secondary | ICD-10-CM | POA: Diagnosis not present

## 2021-03-03 DIAGNOSIS — Z951 Presence of aortocoronary bypass graft: Secondary | ICD-10-CM | POA: Diagnosis not present

## 2021-03-03 DIAGNOSIS — I110 Hypertensive heart disease with heart failure: Secondary | ICD-10-CM | POA: Insufficient documentation

## 2021-03-03 MED ORDER — MELOXICAM 7.5 MG PO TABS
7.5000 mg | ORAL_TABLET | Freq: Once | ORAL | Status: AC
  Administered 2021-03-03: 7.5 mg via ORAL
  Filled 2021-03-03: qty 1

## 2021-03-03 MED ORDER — MELOXICAM 7.5 MG PO TABS: 7.5000 mg | ORAL_TABLET | Freq: Every day | ORAL | 0 refills | Status: AC

## 2021-03-03 NOTE — Discharge Instructions (Addendum)
Please wear the brace as you were given today.  You have been prescribed Mobic, an anti-inflammatory to take once daily for the next 2 weeks.  You may also take Tylenol, up to 1000 mg 4 times daily as needed.  Please follow-up with orthopedics as above.  Return to the emergency department with any worsening.
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
## 2021-04-04 ENCOUNTER — Encounter (HOSPITAL_COMMUNITY): Payer: Self-pay | Admitting: Radiology

## 2021-04-15 DIAGNOSIS — F79 Unspecified intellectual disabilities: Secondary | ICD-10-CM | POA: Insufficient documentation

## 2021-05-11 ENCOUNTER — Other Ambulatory Visit
Admission: RE | Admit: 2021-05-11 | Discharge: 2021-05-11 | Disposition: A | Discharge status: Home / Self Care | Payer: Medicare Other | Source: Ambulatory Visit | Attending: Internal Medicine | Admitting: Internal Medicine

## 2021-05-11 DIAGNOSIS — M7989 Other specified soft tissue disorders: Secondary | ICD-10-CM | POA: Insufficient documentation

## 2021-05-11 DIAGNOSIS — M79604 Pain in right leg: Secondary | ICD-10-CM | POA: Diagnosis present

## 2021-05-11 LAB — D-DIMER, QUANTITATIVE: D-Dimer, Quant: 0.58 ug/mL-FEU — ABNORMAL HIGH (ref 0.00–0.50)

## 2021-05-16 ENCOUNTER — Emergency Department
Admission: EM | Admit: 2021-05-16 | Discharge: 2021-05-16 | Disposition: A | Discharge status: Home / Self Care | Payer: Medicare Other | Attending: Emergency Medicine | Admitting: Emergency Medicine

## 2021-05-16 ENCOUNTER — Emergency Department: Payer: Medicare Other

## 2021-05-16 ENCOUNTER — Other Ambulatory Visit: Payer: Self-pay

## 2021-05-16 DIAGNOSIS — F17210 Nicotine dependence, cigarettes, uncomplicated: Secondary | ICD-10-CM | POA: Insufficient documentation

## 2021-05-16 DIAGNOSIS — R0602 Shortness of breath: Secondary | ICD-10-CM | POA: Diagnosis present

## 2021-05-16 DIAGNOSIS — Z7982 Long term (current) use of aspirin: Secondary | ICD-10-CM | POA: Insufficient documentation

## 2021-05-16 DIAGNOSIS — I110 Hypertensive heart disease with heart failure: Secondary | ICD-10-CM | POA: Insufficient documentation

## 2021-05-16 DIAGNOSIS — Z20822 Contact with and (suspected) exposure to covid-19: Secondary | ICD-10-CM | POA: Diagnosis not present

## 2021-05-16 DIAGNOSIS — I2510 Atherosclerotic heart disease of native coronary artery without angina pectoris: Secondary | ICD-10-CM | POA: Insufficient documentation

## 2021-05-16 DIAGNOSIS — I5023 Acute on chronic systolic (congestive) heart failure: Secondary | ICD-10-CM | POA: Diagnosis not present

## 2021-05-16 DIAGNOSIS — I509 Heart failure, unspecified: Secondary | ICD-10-CM

## 2021-05-16 DIAGNOSIS — J449 Chronic obstructive pulmonary disease, unspecified: Secondary | ICD-10-CM | POA: Diagnosis not present

## 2021-05-16 LAB — CBC
HCT: 37.9 % — ABNORMAL LOW (ref 39.0–52.0)
Hemoglobin: 11.6 g/dL — ABNORMAL LOW (ref 13.0–17.0)
MCH: 30.4 pg (ref 26.0–34.0)
MCHC: 30.6 g/dL (ref 30.0–36.0)
MCV: 99.2 fL (ref 80.0–100.0)
Platelets: 201 10*3/uL (ref 150–400)
RBC: 3.82 MIL/uL — ABNORMAL LOW (ref 4.22–5.81)
RDW: 13.1 % (ref 11.5–15.5)
WBC: 5 10*3/uL (ref 4.0–10.5)
nRBC: 0 % (ref 0.0–0.2)

## 2021-05-16 LAB — BASIC METABOLIC PANEL
Anion gap: 5 (ref 5–15)
BUN: 18 mg/dL (ref 8–23)
CO2: 30 mmol/L (ref 22–32)
Calcium: 8.9 mg/dL (ref 8.9–10.3)
Chloride: 105 mmol/L (ref 98–111)
Creatinine, Ser: 0.95 mg/dL (ref 0.61–1.24)
GFR, Estimated: >60 mL/min (ref >60)
Glucose, Bld: 104 mg/dL — ABNORMAL HIGH (ref 70–99)
Potassium: 4 mmol/L (ref 3.5–5.1)
Sodium: 140 mmol/L (ref 135–145)

## 2021-05-16 LAB — RESP PANEL BY RT-PCR (FLU A&B, COVID) ARPGX2
Influenza A by PCR: NEGATIVE
Influenza B by PCR: NEGATIVE
SARS Coronavirus 2 by RT PCR: NEGATIVE

## 2021-05-16 LAB — TROPONIN I (HIGH SENSITIVITY)
Troponin I (High Sensitivity): 23 ng/L — ABNORMAL HIGH (ref <18)
Troponin I (High Sensitivity): 25 ng/L — ABNORMAL HIGH (ref <18)

## 2021-05-16 LAB — BRAIN NATRIURETIC PEPTIDE: B Natriuretic Peptide: 730.6 pg/mL — ABNORMAL HIGH (ref 0.0–100.0)

## 2021-05-16 MED ORDER — FUROSEMIDE 20 MG PO TABS: 20.0000 mg | ORAL_TABLET | Freq: Every day | ORAL | 0 refills | Status: DC

## 2021-05-16 MED ORDER — FUROSEMIDE 10 MG/ML IJ SOLN
40.0000 mg | Freq: Once | INTRAMUSCULAR | Status: AC
  Administered 2021-05-16: 40 mg via INTRAVENOUS
  Filled 2021-05-16: qty 4

## 2021-05-16 NOTE — ED Triage Notes (Signed)
Pt to ED for shob for 4 days. Intermittent cp.  +dizziness

## 2021-05-16 NOTE — ED Provider Notes (Signed)
[facility] Emergency Department Provider Note  ____________________________________________   Event Date/Time   First MD Initiated Contact with Patient 05/16/21 1608     (approximate)  I have reviewed the triage vital signs and the nursing notes.   HISTORY  Chief Complaint Shortness of Breath    HPI Blake [name] is a 65 y.o. male with CHF, prior MIs, known coronary disease who comes in with concerns for worsening shortness of breath.  Patient reports shortness of breath over the past 2 days, constant, nothing makes it better, worse with exertion.  Discussed with patient's sister who helps to do all of his medications and has not been on Lasix for very long time.  Also reports some swelling in his legs, right greater than left.  Denies having this previously.  Did have prior injury to his right leg.  I also talked to patient's sister who helps his medications who does confirm that he is now been taking Lasix.          Past Medical History:  Diagnosis Date   Acute headache    CHF (congestive heart failure) (HCC)    COPD (chronic obstructive pulmonary disease) (HCC)    Coronary artery disease    a. cath 2009 w/ severe 3-vessel dz, rec bypass; b. MI 2012-->4-vessel cabg; c. cath 2016: occluded VG-OM and VG-RPDA, patent LIMA-LAD and patent VG-Diag, 95% stenosis of mid LCx s/p PCI/DES 0% residual stenosis   Depression    Hepatitis B    Hepatitis C    HFrEF (heart failure with reduced ejection fraction) (HCC)    a. 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   H/O acute myocardial infarction 09/29/2015   Alveolar aeration decreased 09/29/2015   Special screening for malignant neoplasms, colon 09/29/2015   Chest pain due to CAD (HCC) 09/16/2014   SOB (shortness of breath) 09/16/2014   Hepatitis 09/16/2014   S/P CABG (coronary artery bypass graft) 09/16/2014   Coronary artery disease involving native coronary artery of native heart with other form of angina pectoris (HCC) 09/16/2014   Shortness of breath 09/16/2014   Smoker 09/16/2014   Emphysema of lung (HCC) 09/16/2014   Chronic systolic CHF (congestive heart failure) (HCC) 09/16/2014    Past Surgical History:  Procedure Laterality Date   CARDIAC CATHETERIZATION     CORONARY ANGIOPLASTY WITH STENT PLACEMENT  2009   CORONARY ARTERY BYPASS GRAFT  2012   [facility]   KNEE SURGERY     right knee    LEFT HEART CATH AND CORS/GRAFTS ANGIOGRAPHY N/A 06/09/2019   Procedure: LEFT HEART CATH AND CORS/GRAFTS ANGIOGRAPHY;  Surgeon: Iran Ouch, MD;  Location: [facility] INVASIVE CV LAB;  Service: Cardiovascular;  Laterality: N/A;    Prior to Admission medications   Medication Sig Start Date End Date Taking? Authorizing Provider  albuterol (VENTOLIN HFA) 108 (90 Base) MCG/ACT inhaler Inhale  2 puffs into the lungs every 6 (six) hours as needed for wheezing or shortness of breath. 12/29/19   Malfi, Jodelle Gross, FNP  aspirin 81 MG chewable tablet Chew 1 tablet (81 mg total) by mouth daily. 09/04/18   Galen Manila, NP  atorvastatin (LIPITOR) 40 MG tablet Take 1 tablet (40 mg total) by mouth daily at 6 PM. 12/29/19   Malfi, Jodelle Gross, FNP  Fluticasone-Salmeterol (ADVAIR) 250-50 MCG/DOSE AEPB Inhale 1 puff into the lungs 2 (two) times daily. 12/29/19   Malfi, Jodelle Gross, FNP  furosemide (LASIX) 20 MG tablet Take 1 tablet (20 mg total)  by mouth daily as needed. 12/29/19   Malfi, Jodelle Gross, FNP  HYDROcodone-acetaminophen (NORCO) 5-325 MG tablet Take 2 tablets by mouth every 6 (six) hours as needed for moderate pain. 01/30/21   Gilles Chiquito, MD  loratadine (CLARITIN) 10 MG tablet Take 1 tablet (10 mg total) by mouth daily. 12/29/19   Malfi, Jodelle Gross, FNP  metoprolol succinate (TOPROL-XL) 25 MG 24 hr tablet Take 1 tablet (25 mg total) by mouth daily. 12/29/19   Malfi, Jodelle Gross, FNP  nitroGLYCERIN (NITROSTAT) 0.4 MG SL tablet Place 1 tablet (0.4 mg total) under the tongue every 5 (five) minutes as needed for chest pain. Reported on 09/29/2015 12/29/19   Tarri Fuller, FNP  umeclidinium bromide (INCRUSE ELLIPTA) 62.5 MCG/INH AEPB Inhale 1 puff into the lungs daily. 01/06/20   Malfi, Jodelle Gross, FNP    Allergies Patient has no known allergies.  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 ENT: No sore throat. Cardiovascular: No chest pain Respiratory: Positive for SOB Gastrointestinal: No abdominal pain.  No nausea, no vomiting.  No diarrhea.  No constipation. Genitourinary: Negative for dysuria. Musculoskeletal: Negative for back pain positive leg swelling Skin: Negative for rash. Neurological: Negative for headaches, focal weakness or numbness. All other ROS negative ____________________________________________   PHYSICAL EXAM:  VITAL SIGNS: ED Triage Vitals  Enc Vitals Group     BP 05/16/21 1304 (!) 173/90     Pulse Rate 05/16/21  1304 68     Resp 05/16/21 1304 18     Temp 05/16/21 1304 98.4 F (36.9 C)     Temp Source 05/16/21 1304 Oral     SpO2 05/16/21 1304 96 %     Weight 05/16/21 1406 180 lb (81.6 kg)     Height 05/16/21 1406 5\' 7"  (1.702 m)     Head Circumference --      Peak Flow --      Pain Score 05/16/21 1406 0     Pain Loc --      Pain Edu? --      Excl. in GC? --     Constitutional: Alert and oriented. Well appearing and in no acute distress. Eyes: Conjunctivae are normal. EOMI. Head: Atraumatic. Nose: No congestion/rhinnorhea. Mouth/Throat: Mucous membranes  are moist.   Neck: No stridor. Trachea Midline. FROM Cardiovascular: Normal rate, regular rhythm. Grossly normal heart sounds.  Good peripheral circulation. Respiratory: Mild increased work of breathing, crackles Gastrointestinal: Soft and nontender. No distention. No abdominal bruits.  Musculoskeletal: 1+ edema bilaterally but right leg is slightly enlarged compared to the left leg no joint effusions. Neurologic:  Normal speech and language. No gross focal neurologic deficits are appreciated.  Skin:  Skin is warm, dry and intact. No rash noted. Psychiatric: Mood and affect are normal. Speech and behavior are normal. GU: Deferred   ____________________________________________   LABS (all labs ordered are listed, but only abnormal results are displayed)  Labs Reviewed  BASIC METABOLIC PANEL - Abnormal; Notable for the following components:      Result Value   Glucose, Bld 104 (*)    All other components within normal limits  CBC - Abnormal; Notable for the following components:   RBC 3.82 (*)    Hemoglobin 11.6 (*)    HCT 37.9 (*)    All other components within normal limits  TROPONIN I (HIGH SENSITIVITY) - Abnormal; Notable for the following components:   Troponin I (High Sensitivity) 23 (*)    All other components within normal limits  RESP PANEL BY RT-PCR (FLU A&B, COVID) ARPGX2  BRAIN NATRIURETIC PEPTIDE  TROPONIN I (HIGH  SENSITIVITY)   ____________________________________________   ED ECG REPORT I, Concha Se, the attending physician, personally viewed and interpreted this ECG.   Sinus rate of 68, no ST elevations, T wave inversions although very difficult to interpret due to a lot of artifact may be a type I AV block.  We will have the nurse repeated   EKG is sinus rate of 75, no ST elevation, currently in bigeminy, some T wave inversions in 2 V5 and V6, normal intervals     ____________________________________________  RADIOLOGY I, Concha Se, personally viewed and evaluated these images (plain radiographs) as part of my medical decision making, as well as reviewing the written report by the radiologist.  ED MD interpretation: Pulmonary edema noted  Official radiology report(s): DG Chest 2 View  Result Date: 05/16/2021 CLINICAL DATA:  Shortness of breath for 4 days, intermittent chest pain and dizziness. History CHF, COPD, coronary artery disease post MI, hypertension, smoker EXAM: CHEST - 2 VIEW COMPARISON:  09/15/2020 FINDINGS: Upper normal heart size post CABG and coronary stenting. Mediastinal contours and pulmonary vascularity normal. Atherosclerotic calcification aorta. Increased interstitial markings in the mid to lower lungs bilaterally, minimally increased, likely minimal pulmonary edema, with scattered Kerley B-lines noted. No segmental consolidation, pleural effusion, or pneumothorax. Osseous structures unremarkable. IMPRESSION: Increased interstitial markings in the mid to lower lungs with Kerley B-lines increased from previous exam, likely minimal pulmonary edema. Aortic Atherosclerosis (ICD10-I70.0). Electronically Signed   By: Ulyses Southward M.D.   On: 05/16/2021 15:07    ____________________________________________   PROCEDURES  Procedure(s) performed (including Critical Care):  .1-3 Lead EKG Interpretation Performed by: Concha Se, MD Authorized by: Concha Se, MD      Interpretation: abnormal     ECG rate:  75   ECG rate assessment: normal     Rhythm: sinus rhythm     Ectopy: PVCs   Comments:     Occasional bigeminy   ____________________________________________   INITIAL IMPRESSION / ASSESSMENT AND PLAN / ED COURSE   Blake [name] was evaluated in Emergency Department on 05/16/2021 for the symptoms described in the history of present illness. He was  evaluated in the context of the global COVID-19 pandemic, which necessitated consideration that the patient might be at risk for infection with the SARS-CoV-2 virus that causes COVID-19. Institutional protocols and algorithms that pertain to the evaluation of patients at risk for COVID-19 are in a state of rapid change based on information released by regulatory bodies including the CDC and federal and state organizations. These policies and algorithms were followed during the patient's care in the ED.     Pt presents with SOB.  Suspect most likely secondary to CHF given the leg swelling.  We will also get ultrasounds ordered to make sure no evidence of DVT differential includes: PNA-will get xray to evaluation Anemia-CBC to evaluate ACS- will get trops Arrhythmia-Will get EKG and keep on monitor.  COVID- will get testing per algorithm. PE-lower suspicion given no risk factors and other cause more likely Unless leg ultrasounds are positive then consider further work-up  COVID, flu are negative.  Troponin slightly elevated but similar to prior most likely demand from his CHF.  His BNP is elevated and his chest x-ray is concerning for pulmonary edema.  His potassium level is normal.  Patient was given a dose of IV Lasix.  Patient was ambulatory with oxygen levels above 94% without any increased work of breathing.  I went to reevaluate patient he states that he is feeling much better.  Patient has gone in and out of some bigeminy on the monitor but is asymptomatic from it.  We discussed admission for IV  Lasix versus going home with a dose of oral Lasix and CHF referral clinic.  Patient states that he would like to go home since he is feeling a lot better.  We will start off with 20 mg of Lasix given that is what he was on previously and have him follow-up with the CHF clinic.  He will need to follow-up for a BMP recheck and he understands that if symptoms are worsening at all that he is return to the ER for re-evaluation.   I discussed the provisional nature of ED diagnosis, the treatment so far, the ongoing plan of care, follow up appointments and return precautions with the patient and any family or support people present. They expressed understanding and agreed with the plan, discharged home.             ____________________________________________   FINAL CLINICAL IMPRESSION(S) / ED DIAGNOSES   Final diagnoses:  Acute on chronic congestive heart failure, unspecified heart failure type (HCC)     MEDICATIONS GIVEN DURING THIS VISIT:  Medications  furosemide (LASIX) injection 40 mg (40 mg Intravenous Given 05/16/21 1747)     ED Discharge Orders          Ordered    furosemide (LASIX) 20 MG tablet  Daily        05/16/21 2024    AMB referral to CHF clinic        05/16/21 2024             Note:  This document was prepared using Dragon voice recognition software and may include unintentional dictation errors.   Concha Se, MD 05/16/21 2025

## 2021-05-16 NOTE — Discharge Instructions (Addendum)
Pick up your Lasix prescription tomorrow and start taking it tomorrow in the morning to help continue to urinate the fluid off of your lungs.  The CHF clinic should call you to make an appointment but if you not hear from them talk to your cardiologist or your primary care doctor to get a follow-up within the next 2 to 3 days to be reevaluated.  If you develop any worsening shortness of breath please return to the ER for repeat evaluation.  You will need a recheck of your labs to make sure that your potassium and kidney function are doing okay on the Lasix and to make sure that your breathing is staying good

## 2021-05-16 NOTE — ED Notes (Signed)
SpO2 while ambulating 94% on RA.

## 2021-05-16 NOTE — ED Notes (Signed)
Pt placed on cardiac, BP, pulse ox monitors. ?

## 2021-05-19 ENCOUNTER — Other Ambulatory Visit: Payer: Self-pay | Admitting: Orthopedic Surgery

## 2021-05-19 DIAGNOSIS — S8391XA Sprain of unspecified site of right knee, initial encounter: Secondary | ICD-10-CM

## 2021-05-19 DIAGNOSIS — M2391 Unspecified internal derangement of right knee: Secondary | ICD-10-CM

## 2021-05-23 ENCOUNTER — Encounter: Payer: Self-pay | Admitting: Family

## 2021-05-23 ENCOUNTER — Other Ambulatory Visit: Payer: Self-pay

## 2021-05-23 ENCOUNTER — Other Ambulatory Visit
Admission: RE | Admit: 2021-05-23 | Discharge: 2021-05-23 | Disposition: A | Discharge status: Home / Self Care | Payer: Medicare Other | Source: Ambulatory Visit | Attending: Family | Admitting: Family

## 2021-05-23 ENCOUNTER — Ambulatory Visit (HOSPITAL_BASED_OUTPATIENT_CLINIC_OR_DEPARTMENT_OTHER): Payer: Medicare Other | Admitting: Family

## 2021-05-23 ENCOUNTER — Telehealth: Payer: Self-pay | Admitting: Family

## 2021-05-23 VITALS — BP 154/84 | HR 67 | Resp 18 | Ht 66.0 in | Wt 162.4 lb

## 2021-05-23 DIAGNOSIS — I5022 Chronic systolic (congestive) heart failure: Secondary | ICD-10-CM | POA: Insufficient documentation

## 2021-05-23 DIAGNOSIS — Z79899 Other long term (current) drug therapy: Secondary | ICD-10-CM | POA: Insufficient documentation

## 2021-05-23 DIAGNOSIS — I110 Hypertensive heart disease with heart failure: Secondary | ICD-10-CM | POA: Insufficient documentation

## 2021-05-23 DIAGNOSIS — I10 Essential (primary) hypertension: Secondary | ICD-10-CM

## 2021-05-23 DIAGNOSIS — Z720 Tobacco use: Secondary | ICD-10-CM

## 2021-05-23 DIAGNOSIS — I252 Old myocardial infarction: Secondary | ICD-10-CM | POA: Insufficient documentation

## 2021-05-23 DIAGNOSIS — F17210 Nicotine dependence, cigarettes, uncomplicated: Secondary | ICD-10-CM | POA: Insufficient documentation

## 2021-05-23 LAB — BASIC METABOLIC PANEL
Anion gap: 3 — ABNORMAL LOW (ref 5–15)
BUN: 22 mg/dL (ref 8–23)
CO2: 34 mmol/L — ABNORMAL HIGH (ref 22–32)
Calcium: 8.9 mg/dL (ref 8.9–10.3)
Chloride: 104 mmol/L (ref 98–111)
Creatinine, Ser: 0.88 mg/dL (ref 0.61–1.24)
GFR, Estimated: >60 mL/min (ref >60)
Glucose, Bld: 115 mg/dL — ABNORMAL HIGH (ref 70–99)
Potassium: 3.4 mmol/L — ABNORMAL LOW (ref 3.5–5.1)
Sodium: 141 mmol/L (ref 135–145)

## 2021-05-23 MED ORDER — FUROSEMIDE 20 MG PO TABS: 20.0000 mg | ORAL_TABLET | Freq: Every day | ORAL | 5 refills | Status: DC

## 2021-05-23 MED ORDER — POTASSIUM CHLORIDE CRYS ER 20 MEQ PO TBCR: 20.0000 meq | EXTENDED_RELEASE_TABLET | Freq: Every day | ORAL | 3 refills | Status: DC

## 2021-05-23 NOTE — Patient Instructions (Addendum)
ONLY TAKE COREG, like you were taking. There was question if he was taking metoprolol and coreg...just take coreg.  Weigh daily in the am, record your weights on form in red folder. Call the heart failure clinic for wt gain > 3lb/overnight or 5 lb/week.  Start taking lasix (fluid pill) once/day.  Return in one month to clinic.   Bring all of your medication bottles/pill box at your next appointment.    [facility] - Cardiology Dr. Juliann Pares [address] [place], [place] [address] Office: [phone]

## 2021-05-23 NOTE — Telephone Encounter (Signed)
Called patient but was unable to reach in attempt to notify them that due to potassium being low, Blake [name] will be starting patient on potassium once a day and that [facility] [place].   [place], NT

## 2021-05-23 NOTE — Progress Notes (Signed)
Blake [name]  65 yo male patient [birth year] MRN [id number]  Blake [name] is a 65 yo male patient with a PMH significant for CHF, prior MI's.   Echo report from 12/17/20 showed an EF of 30-35% along with moderate/ severe MR, mild/moderate AS and mild PHTN.   He was recently in the ED on 05/16/21 for acute on chronic congestive heart failure. He stated that he had been having increased pedal edema x 1 week. He had not been on lasix for some time, he was not sure why or when it was stopped. He was diuresed and discharged home to f/u with cardiology and the HF clinic.  He presents today for his initial visit, with complaints of mild fatigue with moderate exertion. He reports feeling much better since he left the ED. He denies CP, SOB, palpitations, worsening edema, dizziness or syncope.   He is a poor historian. He lives with his sister, who helps him with his medications and appointments. He is accompanied by his SO today, but she is not familiar with his medical history. Called his sister to help determine what medications he is currently on, but she was not sure what exactly he takes either.  He does not weigh daily or check his BP.  Past Medical History:  Diagnosis Date   Acute headache    CHF (congestive heart failure) (HCC)    COPD (chronic obstructive pulmonary disease) (HCC)    Coronary artery disease    a. cath 2009 w/ severe 3-vessel dz, rec bypass; b. MI 2012-->4-vessel cabg; c. cath 2016: occluded VG-OM and VG-RPDA, patent LIMA-LAD and patent VG-Diag, 95% stenosis of mid LCx s/p PCI/DES 0% residual stenosis   Depression    Hepatitis B    Hepatitis C    HFrEF (heart failure with reduced ejection fraction) (HCC)    a. LV gram 2009 with EF 32% with anterolateral and apical HK   Hyperlipidemia    Hypertension    MI (myocardial infarction) (HCC)    x 2   Tobacco abuse    Past Surgical History:  Procedure Laterality Date   CARDIAC CATHETERIZATION     CORONARY ANGIOPLASTY  WITH STENT PLACEMENT  2009   CORONARY ARTERY BYPASS GRAFT  2012   [facility]   KNEE SURGERY     right knee    LEFT HEART CATH AND CORS/GRAFTS ANGIOGRAPHY N/A 06/09/2019   Procedure: LEFT HEART CATH AND CORS/GRAFTS ANGIOGRAPHY;  Surgeon: Iran Ouch, MD;  Location: [facility] INVASIVE CV LAB;  Service: Cardiovascular;  Laterality: N/A;   Family History  Problem Relation Age of Onset   Hypertension Mother    Hypertension Father    Hypertension Sister    Hypertension Sister    Social History   Tobacco Use   Smoking status: Every Day    Packs/day: 1.50    Years: 42.00    Pack years: 63.00    Types: Cigarettes   Smokeless tobacco: Never  Substance Use Topics   Alcohol use: No   No Known Allergies  Prior to Admission medications   Medication Sig Start Date End Date Taking? Authorizing Provider  aspirin 81 MG chewable tablet Chew 1 tablet (81 mg total) by mouth daily. 09/04/18  Yes Galen Manila, NP  carvedilol (COREG) 3.125 MG tablet Take by mouth. 11/22/20 11/22/21 Yes [provider]  furosemide (LASIX) 20 MG tablet Take 1 tablet (20 mg total) by mouth daily. 05/23/21 08/21/21 Yes Hackney, Inetta Fermo A, FNP  lisinopril (ZESTRIL) 2.5 MG tablet Take  2.5 mg by mouth daily.   Yes [provider]  loratadine (CLARITIN) 10 MG tablet Take 1 tablet (10 mg total) by mouth daily. 12/29/19  Yes Malfi, Jodelle Gross, FNP  nitroGLYCERIN (NITROSTAT) 0.4 MG SL tablet Place 1 tablet (0.4 mg total) under the tongue every 5 (five) minutes as needed for chest pain. Reported on 09/29/2015 12/29/19  Yes Malfi, Jodelle Gross, FNP  albuterol (VENTOLIN HFA) 108 (90 Base) MCG/ACT inhaler Inhale 2 puffs into the lungs every 6 (six) hours as needed for wheezing or shortness of breath. Patient not taking: Reported on 05/23/2021 12/29/19   Tarri Fuller, FNP  atorvastatin (LIPITOR) 40 MG tablet Take 1 tablet (40 mg total) by mouth daily at 6 PM. Patient not taking: Reported on 05/23/2021 12/29/19   Tarri Fuller, FNP  Fluticasone-Salmeterol (ADVAIR) 250-50 MCG/DOSE AEPB Inhale 1 puff into the lungs 2 (two) times daily. Patient not taking: Reported on 05/23/2021 12/29/19   Tarri Fuller, FNP  umeclidinium bromide (INCRUSE ELLIPTA) 62.5 MCG/INH AEPB Inhale 1 puff into the lungs daily. Patient not taking: Reported on 05/23/2021 01/06/20   Tarri Fuller, FNP    Review of Systems  Constitutional:  Positive for malaise/fatigue.  HENT:  Positive for congestion.   Eyes:  Negative for blurred vision and double vision.  Respiratory:  Positive for cough. Negative for hemoptysis, sputum production and shortness of breath.   Cardiovascular:  Negative for chest pain, palpitations, orthopnea and leg swelling.  Gastrointestinal: Negative.   Genitourinary: Negative.   Musculoskeletal: Negative.   Skin: Negative.   Neurological:  Negative for dizziness and headaches.  Endo/Heme/Allergies: Negative.   Psychiatric/Behavioral: Negative.      Vitals with BMI 05/23/2021 05/16/2021 05/16/2021  Height 5\' 6"  - -  Weight 162 lbs 6 oz - -  BMI 26.22 - -  Systolic 154 104  Diastolic 84 88 88  Pulse 67 - -    Filed Weights   05/23/21 1308  Weight: 162 lb 6 oz (73.7 kg)    Lab Results  Component Value Date   CREATININE 0.95 05/16/2021   CREATININE 1.12 09/15/2020   CREATININE 0.85 06/09/2019    Physical Exam Vitals and nursing note reviewed. Exam conducted with a chaperone present.  Constitutional:      General: He is not in acute distress.    Appearance: Normal appearance.  HENT:     Nose: Congestion present.  Neck:     Vascular: No carotid bruit.  Cardiovascular:     Rate and Rhythm: Normal rate and regular rhythm.     Pulses: Normal pulses.     Heart sounds: No murmur heard.   No gallop.  Pulmonary:     Breath sounds: Wheezing and rhonchi present. No rales.  Abdominal:     General: Abdomen is flat.     Palpations: Abdomen is soft.  Skin:    General: Skin is warm and dry.  Neurological:      Mental Status: He is alert and oriented to person, place, and time.  Psychiatric:        Mood and Affect: Mood normal.     Assessment & Plan:  1: Heart failure with reduced ejection fraction- - NYHA class II - Euvolemic today - not weighing daily, scales provided - encouraged to call for wt gain > 2 lbs/overnight or 5 lbs/week  - discussed low sodium diet, provided low sodium cookbook - discrepancy regarding his beta-blocker, he has carvedilol present but on record metoprolol  succinate and he reports he takes both - contacted his sister to help decipher what he is taking, she was not sure. Called pharmacy, but they were at lunch. - on lisinopril and BB - advised to continue taking carvedilol and if he has metoprolol succinate at home to NOT take  - start lasix 20 mg QD - check BMP today - will anticipate changing to Entresto at next visit, did not today in light of confusion surrounding his current medication regimen - provided contact to get re-established with Dr. Juliann Pares - BNP 730 on 05/16/21  2: Hypertension-  - BP today 154/84 - BMP reviewed, sodium 140, potassium 4.0, Cr 0.95, GFR > 60 - currently without a PCP, assisted with scheduling appt for new PCP @ [facility]   3: Cigarette use - smokes 1/2 PPD - counseled on complete cessation for 3 minutes  Medications reviewed, patient brought some medications with him. Attempted to reconcile but he is a poor historian and his sister was unable to help.   Return in one month.

## 2021-05-24 ENCOUNTER — Other Ambulatory Visit: Payer: Self-pay | Admitting: Family

## 2021-05-24 MED ORDER — FUROSEMIDE 20 MG PO TABS: 20.0000 mg | ORAL_TABLET | Freq: Every day | ORAL | 5 refills | Status: DC

## 2021-05-24 MED ORDER — POTASSIUM CHLORIDE CRYS ER 20 MEQ PO TBCR: 20.0000 meq | EXTENDED_RELEASE_TABLET | Freq: Every day | ORAL | 5 refills | Status: DC

## 2021-05-24 NOTE — Progress Notes (Signed)
[facility] [place] [facility] not in [place] so furosemide/ potassium was [facility] this [facility] [place].

## 2021-06-03 ENCOUNTER — Ambulatory Visit: Admission: RE | Admit: 2021-06-03 | Payer: Medicare Other | Source: Ambulatory Visit

## 2021-06-21 NOTE — Progress Notes (Deleted)
°   Patient ID: Blake [name], male    DOB: [birth year], 65 y.o.   MRN: [id number]  HPI  Mr Lariccia is a 65 y/o male with a history of  Echo report from 12/17/20 showed an EF of 30-35% along with moderate/ severe MR, mild/moderate AS and mild PHTN.   He was recently in the ED on 05/16/21 for acute on chronic congestive heart failure. He stated that he had been having increased pedal edema x 1 week. He had not been on lasix for some time, he was not sure why or when it was stopped. He was diuresed and discharged home to f/u with cardiology and the HF clinic.  He presents today for a follow-up visit with a chief complaint of   Review of Systems    Physical Exam  Assessment & Plan:  1: Heart failure with reduced ejection fraction- - NYHA class II - Euvolemic today - weighing daily; reminded to call for wt gain > 2 lbs/overnight or 5 lbs/week  - weight 162.6 from last visit here 1 month ago - discussed low sodium diet - on GDMT of  - check BMP today due to furosemide 20mg  started at last visit - will anticipate changing to [facility] at next visit, did not today in light of confusion surrounding his current medication regimen - provided contact to get re-established with Dr. [facility] - BNP 730 on 05/16/21  2: Hypertension-  - BP  - BMP 05/23/21 reviewed and showed sodium 141, potassium 3.4, Cr 0.88 and GFR > 60 - currently without a PCP, assisted with scheduling appt for new PCP @ [facility] but it's not until February 2023   3: Cigarette use - smokes 1/2 PPD - counseled on complete cessation for 3 minutes

## 2021-06-22 ENCOUNTER — Ambulatory Visit: Payer: Medicare Other | Admitting: Family

## 2021-06-23 ENCOUNTER — Telehealth: Payer: Self-pay | Admitting: Family

## 2021-06-23 NOTE — Telephone Encounter (Signed)
Patient did not show for his Heart Failure Clinic appointment on 06/22/21. Will attempt to reschedule.   °

## 2021-06-30 ENCOUNTER — Encounter: Payer: Self-pay | Admitting: Emergency Medicine

## 2021-06-30 ENCOUNTER — Emergency Department: Payer: Medicare Other

## 2021-06-30 ENCOUNTER — Other Ambulatory Visit: Payer: Self-pay

## 2021-06-30 ENCOUNTER — Emergency Department
Admission: EM | Admit: 2021-06-30 | Discharge: 2021-06-30 | Disposition: A | Discharge status: Home / Self Care | Payer: Medicare Other | Attending: Emergency Medicine | Admitting: Emergency Medicine

## 2021-06-30 DIAGNOSIS — F0390 Unspecified dementia without behavioral disturbance: Secondary | ICD-10-CM | POA: Insufficient documentation

## 2021-06-30 DIAGNOSIS — R0602 Shortness of breath: Secondary | ICD-10-CM | POA: Diagnosis not present

## 2021-06-30 DIAGNOSIS — J449 Chronic obstructive pulmonary disease, unspecified: Secondary | ICD-10-CM | POA: Diagnosis not present

## 2021-06-30 DIAGNOSIS — F17210 Nicotine dependence, cigarettes, uncomplicated: Secondary | ICD-10-CM | POA: Insufficient documentation

## 2021-06-30 DIAGNOSIS — Z951 Presence of aortocoronary bypass graft: Secondary | ICD-10-CM | POA: Insufficient documentation

## 2021-06-30 DIAGNOSIS — I110 Hypertensive heart disease with heart failure: Secondary | ICD-10-CM | POA: Diagnosis not present

## 2021-06-30 DIAGNOSIS — I5022 Chronic systolic (congestive) heart failure: Secondary | ICD-10-CM | POA: Insufficient documentation

## 2021-06-30 DIAGNOSIS — Z7951 Long term (current) use of inhaled steroids: Secondary | ICD-10-CM | POA: Insufficient documentation

## 2021-06-30 DIAGNOSIS — Z7982 Long term (current) use of aspirin: Secondary | ICD-10-CM | POA: Insufficient documentation

## 2021-06-30 DIAGNOSIS — R0789 Other chest pain: Secondary | ICD-10-CM | POA: Insufficient documentation

## 2021-06-30 DIAGNOSIS — I2510 Atherosclerotic heart disease of native coronary artery without angina pectoris: Secondary | ICD-10-CM | POA: Insufficient documentation

## 2021-06-30 DIAGNOSIS — Z79899 Other long term (current) drug therapy: Secondary | ICD-10-CM | POA: Insufficient documentation

## 2021-06-30 LAB — CBC
HCT: 41.6 % (ref 39.0–52.0)
Hemoglobin: 12.9 g/dL — ABNORMAL LOW (ref 13.0–17.0)
MCH: 30.1 pg (ref 26.0–34.0)
MCHC: 31 g/dL (ref 30.0–36.0)
MCV: 97.2 fL (ref 80.0–100.0)
Platelets: 234 10*3/uL (ref 150–400)
RBC: 4.28 MIL/uL (ref 4.22–5.81)
RDW: 13.2 % (ref 11.5–15.5)
WBC: 5.5 10*3/uL (ref 4.0–10.5)
nRBC: 0 % (ref 0.0–0.2)

## 2021-06-30 LAB — BASIC METABOLIC PANEL
Anion gap: 2 — ABNORMAL LOW (ref 5–15)
BUN: 19 mg/dL (ref 8–23)
CO2: 30 mmol/L (ref 22–32)
Calcium: 9 mg/dL (ref 8.9–10.3)
Chloride: 109 mmol/L (ref 98–111)
Creatinine, Ser: 0.9 mg/dL (ref 0.61–1.24)
GFR, Estimated: >60 mL/min (ref >60)
Glucose, Bld: 46 mg/dL — ABNORMAL LOW (ref 70–99)
Potassium: 3.8 mmol/L (ref 3.5–5.1)
Sodium: 141 mmol/L (ref 135–145)

## 2021-06-30 LAB — TROPONIN I (HIGH SENSITIVITY)
Troponin I (High Sensitivity): 34 ng/L — ABNORMAL HIGH (ref <18)
Troponin I (High Sensitivity): 33 ng/L — ABNORMAL HIGH (ref <18)

## 2021-06-30 LAB — BRAIN NATRIURETIC PEPTIDE: B Natriuretic Peptide: 477.9 pg/mL — ABNORMAL HIGH (ref 0.0–100.0)

## 2021-06-30 MED ORDER — ASPIRIN 81 MG PO CHEW
324.0000 mg | CHEWABLE_TABLET | Freq: Once | ORAL | Status: AC
  Administered 2021-06-30: 13:00:00 324 mg via ORAL
  Filled 2021-06-30: qty 4

## 2021-06-30 NOTE — Discharge Instructions (Signed)
If that chest pain comes back, please return to the ED.  Follow-up with Dr. Juliann Pares a soon as possible

## 2021-06-30 NOTE — ED Provider Notes (Signed)
[facility] Emergency Department Provider Note ____________________________________________   Event Date/Time   First MD Initiated Contact with Patient 06/30/21 1109     (approximate)  I have reviewed the triage vital signs and the nursing notes.  HISTORY  Chief Complaint Chest Pain   HPI Blake [name] is a 65 y.o. malewho presents to the ED for evaluation of chest pain.   Chart review indicates hx CAD , reduced ejection fraction of about 30%.  [facility] cardiology and Dr. Juliann Pares.  CABG x4 in 2009 through [facility].  Most recent left heart cath in our system was from 2 years ago that I reviewed.  Three-vessel disease.   Patient presents to the ED for evaluation of an episode of chest pain that occurred this morning.  Reports awakening at his baseline, but shortly after standing up and walking this morning developed burning in his chest reminiscent of previous MI.  Nonradiating and moderate intensity pain that lasted about 30 minutes and resolved after he took a single nitroglycerin.  Has not recurred.  Denies any associated symptoms such as dyspnea, dizziness, diaphoresis.   He reports feeling fine now and has no complaints.  He is asking to go home.  Past Medical History:  Diagnosis Date   Acute headache    CHF (congestive heart failure) (HCC)    COPD (chronic obstructive pulmonary disease) (HCC)    Coronary artery disease    a. cath 2009 w/ severe 3-vessel dz, rec bypass; b. MI 2012-->4-vessel cabg; c. cath 2016: occluded VG-OM and VG-RPDA, patent LIMA-LAD and patent VG-Diag, 95% stenosis of mid LCx s/p PCI/DES 0% residual stenosis   Depression    Hepatitis B    Hepatitis C    HFrEF (heart failure with reduced ejection fraction) (HCC)    a. LV gram 2009 with EF 32% with anterolateral and apical HK   Hyperlipidemia    Hypertension    MI (myocardial infarction) (HCC)    x 2   Tobacco abuse     Patient Active Problem List   Diagnosis Date Noted    Intellectual disability 04/15/2021   Elevated troponin    Chest pain 06/08/2019   Benign prostatic hyperplasia with urinary obstruction 09/29/2015   Chest pain at rest 09/29/2015   Cough 09/29/2015   Current tobacco use 09/29/2015   Clinical depression 09/29/2015   Essential (primary) hypertension 09/29/2015   Cephalalgia 09/29/2015   Heart disease 09/29/2015   Hypercholesteremia 09/29/2015   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   H/O acute myocardial infarction 09/29/2015   Alveolar aeration decreased 09/29/2015   Special screening for malignant neoplasms, colon 09/29/2015   Chest pain due to CAD (HCC) 09/16/2014   SOB (shortness of breath) 09/16/2014   Hepatitis 09/16/2014   S/P CABG (coronary artery bypass graft) 09/16/2014   Coronary artery disease involving native coronary artery of native heart with other form of angina pectoris (HCC) 09/16/2014   Shortness of breath 09/16/2014   Smoker 09/16/2014   Emphysema of lung (HCC) 09/16/2014   Chronic systolic CHF (congestive heart failure) (HCC) 09/16/2014   Hepatitis C virus infection 01/26/2011    Past Surgical History:  Procedure Laterality Date   CARDIAC CATHETERIZATION     CORONARY ANGIOPLASTY WITH STENT PLACEMENT  2009   CORONARY ARTERY BYPASS GRAFT  2012   [facility]   KNEE SURGERY     right knee    LEFT HEART CATH AND CORS/GRAFTS ANGIOGRAPHY N/A 06/09/2019   Procedure: LEFT HEART CATH AND CORS/GRAFTS ANGIOGRAPHY;  Surgeon:  Iran Ouch, MD;  Location: [facility] INVASIVE CV LAB;  Service: Cardiovascular;  Laterality: N/A;    Prior to Admission medications   Medication Sig Start Date End Date Taking? Authorizing Provider  albuterol (VENTOLIN HFA) 108 (90 Base) MCG/ACT inhaler Inhale 2 puffs into the lungs every 6 (six) hours as needed for wheezing or shortness of breath. Patient not taking: Reported on 05/23/2021 12/29/19   Tarri Fuller, FNP  aspirin 81 MG chewable tablet Chew 1 tablet (81 mg total) by mouth daily. 09/04/18    Galen Manila, NP  atorvastatin (LIPITOR) 40 MG tablet Take 1 tablet (40 mg total) by mouth daily at 6 PM. Patient not taking: Reported on 05/23/2021 12/29/19   Tarri Fuller, FNP  carvedilol (COREG) 3.125 MG tablet Take by mouth. 11/22/20 11/22/21  [provider]  Fluticasone-Salmeterol (ADVAIR) 250-50 MCG/DOSE AEPB Inhale 1 puff into the lungs 2 (two) times daily. Patient not taking: Reported on 05/23/2021 12/29/19   Tarri Fuller, FNP  furosemide (LASIX) 20 MG tablet Take 1 tablet (20 mg total) by mouth daily. 05/24/21 08/22/21  Delma Freeze, FNP  Ipratropium-Albuterol (COMBIVENT RESPIMAT) 20-100 MCG/ACT AERS respimat Inhale 2 puffs into the lungs every 6 (six) hours.    [provider]  lisinopril (ZESTRIL) 2.5 MG tablet Take 2.5 mg by mouth daily.    [provider]  loratadine (CLARITIN) 10 MG tablet Take 1 tablet (10 mg total) by mouth daily. 12/29/19   Malfi, Jodelle Gross, FNP  nitroGLYCERIN (NITROSTAT) 0.4 MG SL tablet Place 1 tablet (0.4 mg total) under the tongue every 5 (five) minutes as needed for chest pain. Reported on 09/29/2015 12/29/19   Tarri Fuller, FNP  omeprazole (PRILOSEC) 20 MG capsule Take 20 mg by mouth daily.    [provider]  potassium chloride SA (KLOR-CON) 20 MEQ tablet Take 1 tablet (20 mEq total) by mouth daily. 05/24/21   Delma Freeze, FNP  umeclidinium bromide (INCRUSE ELLIPTA) 62.5 MCG/INH AEPB Inhale 1 puff into the lungs daily. Patient not taking: Reported on 05/23/2021 01/06/20   Tarri Fuller, FNP    Allergies Patient has no known allergies.  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 ENT: No sore throat. Cardiovascular: Positive for chest pain. Respiratory: Denies shortness of breath. Gastrointestinal: No abdominal pain.  No nausea, no vomiting.  No diarrhea.  No constipation. Genitourinary: Negative for dysuria. Musculoskeletal: Negative for back pain. Skin: Negative for rash. Neurological: Negative for headaches, focal weakness or numbness.  ____________________________________________   PHYSICAL EXAM:  VITAL SIGNS: Vitals:   06/30/21 0959 06/30/21 1128  BP: (!) 159/94 (!) 152/87  Pulse: 87 75  Resp: 18 16  Temp: 97.8 F (36.6 C) 98 F (36.7 C)  SpO2: 96% 94%    Constitutional: Alert and oriented. Well appearing and in no acute distress. Eyes: Conjunctivae are normal.  PERRL. EOMI. Head: Atraumatic. Nose: No congestion/rhinnorhea. Mouth/Throat: Mucous membranes are moist.  Oropharynx non-erythematous. Neck: No stridor. No cervical spine tenderness to palpation. Cardiovascular: Normal rate, regular rhythm. Grossly normal heart sounds.  Good peripheral circulation. Respiratory: Normal respiratory effort.  No retractions. Lungs CTAB. Gastrointestinal: Soft , nondistended, nontender to palpation. No CVA tenderness. Musculoskeletal: No lower extremity tenderness nor edema.  No joint effusions. No signs of acute trauma. Neurologic:  Normal speech and language. No gross focal neurologic deficits are appreciated. No gait instability noted. Skin:  Skin is warm, dry and intact. No rash noted. Psychiatric: Mood and affect are normal. Speech and behavior are normal. ____________________________________________   LABS (all labs ordered are listed, but only abnormal results are displayed)  Labs Reviewed  BASIC METABOLIC PANEL - Abnormal; Notable for the following components:      Result Value    Glucose, Bld 46 (*)    Anion gap 2 (*)    All other components within normal limits  CBC - Abnormal; Notable for the following components:   Hemoglobin 12.9 (*)    All other components within normal limits  BRAIN NATRIURETIC PEPTIDE - Abnormal; Notable for the following components:   B Natriuretic Peptide 477.9 (*)    All other components within normal limits  TROPONIN I (HIGH SENSITIVITY) - Abnormal; Notable for the following components:   Troponin I (High Sensitivity) 34 (*)    All other components within normal limits  TROPONIN I (HIGH SENSITIVITY) - Abnormal; Notable for the following components:   Troponin I (High Sensitivity) 33 (*)    All other components within normal limits   ____________________________________________  12 Lead EKG  Sinus rhythm with a rate of 82 bpm.  Rightward axis and mild first-degree AV block.  Inferior and lateral T wave inversions without STEMI. Fairly similar to EKG from 1 month ago with these T wave inversions. ____________________________________________  RADIOLOGY  ED MD interpretation: 2 view CXR reviewed by me with some mild pulmonary interstitial edema without pleural effusions or infiltrate.  Official radiology report(s): DG Chest 2 View  Result Date: 06/30/2021 CLINICAL DATA:  Chest pain. EXAM: CHEST - 2 VIEW COMPARISON:  Chest x-ray dated May 16, 2021. FINDINGS: The heart size and mediastinal contours are within normal limits. Prior CABG. Slightly improved interstitial thickening. No focal consolidation, pleural effusion, or pneumothorax. No acute osseous abnormality. IMPRESSION: 1. Slightly improved mild pulmonary interstitial edema. No new acute cardiopulmonary disease. Electronically Signed   By: Obie Dredge M.D.   On: 06/30/2021 10:27    ____________________________________________   PROCEDURES and INTERVENTIONS  Procedure(s) performed (including Critical Care):  .1-3 Lead EKG Interpretation Performed by: Delton Prairie, MD Authorized by: Delton Prairie, MD     Interpretation: normal     ECG rate:  72   ECG rate assessment: normal     Rhythm: sinus rhythm     Ectopy: none     Conduction: normal    Medications  aspirin chewable tablet 324 mg (has no administration in time range)    ____________________________________________   MDM / ED COURSE   65 year old male with history of coronary disease presents to the ED with chest pain and ultimately demanding discharge.  Vitals and exam are reassuring.  He looks well and is asymptomatic here in the ED.  No signs of neurologic or vascular deficits.  Work-up with flat troponins that are marginally elevated.  BNP is slightly elevated and CXR is improved with minimal pulmonary vascular congestion.  I urged  him to stay for medical observation admission considering his high risk nature, but he refuses and says he wants to go home for the holidays, which I can understand.  We discussed management at home and return precautions for the ED, patient is suitable for close outpatient follow-up with cardiology.   Clinical Course as of 06/30/21 1252  Thu Jun 30, 2021  1239 Reassessed.  Remains pain-free.  We discussed disposition.  My recommendation was a medical admission considering he is high risk, slightly elevated troponins albeit flat, and his symptoms this morning.  He acknowledges this, but requests discharge due to upcoming holidays.  He reports that he will come right back if his pain returns and that he will reach out to Dr. Juliann Pares to be seen in the clinic ASAP.  He has capacity to make this decision and I see no immediate barriers to outpatient management [DS]    Clinical Course User Index [DS] Delton Prairie, MD    ____________________________________________   FINAL CLINICAL IMPRESSION(S) / ED DIAGNOSES  Final diagnoses:  Other chest pain     ED Discharge Orders     None        Xoey Warmoth Katrinka Blazing   Note:  This document was prepared using  Dragon voice recognition software and may include unintentional dictation errors.    Delton Prairie, MD 06/30/21 1254

## 2021-06-30 NOTE — ED Triage Notes (Signed)
Presents with chest pain this am  states pain started this am as burning    felt better after taking NTG PTA

## 2021-07-25 ENCOUNTER — Other Ambulatory Visit: Payer: Self-pay

## 2021-07-25 ENCOUNTER — Emergency Department
Admission: EM | Admit: 2021-07-25 | Discharge: 2021-07-25 | Disposition: A | Discharge status: Home / Self Care | Payer: Medicare Other | Attending: Emergency Medicine | Admitting: Emergency Medicine

## 2021-07-25 ENCOUNTER — Emergency Department: Payer: Medicare Other

## 2021-07-25 DIAGNOSIS — K4090 Unilateral inguinal hernia, without obstruction or gangrene, not specified as recurrent: Secondary | ICD-10-CM

## 2021-07-25 DIAGNOSIS — Z20822 Contact with and (suspected) exposure to covid-19: Secondary | ICD-10-CM | POA: Insufficient documentation

## 2021-07-25 DIAGNOSIS — I509 Heart failure, unspecified: Secondary | ICD-10-CM | POA: Insufficient documentation

## 2021-07-25 DIAGNOSIS — J069 Acute upper respiratory infection, unspecified: Secondary | ICD-10-CM

## 2021-07-25 DIAGNOSIS — Z951 Presence of aortocoronary bypass graft: Secondary | ICD-10-CM | POA: Insufficient documentation

## 2021-07-25 DIAGNOSIS — R059 Cough, unspecified: Secondary | ICD-10-CM | POA: Diagnosis present

## 2021-07-25 LAB — CBC WITH DIFFERENTIAL/PLATELET
Abs Immature Granulocytes: 0.02 10*3/uL (ref 0.00–0.07)
Basophils Absolute: 0 10*3/uL (ref 0.0–0.1)
Basophils Relative: 1 %
Eosinophils Absolute: 0.2 10*3/uL (ref 0.0–0.5)
Eosinophils Relative: 3 %
HCT: 43.5 % (ref 39.0–52.0)
Hemoglobin: 13.5 g/dL (ref 13.0–17.0)
Immature Granulocytes: 0 %
Lymphocytes Relative: 24 %
Lymphs Abs: 1.2 10*3/uL (ref 0.7–4.0)
MCH: 29.9 pg (ref 26.0–34.0)
MCHC: 31 g/dL (ref 30.0–36.0)
MCV: 96.5 fL (ref 80.0–100.0)
Monocytes Absolute: 0.6 10*3/uL (ref 0.1–1.0)
Monocytes Relative: 12 %
Neutro Abs: 3 10*3/uL (ref 1.7–7.7)
Neutrophils Relative %: 60 %
Platelets: 221 10*3/uL (ref 150–400)
RBC: 4.51 MIL/uL (ref 4.22–5.81)
RDW: 12.9 % (ref 11.5–15.5)
WBC: 5.1 10*3/uL (ref 4.0–10.5)
nRBC: 0 % (ref 0.0–0.2)

## 2021-07-25 LAB — COMPREHENSIVE METABOLIC PANEL
ALT: 19 U/L (ref 0–44)
AST: 24 U/L (ref 15–41)
Albumin: 3.6 g/dL (ref 3.5–5.0)
Alkaline Phosphatase: 88 U/L (ref 38–126)
Anion gap: 5 (ref 5–15)
BUN: 25 mg/dL — ABNORMAL HIGH (ref 8–23)
CO2: 32 mmol/L (ref 22–32)
Calcium: 9.1 mg/dL (ref 8.9–10.3)
Chloride: 102 mmol/L (ref 98–111)
Creatinine, Ser: 1.02 mg/dL (ref 0.61–1.24)
GFR, Estimated: >60 mL/min (ref >60)
Glucose, Bld: 112 mg/dL — ABNORMAL HIGH (ref 70–99)
Potassium: 4.2 mmol/L (ref 3.5–5.1)
Sodium: 139 mmol/L (ref 135–145)
Total Bilirubin: 0.6 mg/dL (ref 0.3–1.2)
Total Protein: 8.1 g/dL (ref 6.5–8.1)

## 2021-07-25 LAB — RESP PANEL BY RT-PCR (FLU A&B, COVID) ARPGX2
Influenza A by PCR: NEGATIVE
Influenza B by PCR: NEGATIVE
SARS Coronavirus 2 by RT PCR: NEGATIVE

## 2021-07-25 LAB — BRAIN NATRIURETIC PEPTIDE: B Natriuretic Peptide: 403.6 pg/mL — ABNORMAL HIGH (ref 0.0–100.0)

## 2021-07-25 MED ORDER — BENZONATATE 100 MG PO CAPS: 100.0000 mg | ORAL_CAPSULE | Freq: Three times a day (TID) | ORAL | 0 refills | Status: DC | PRN

## 2021-07-25 MED ORDER — PREDNISONE 50 MG PO TABS: 50.0000 mg | ORAL_TABLET | Freq: Every day | ORAL | 0 refills | Status: DC

## 2021-07-25 NOTE — ED Triage Notes (Signed)
Pt comes with c/o cough, congestion runny nose for 2 days.

## 2021-07-25 NOTE — ED Provider Notes (Signed)
[facility] Provider Note  Patient Contact: 4:22 PM (approximate)   History   Cough   HPI  Blake [name] is a 66 y.o. male who presents the emergency department complaining of cough, nasal congestion x2 days.  Patient denied any shortness of breath, difficulty breathing or chest pain.  He does have a history of emphysema, CHF but states that the cough is worse than his baseline.  He does have chronic peripheral edema from his CHF but denies any acute worsening of same.  With further history of a CABG, MI, hepatitis, mild intellectual disability.  Patient states that there are other members of his group home with similar symptoms.  He is also concerned as he has a known inguinal hernia that he states "bulges" when he starts coughing.  This spontaneously reduces but he is concerned as every time he coughs he sees the bulge.  No testicular pain or scrotal pain.  Patient denies any groin or penile pain.  No urinary symptoms at this time.     Physical Exam   Triage Vital Signs: ED Triage Vitals  Enc Vitals Group     BP 07/25/21 1201 (!) 162/86     Pulse Rate 07/25/21 1201 73     Resp 07/25/21 1201 19     Temp 07/25/21 1201 98.3 F (36.8 C)     Temp Source 07/25/21 1201 Oral     SpO2 07/25/21 1201 94 %     Weight 07/25/21 1441 162 lb 7.7 oz (73.7 kg)     Height 07/25/21 1441 5\' 6"  (1.676 m)     Head Circumference --      Peak Flow --      Pain Score 07/25/21 1200 3     Pain Loc --      Pain Edu? --      Excl. in [place]? --     Most recent vital signs: Vitals:   07/25/21 1201  BP: (!) 162/86  Pulse: 73  Resp: 19  Temp: 98.3 F (36.8 C)  SpO2: 94%     General: Alert and in no acute distress. ENT:      Ears:       Nose: No congestion/rhinnorhea.      Mouth/Throat: Mucous membranes are moist. Neck: No stridor. No cervical spine tenderness to palpation Hematological/Lymphatic/Immunilogical: No cervical lymphadenopathy. Cardiovascular:  Good  peripheral perfusion Respiratory: Normal respiratory effort without tachypnea or retractions. Lungs with some crackles and rales bilaterally in the lower lung fields.  No wheezing.Kermit Balo air entry to the bases with no decreased or absent breath sounds. Gastrointestinal: Bowel sounds 4 quadrants. Soft and nontender to palpation. No guarding or rigidity. No palpable masses. No distention. No CVA tenderness.  No visible bulge in his right inguinal region.  Patient reports tenderness along the right inguinal canal but did not have palpable abnormality at this time. Musculoskeletal: Full range of motion to all extremities.  Neurologic:  No gross focal neurologic deficits are appreciated.  Skin:   No rash noted Other:   ED Results / Procedures / Treatments   Labs (all labs ordered are listed, but only abnormal results are displayed) Labs Reviewed  COMPREHENSIVE METABOLIC PANEL - Abnormal; Notable for the following components:      Result Value   Glucose, Bld 112 (*)    BUN 25 (*)    All other components within normal limits  BRAIN NATRIURETIC PEPTIDE - Abnormal; Notable for the following components:   B Natriuretic Peptide  403.6 (*)    All other components within normal limits  RESP PANEL BY RT-PCR (FLU A&B, COVID) ARPGX2  CBC WITH DIFFERENTIAL/PLATELET     EKG     RADIOLOGY  I personally viewed and evaluated these images as part of my medical decision making, as well as reviewing the written report by the radiologist.  ED Provider Interpretation: No acute consolidations on chest x-ray concerning for lobar pneumonia.  DG Chest 2 View  Result Date: 07/25/2021 CLINICAL DATA:  Provided history: Cough. Additional history provided: Cough, congestion, runny nose for 2 days. History of myocardial infarction, cardiac stents, open heart surgery. EXAM: CHEST - 2 VIEW COMPARISON:  Prior chest radiographs 06/30/2021. FINDINGS: Prior median sternotomy/CABG. Heart size within normal limits.  Aortic atherosclerosis. No appreciable airspace consolidation or pulmonary edema. Probable mild scarring within the left lung base, unchanged. No evidence of pleural effusion or pneumothorax. No acute bony abnormality identified. IMPRESSION: No evidence of acute cardiopulmonary abnormality. Aortic Atherosclerosis (ICD10-I70.0). Electronically Signed   By: Kellie Simmering D.O.   On: 07/25/2021 16:34    PROCEDURES:  Critical Care performed: No  Procedures   MEDICATIONS ORDERED IN ED: Medications - No data to display   IMPRESSION / MDM / [place] / ED COURSE  I reviewed the triage vital signs and the nursing notes.                              Differential diagnosis includes, but is not limited to, viral URI, CHF exacerbation, pneumonia, COVID, flu, inguinal hernia, strangulated inguinal hernia   Patient's diagnosis is consistent with viral URI, hernia.  Patient presents emergency department complaining of cough.  Patient is in a group home and multiple of the other members are also having similar symptoms.  No chest pain or shortness of breath at this time.  Patient was negative for COVID and flu.  Chest x-ray is reassuring.  Labs are reassuring at this time.  Patient was also concerned that he has a hernia in the right groin and that enlarges when he coughs.  This reduces spontaneously and there was no palpable hernia at this time.  We discussed following up with general surgery if he would like this repaired but at this time there is no concern for incarceration..  Patient will have short course of prednisone and cough medication.  Follow-up primary care as needed.  Return precautions discussed with the patient.  Patient is given ED precautions to return to the ED for any worsening or new symptoms.        FINAL CLINICAL IMPRESSION(S) / ED DIAGNOSES   Final diagnoses:  Viral URI with cough  Unilateral inguinal hernia without obstruction or gangrene, recurrence not specified      Rx / DC Orders   ED Discharge Orders          Ordered    predniSONE (DELTASONE) 50 MG tablet  Daily with breakfast        07/25/21 1650    benzonatate (TESSALON PERLES) 100 MG capsule  3 times daily PRN        07/25/21 1650             Note:  This document was prepared using Dragon voice recognition software and may include unintentional dictation errors.   Brynda Peon 07/25/21 1831    Rada Hay, MD 07/26/21 0000

## 2021-07-25 NOTE — ED Notes (Signed)
Patient transported to X-ray 

## 2021-08-09 ENCOUNTER — Other Ambulatory Visit: Payer: Self-pay

## 2021-08-09 ENCOUNTER — Emergency Department: Payer: Medicare Other

## 2021-08-09 ENCOUNTER — Emergency Department
Admission: EM | Admit: 2021-08-09 | Discharge: 2021-08-09 | Disposition: A | Discharge status: Home / Self Care | Payer: Medicare Other | Attending: Emergency Medicine | Admitting: Emergency Medicine

## 2021-08-09 DIAGNOSIS — Z955 Presence of coronary angioplasty implant and graft: Secondary | ICD-10-CM | POA: Diagnosis not present

## 2021-08-09 DIAGNOSIS — M25561 Pain in right knee: Secondary | ICD-10-CM | POA: Diagnosis present

## 2021-08-09 DIAGNOSIS — Z20822 Contact with and (suspected) exposure to covid-19: Secondary | ICD-10-CM | POA: Diagnosis not present

## 2021-08-09 DIAGNOSIS — R2241 Localized swelling, mass and lump, right lower limb: Secondary | ICD-10-CM | POA: Insufficient documentation

## 2021-08-09 DIAGNOSIS — R0602 Shortness of breath: Secondary | ICD-10-CM | POA: Diagnosis not present

## 2021-08-09 LAB — CBC WITH DIFFERENTIAL/PLATELET
Abs Immature Granulocytes: 0.03 10*3/uL (ref 0.00–0.07)
Basophils Absolute: 0 10*3/uL (ref 0.0–0.1)
Basophils Relative: 1 %
Eosinophils Absolute: 0.1 10*3/uL (ref 0.0–0.5)
Eosinophils Relative: 2 %
HCT: 45.2 % (ref 39.0–52.0)
Hemoglobin: 13.8 g/dL (ref 13.0–17.0)
Immature Granulocytes: 1 %
Lymphocytes Relative: 18 %
Lymphs Abs: 1 10*3/uL (ref 0.7–4.0)
MCH: 29.9 pg (ref 26.0–34.0)
MCHC: 30.5 g/dL (ref 30.0–36.0)
MCV: 98 fL (ref 80.0–100.0)
Monocytes Absolute: 0.6 10*3/uL (ref 0.1–1.0)
Monocytes Relative: 10 %
Neutro Abs: 3.6 10*3/uL (ref 1.7–7.7)
Neutrophils Relative %: 68 %
Platelets: 251 10*3/uL (ref 150–400)
RBC: 4.61 MIL/uL (ref 4.22–5.81)
RDW: 12.8 % (ref 11.5–15.5)
WBC: 5.3 10*3/uL (ref 4.0–10.5)
nRBC: 0 % (ref 0.0–0.2)

## 2021-08-09 LAB — COMPREHENSIVE METABOLIC PANEL
ALT: 24 U/L (ref 0–44)
AST: 28 U/L (ref 15–41)
Albumin: 3.5 g/dL (ref 3.5–5.0)
Alkaline Phosphatase: 97 U/L (ref 38–126)
Anion gap: 3 — ABNORMAL LOW (ref 5–15)
BUN: 18 mg/dL (ref 8–23)
CO2: 32 mmol/L (ref 22–32)
Calcium: 8.7 mg/dL — ABNORMAL LOW (ref 8.9–10.3)
Chloride: 103 mmol/L (ref 98–111)
Creatinine, Ser: 0.94 mg/dL (ref 0.61–1.24)
GFR, Estimated: >60 mL/min (ref >60)
Glucose, Bld: 130 mg/dL — ABNORMAL HIGH (ref 70–99)
Potassium: 4 mmol/L (ref 3.5–5.1)
Sodium: 138 mmol/L (ref 135–145)
Total Bilirubin: 0.6 mg/dL (ref 0.3–1.2)
Total Protein: 8.1 g/dL (ref 6.5–8.1)

## 2021-08-09 LAB — RESP PANEL BY RT-PCR (FLU A&B, COVID) ARPGX2
Influenza A by PCR: NEGATIVE
Influenza B by PCR: NEGATIVE
SARS Coronavirus 2 by RT PCR: NEGATIVE

## 2021-08-09 LAB — TROPONIN I (HIGH SENSITIVITY): Troponin I (High Sensitivity): 29 ng/L — ABNORMAL HIGH (ref <18)

## 2021-08-09 MED ORDER — DEXAMETHASONE SODIUM PHOSPHATE 10 MG/ML IJ SOLN
10.0000 mg | Freq: Once | INTRAMUSCULAR | Status: AC
  Administered 2021-08-09: 10 mg via INTRAVENOUS
  Filled 2021-08-09: qty 1

## 2021-08-09 MED ORDER — ALBUTEROL SULFATE HFA 108 (90 BASE) MCG/ACT IN AERS: 2.0000 | INHALATION_SPRAY | Freq: Four times a day (QID) | RESPIRATORY_TRACT | 2 refills | Status: DC | PRN

## 2021-08-09 MED ORDER — IPRATROPIUM-ALBUTEROL 0.5-2.5 (3) MG/3ML IN SOLN: 3.0000 mL | Freq: Once | RESPIRATORY_TRACT | Status: DC

## 2021-08-09 MED ORDER — PREDNISONE 10 MG (21) PO TBPK: ORAL_TABLET | ORAL | 0 refills | Status: DC

## 2021-08-09 MED ORDER — IPRATROPIUM-ALBUTEROL 0.5-2.5 (3) MG/3ML IN SOLN
3.0000 mL | Freq: Once | RESPIRATORY_TRACT | Status: AC
  Administered 2021-08-09: 3 mL via RESPIRATORY_TRACT
  Filled 2021-08-09: qty 3

## 2021-08-09 MED ORDER — IPRATROPIUM-ALBUTEROL 0.5-2.5 (3) MG/3ML IN SOLN
3.0000 mL | Freq: Once | RESPIRATORY_TRACT | Status: AC
  Administered 2021-08-09: 3 mL via RESPIRATORY_TRACT
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
## 2021-08-09 NOTE — ED Provider Notes (Signed)
[facility] Provider Note    Event Date/Time   First MD Initiated Contact with Patient 08/09/21 [id number]     (approximate)   History   Knee Pain   HPI  Taesean Earhart is a 66 y.o. male with history of cardiac stents 2 years ago presents emergency department complaining of right knee pain, right lower leg swelling, and some shortness of breath.  Patient states he had pneumonia last week.  Cannot lay down to breathe.  Has rattling in his chest.  No fever or chills.  No chest pain at this time.      Physical Exam   Triage Vital Signs: ED Triage Vitals  Enc Vitals Group     BP 08/09/21 0904 (!) 157/83     Pulse Rate 08/09/21 0904 73     Resp 08/09/21 0904 18     Temp 08/09/21 0904 98 F (36.7 C)     Temp Source 08/09/21 0904 Oral     SpO2 08/09/21 0904 95 %     Weight 08/09/21 0905 162 lb 7.7 oz (73.7 kg)     Height 08/09/21 0905 5\' 6"  (1.676 m)     Head Circumference --      Peak Flow --      Pain Score 08/09/21 0905 6     Pain Loc --      Pain Edu? --      Excl. in [place]? --     Most recent vital signs: Vitals:   08/09/21 0904  BP: (!) 157/83  Pulse: 73  Resp: 18  Temp: 98 F (36.7 C)  SpO2: 95%     General: Awake, no distress.   CV:  Good peripheral perfusion. regular rate and  rhythm Resp:  Normal effort. Lungs with diffuse wheezes, cough appears to be wet Abd:  No distention.   Other:  Right lower leg is red and swollen when compared to the left, right knee is minimally tender, neurovascular is intact   ED Results / Procedures / Treatments   Labs (all labs ordered are listed, but only abnormal results are displayed) Labs Reviewed  COMPREHENSIVE METABOLIC PANEL - Abnormal; Notable for the following components:      Result Value   Glucose, Bld 130 (*)    Calcium 8.7 (*)    Anion gap 3 (*)    All other components within normal limits  TROPONIN I (HIGH SENSITIVITY) - Abnormal; Notable for the following components:   Troponin  I (High Sensitivity) 29 (*)    All other components within normal limits  RESP PANEL BY RT-PCR (FLU A&B, COVID) ARPGX2  CBC WITH DIFFERENTIAL/PLATELET     EKG     RADIOLOGY Chest x-ray, ultrasound right lower extremity, x-ray of the right knee    PROCEDURES:   Procedures   MEDICATIONS ORDERED IN ED: Medications  ipratropium-albuterol (DUONEB) 0.5-2.5 (3) MG/3ML nebulizer solution 3 mL (has no administration in time range)  dexamethasone (DECADRON) injection 10 mg (has no administration in time range)  ipratropium-albuterol (DUONEB) 0.5-2.5 (3) MG/3ML nebulizer solution 3 mL (3 mLs Nebulization Given 08/09/21 1044)     IMPRESSION / MDM / ASSESSMENT AND PLAN / ED COURSE  I reviewed the triage vital signs and the nursing notes.                              Differential diagnosis includes, but is not limited to, DVT, PE, CAP, CHF,  knee sprain  Labs and imaging ordered  I did review the x-ray of the knee and the chest x-ray.  I do not see any signs of CHF, CAP, or fracture.  Radiologist did review both as being negative for any acute abnormalities.  Labs are reassuring, CBC, metabolic panel are normal, troponin is normal for the patient.  It is elevated at 29 but his norm is 34.  Patient was given 2 DuoNeb's while here in the ED.  Some relief with wheezing.  Decadron 10 mg IV.  Is given a prescription for Sterapred start tomorrow.  Albuterol inhaler.  He is to follow-up with his regular doctor if not improving in 3 days.  Follow-up with orthopedics for the right knee pain if not improving in 1 week.  Return emergency department worsening.  He was discharged in stable condition.         FINAL CLINICAL IMPRESSION(S) / ED DIAGNOSES   Final diagnoses:  Acute pain of right knee  Shortness of breath     Rx / DC Orders   ED Discharge Orders          Ordered    albuterol (VENTOLIN HFA) 108 (90 Base) MCG/ACT inhaler  Every 6 hours PRN        08/09/21 1115     predniSONE (STERAPRED UNI-PAK 21 TAB) 10 MG (21) TBPK tablet        08/09/21 1115             Note:  This document was prepared using Dragon voice recognition software and may include unintentional dictation errors.    Faythe Ghee, PA-C 08/09/21 1118    Jene Every, MD 08/09/21 1149

## 2021-08-09 NOTE — Discharge Instructions (Addendum)
Follow up with your regular doctor if not better in 3 days, return to the ER if worsening Take the medications as prescribed
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
## 2021-08-15 ENCOUNTER — Other Ambulatory Visit: Payer: Self-pay

## 2021-08-15 ENCOUNTER — Ambulatory Visit (INDEPENDENT_AMBULATORY_CARE_PROVIDER_SITE_OTHER): Payer: Medicare Other | Admitting: Internal Medicine

## 2021-08-15 ENCOUNTER — Encounter: Payer: Self-pay | Admitting: Internal Medicine

## 2021-08-15 VITALS — BP 130/58 | HR 80 | Temp 97.9°F | Ht 65.98 in | Wt 167.0 lb

## 2021-08-15 DIAGNOSIS — M25561 Pain in right knee: Secondary | ICD-10-CM | POA: Diagnosis not present

## 2021-08-15 DIAGNOSIS — G8929 Other chronic pain: Secondary | ICD-10-CM

## 2021-08-15 DIAGNOSIS — I509 Heart failure, unspecified: Secondary | ICD-10-CM | POA: Insufficient documentation

## 2021-08-15 DIAGNOSIS — R739 Hyperglycemia, unspecified: Secondary | ICD-10-CM | POA: Insufficient documentation

## 2021-08-15 DIAGNOSIS — K759 Inflammatory liver disease, unspecified: Secondary | ICD-10-CM

## 2021-08-15 DIAGNOSIS — R0602 Shortness of breath: Secondary | ICD-10-CM | POA: Diagnosis not present

## 2021-08-15 LAB — BAYER DCA HB A1C WAIVED: HB A1C (BAYER DCA - WAIVED): 6 % — ABNORMAL HIGH (ref 4.8–5.6)

## 2021-08-15 MED ORDER — UMECLIDINIUM-VILANTEROL 62.5-25 MCG/ACT IN AEPB: 1.0000 | INHALATION_SPRAY | Freq: Every day | RESPIRATORY_TRACT | 2 refills | Status: DC

## 2021-08-15 MED ORDER — ALBUTEROL SULFATE HFA 108 (90 BASE) MCG/ACT IN AERS: 2.0000 | INHALATION_SPRAY | Freq: Four times a day (QID) | RESPIRATORY_TRACT | 2 refills | Status: DC | PRN
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
## 2021-08-16 ENCOUNTER — Telehealth: Payer: Self-pay

## 2021-08-16 LAB — COMPREHENSIVE METABOLIC PANEL
ALT: 29 IU/L (ref 0–44)
AST: 29 IU/L (ref 0–40)
Albumin/Globulin Ratio: 0.9 — ABNORMAL LOW (ref 1.2–2.2)
Albumin: 3.7 g/dL — ABNORMAL LOW (ref 3.8–4.8)
Alkaline Phosphatase: 127 IU/L — ABNORMAL HIGH (ref 44–121)
BUN/Creatinine Ratio: 23 (ref 10–24)
BUN: 21 mg/dL (ref 8–27)
Bilirubin Total: 0.2 mg/dL (ref 0.0–1.2)
CO2: 28 mmol/L (ref 20–29)
Calcium: 9.4 mg/dL (ref 8.6–10.2)
Chloride: 107 mmol/L — ABNORMAL HIGH (ref 96–106)
Creatinine, Ser: 0.9 mg/dL (ref 0.76–1.27)
Globulin, Total: 4 g/dL (ref 1.5–4.5)
Glucose: 68 mg/dL — ABNORMAL LOW (ref 70–99)
Potassium: 4.2 mmol/L (ref 3.5–5.2)
Sodium: 146 mmol/L — ABNORMAL HIGH (ref 134–144)
Total Protein: 7.7 g/dL (ref 6.0–8.5)
eGFR: 94 mL/min/{1.73_m2} (ref >59)

## 2021-08-16 LAB — CBC WITH DIFFERENTIAL/PLATELET
Basophils Absolute: 0 10*3/uL (ref 0.0–0.2)
Basos: 1 %
EOS (ABSOLUTE): 0.2 10*3/uL (ref 0.0–0.4)
Eos: 3 %
Hematocrit: 40 % (ref 37.5–51.0)
Hemoglobin: 13.3 g/dL (ref 13.0–17.7)
Immature Grans (Abs): 0 10*3/uL (ref 0.0–0.1)
Immature Granulocytes: 0 %
Lymphocytes Absolute: 1.3 10*3/uL (ref 0.7–3.1)
Lymphs: 18 %
MCH: 29.8 pg (ref 26.6–33.0)
MCHC: 33.3 g/dL (ref 31.5–35.7)
MCV: 90 fL (ref 79–97)
Monocytes Absolute: 0.6 10*3/uL (ref 0.1–0.9)
Monocytes: 9 %
Neutrophils Absolute: 5 10*3/uL (ref 1.4–7.0)
Neutrophils: 69 %
Platelets: 222 10*3/uL (ref 150–450)
RBC: 4.46 x10E6/uL (ref 4.14–5.80)
RDW: 11.6 % (ref 11.6–15.4)
WBC: 7.2 10*3/uL (ref 3.4–10.8)

## 2021-08-16 NOTE — Telephone Encounter (Signed)
Scheduled for 11/15/2021 °

## 2021-09-05 ENCOUNTER — Encounter: Payer: Self-pay | Admitting: Intensive Care

## 2021-09-05 ENCOUNTER — Other Ambulatory Visit: Payer: Self-pay

## 2021-09-05 ENCOUNTER — Emergency Department
Admission: EM | Admit: 2021-09-05 | Discharge: 2021-09-05 | Disposition: A | Discharge status: Home / Self Care | Payer: Medicare Other | Attending: Emergency Medicine | Admitting: Emergency Medicine

## 2021-09-05 ENCOUNTER — Emergency Department: Payer: Medicare Other

## 2021-09-05 DIAGNOSIS — F0390 Unspecified dementia without behavioral disturbance: Secondary | ICD-10-CM | POA: Diagnosis not present

## 2021-09-05 DIAGNOSIS — I2510 Atherosclerotic heart disease of native coronary artery without angina pectoris: Secondary | ICD-10-CM | POA: Diagnosis not present

## 2021-09-05 DIAGNOSIS — I509 Heart failure, unspecified: Secondary | ICD-10-CM | POA: Insufficient documentation

## 2021-09-05 DIAGNOSIS — K4020 Bilateral inguinal hernia, without obstruction or gangrene, not specified as recurrent: Secondary | ICD-10-CM | POA: Diagnosis not present

## 2021-09-05 DIAGNOSIS — R109 Unspecified abdominal pain: Secondary | ICD-10-CM | POA: Diagnosis present

## 2021-09-05 DIAGNOSIS — K4090 Unilateral inguinal hernia, without obstruction or gangrene, not specified as recurrent: Secondary | ICD-10-CM

## 2021-09-05 LAB — CBC WITH DIFFERENTIAL/PLATELET
Abs Immature Granulocytes: 0.01 10*3/uL (ref 0.00–0.07)
Basophils Absolute: 0 10*3/uL (ref 0.0–0.1)
Basophils Relative: 1 %
Eosinophils Absolute: 0.2 10*3/uL (ref 0.0–0.5)
Eosinophils Relative: 4 %
HCT: 47.1 % (ref 39.0–52.0)
Hemoglobin: 14.6 g/dL (ref 13.0–17.0)
Immature Granulocytes: 0 %
Lymphocytes Relative: 24 %
Lymphs Abs: 1.2 10*3/uL (ref 0.7–4.0)
MCH: 29.1 pg (ref 26.0–34.0)
MCHC: 31 g/dL (ref 30.0–36.0)
MCV: 94 fL (ref 80.0–100.0)
Monocytes Absolute: 0.5 10*3/uL (ref 0.1–1.0)
Monocytes Relative: 11 %
Neutro Abs: 2.9 10*3/uL (ref 1.7–7.7)
Neutrophils Relative %: 60 %
Platelets: 258 10*3/uL (ref 150–400)
RBC: 5.01 MIL/uL (ref 4.22–5.81)
RDW: 12.9 % (ref 11.5–15.5)
WBC: 4.8 10*3/uL (ref 4.0–10.5)
nRBC: 0 % (ref 0.0–0.2)

## 2021-09-05 LAB — URINALYSIS, ROUTINE W REFLEX MICROSCOPIC
Bilirubin Urine: NEGATIVE
Glucose, UA: NEGATIVE mg/dL
Hgb urine dipstick: NEGATIVE
Ketones, ur: NEGATIVE mg/dL
Nitrite: NEGATIVE
Protein, ur: NEGATIVE mg/dL
Specific Gravity, Urine: 1.019 (ref 1.005–1.030)
pH: 6 (ref 5.0–8.0)

## 2021-09-05 LAB — COMPREHENSIVE METABOLIC PANEL
ALT: 31 U/L (ref 0–44)
AST: 34 U/L (ref 15–41)
Albumin: 3.6 g/dL (ref 3.5–5.0)
Alkaline Phosphatase: 122 U/L (ref 38–126)
Anion gap: 5 (ref 5–15)
BUN: 17 mg/dL (ref 8–23)
CO2: 28 mmol/L (ref 22–32)
Calcium: 9.1 mg/dL (ref 8.9–10.3)
Chloride: 104 mmol/L (ref 98–111)
Creatinine, Ser: 0.96 mg/dL (ref 0.61–1.24)
GFR, Estimated: >60 mL/min (ref >60)
Glucose, Bld: 126 mg/dL — ABNORMAL HIGH (ref 70–99)
Potassium: 4.4 mmol/L (ref 3.5–5.1)
Sodium: 137 mmol/L (ref 135–145)
Total Bilirubin: 0.5 mg/dL (ref 0.3–1.2)
Total Protein: 8.3 g/dL — ABNORMAL HIGH (ref 6.5–8.1)

## 2021-09-05 LAB — LIPASE, BLOOD: Lipase: 23 U/L (ref 11–51)

## 2021-09-05 MED ORDER — IOHEXOL 300 MG/ML  SOLN
100.0000 mL | Freq: Once | INTRAMUSCULAR | Status: AC | PRN
  Administered 2021-09-05: 100 mL via INTRAVENOUS

## 2021-09-05 MED ORDER — IBUPROFEN 600 MG PO TABS: 600.0000 mg | ORAL_TABLET | Freq: Three times a day (TID) | ORAL | 0 refills | Status: DC | PRN

## 2021-09-05 MED ORDER — ACETAMINOPHEN 500 MG PO TABS
1000.0000 mg | ORAL_TABLET | Freq: Once | ORAL | Status: AC
  Administered 2021-09-05: 1000 mg via ORAL
  Filled 2021-09-05: qty 2

## 2021-09-05 NOTE — Discharge Instructions (Addendum)
Please seek medical attention for any high fevers, chest pain, shortness of breath, change in behavior, persistent vomiting, bloody stool or any other new or concerning symptoms.  

## 2021-09-05 NOTE — ED Notes (Signed)
MD at the bedside  

## 2021-09-05 NOTE — ED Provider Notes (Signed)
[facility] Provider Note    Event Date/Time   First MD Initiated Contact with Patient 09/05/21 1853     (approximate)   History   Abdominal Pain   HPI  Blake [name] is a 66 y.o. male  who, per clinic note dated 08/15/21 has history of dementia, CHF, CAD, who presents to the emergency department today because of concern for abdominal pain. The patient states that over the past three weeks he has noticed swelling to his right groin that would be worse with coughing. Today he started developing pain with this. The patient denies any change in bowel habits.  Denies any nausea or vomiting.     Physical Exam   Triage Vital Signs: ED Triage Vitals  Enc Vitals Group     BP 09/05/21 1630 (!) 140/96     Pulse Rate 09/05/21 1630 80     Resp 09/05/21 1630 (!) 22     Temp 09/05/21 1630 97.8 F (36.6 C)     Temp Source 09/05/21 1630 Oral     SpO2 09/05/21 1630 94 %     Weight 09/05/21 1632 160 lb (72.6 kg)     Height 09/05/21 1632 5\' 3"  (1.6 m)     Head Circumference --      Peak Flow --      Pain Score 09/05/21 1632 10     Pain Loc --      Pain Edu? --      Excl. in [place]? --     Most recent vital signs: Vitals:   09/05/21 1630  BP: (!) 140/96  Pulse: 80  Resp: (!) 22  Temp: 97.8 F (36.6 C)  SpO2: 94%    General: Awake, no distress.  CV:  Good peripheral perfusion.  Resp:  Normal effort.  Abd:  No distention. Mild swelling in right inguinal region, reducible. Non tender. No overlying skin changes.     ED Results / Procedures / Treatments   Labs (all labs ordered are listed, but only abnormal results are displayed) Labs Reviewed  COMPREHENSIVE METABOLIC PANEL - Abnormal; Notable for the following components:      Result Value   Glucose, Bld 126 (*)    Total Protein 8.3 (*)    All other components within normal limits  URINALYSIS, ROUTINE W REFLEX MICROSCOPIC - Abnormal; Notable for the following components:   Color, Urine YELLOW (*)     APPearance CLEAR (*)    Leukocytes,Ua TRACE (*)    Bacteria, UA RARE (*)    All other components within normal limits  CBC WITH DIFFERENTIAL/PLATELET  LIPASE, BLOOD     EKG  None   RADIOLOGY CT abd/pel I independently interpreted and visualized the ct abd/pel. My interpretation: No free air. Radiology interpretation:  IMPRESSION:  1. Bilateral inguinal hernias containing a small amount of colon on  the right and a small amount of bladder on the left.  2. Severe fecal loading throughout the colon. The appendix is not  visualized but there is no secondary evidence of appendicitis.  3. Atherosclerotic change in the aorta.  4. 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      PROCEDURES:  Critical Care performed: No  Procedures   MEDICATIONS ORDERED IN ED: Medications  acetaminophen (TYLENOL) tablet 1,000 mg (1,000 mg Oral Given 09/05/21 1638)  iohexol (OMNIPAQUE) 300 MG/ML solution 100 mL (100 mLs Intravenous Contrast Given 09/05/21 1708)     IMPRESSION /  MDM / ASSESSMENT AND PLAN / ED COURSE  I reviewed the triage vital signs and the nursing notes.                              Differential diagnosis includes, but is not limited to, hernia, infection.  Patient presented to the emergency department today because of concern for abdominal pain. Patient does describe swelling to right inguinal area. CT scan ordered from triage is consistent with hernia. At this time not clinically consistent with strangulation or incarceration, thus do not feel emergent surgical consultation or admission is necessary at this time. Will however have patient follow up with surgery. Discussed care and return precautions.    FINAL CLINICAL IMPRESSION(S) / ED DIAGNOSES   Final diagnoses:  Inguinal hernia without obstruction or gangrene, recurrence not specified, unspecified laterality     Rx / DC Orders   ED Discharge Orders          Ordered     ibuprofen (ADVIL) 600 MG tablet  Every 8 hours PRN        09/05/21 2148             Note:  This document was prepared using Dragon voice recognition software and may include unintentional dictation errors.    Nance Pear, MD 09/05/21 2149

## 2021-09-05 NOTE — ED Provider Triage Note (Signed)
°  Emergency Medicine Provider Triage Evaluation Note  Blake [name] , a 66 y.o.male,  was evaluated in triage.  Pt complains of lower abdominal pain for the past 2 weeks.  Patient reports a knot sensation, particularly in the right lower quadrant/groin area.  Denies urinary symptoms.   Review of Systems  Positive: Abdominal pain Negative: Denies fever, chest pain, vomiting  Physical Exam   Vitals:   09/05/21 1630  BP: (!) 140/96  Pulse: 80  Resp: (!) 22  Temp: 97.8 F (36.6 C)  SpO2: 94%   Gen:   Awake, no distress   Resp:  Normal effort  MSK:   Moves extremities without difficulty  Other:    Medical Decision Making  Given the patient's initial medical screening exam, the following diagnostic evaluation has been ordered. The patient will be placed in the appropriate treatment space, once one is available, to complete the evaluation and treatment. I have discussed the plan of care with the patient and I have advised the patient that an ED physician or mid-level practitioner will reevaluate their condition after the test results have been received, as the results may give them additional insight into the type of treatment they may need.    Diagnostics: Labs, UA, abdominal CT  Treatments: Acetaminophen.   Varney Daily, [place] 09/05/21 [phone]

## 2021-09-05 NOTE — ED Notes (Signed)
Report received Tresa Endo, RN

## 2021-09-05 NOTE — ED Triage Notes (Addendum)
Patient c/o lower abdominal pain and groin pain. Patient has congestion and runny nose currently

## 2021-09-05 NOTE — ED Notes (Signed)
Discharge instructions reviewed with pt & wife. No questions at this time.

## 2021-09-15 ENCOUNTER — Ambulatory Visit: Payer: Medicare Other

## 2021-09-21 ENCOUNTER — Ambulatory Visit: Payer: Medicare Other

## 2021-10-04 ENCOUNTER — Telehealth: Payer: Self-pay | Admitting: Internal Medicine

## 2021-10-04 NOTE — Telephone Encounter (Signed)
Patients wife dropped off a request for independent assessment  for personal care  application that needs to be completed by the provider. Patient has an appointment on Wed 4/5. Application was placed in the provider folder.Please complete and fax back. ?

## 2021-10-04 NOTE — Telephone Encounter (Signed)
Needs appt for this pl thnx

## 2021-10-05 NOTE — Telephone Encounter (Signed)
Pl WI pt today or tommorow for paperowrk appt on 4/5 is his regular visit thnx.

## 2021-10-05 NOTE — Telephone Encounter (Signed)
Thnx

## 2021-10-05 NOTE — Telephone Encounter (Signed)
Scheduled for tomorrow at 1120

## 2021-10-06 ENCOUNTER — Ambulatory Visit: Payer: Medicare Other | Admitting: Internal Medicine

## 2021-10-12 ENCOUNTER — Ambulatory Visit: Payer: Medicare Other | Admitting: Internal Medicine

## 2021-11-15 ENCOUNTER — Encounter: Payer: Self-pay | Admitting: Gastroenterology

## 2021-11-15 ENCOUNTER — Ambulatory Visit: Payer: Medicare Other | Admitting: Gastroenterology

## 2022-01-13 ENCOUNTER — Emergency Department
Admission: EM | Admit: 2022-01-13 | Discharge: 2022-01-13 | Disposition: A | Discharge status: Home / Self Care | Payer: Medicare Other | Attending: Emergency Medicine | Admitting: Emergency Medicine

## 2022-01-13 ENCOUNTER — Other Ambulatory Visit: Payer: Self-pay

## 2022-01-13 ENCOUNTER — Emergency Department: Payer: Medicare Other

## 2022-01-13 DIAGNOSIS — J449 Chronic obstructive pulmonary disease, unspecified: Secondary | ICD-10-CM | POA: Insufficient documentation

## 2022-01-13 DIAGNOSIS — S79912A Unspecified injury of left hip, initial encounter: Secondary | ICD-10-CM | POA: Diagnosis present

## 2022-01-13 DIAGNOSIS — I110 Hypertensive heart disease with heart failure: Secondary | ICD-10-CM | POA: Diagnosis not present

## 2022-01-13 DIAGNOSIS — I2510 Atherosclerotic heart disease of native coronary artery without angina pectoris: Secondary | ICD-10-CM | POA: Insufficient documentation

## 2022-01-13 DIAGNOSIS — Y92410 Unspecified street and highway as the place of occurrence of the external cause: Secondary | ICD-10-CM | POA: Diagnosis not present

## 2022-01-13 DIAGNOSIS — S0990XA Unspecified injury of head, initial encounter: Secondary | ICD-10-CM | POA: Insufficient documentation

## 2022-01-13 DIAGNOSIS — I5022 Chronic systolic (congestive) heart failure: Secondary | ICD-10-CM | POA: Insufficient documentation

## 2022-01-13 DIAGNOSIS — S70212A Abrasion, left hip, initial encounter: Secondary | ICD-10-CM | POA: Diagnosis not present

## 2022-01-13 DIAGNOSIS — F17200 Nicotine dependence, unspecified, uncomplicated: Secondary | ICD-10-CM | POA: Diagnosis not present

## 2022-01-13 DIAGNOSIS — M25552 Pain in left hip: Secondary | ICD-10-CM

## 2022-01-13 DIAGNOSIS — Z951 Presence of aortocoronary bypass graft: Secondary | ICD-10-CM | POA: Diagnosis not present
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
## 2022-01-13 NOTE — Discharge Instructions (Addendum)
Your x-rays of the hip did not show any broken bones.  Your CAT scans of your head and neck did also not show any acute injury.  Take Tylenol as needed for pain.
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
## 2022-02-16 ENCOUNTER — Ambulatory Visit (INDEPENDENT_AMBULATORY_CARE_PROVIDER_SITE_OTHER): Payer: Medicare Other | Admitting: *Deleted

## 2022-02-16 DIAGNOSIS — Z0000 Encounter for general adult medical examination without abnormal findings: Secondary | ICD-10-CM

## 2022-02-16 NOTE — Progress Notes (Signed)
Subjective:   Jackey Frerking is a 66 y.o. male who presents for Medicare Annual/Subsequent preventive examination.  I connected with  Rihan Supple on 02/16/22 by a telephone enabled telemedicine application and verified that I am speaking with the correct person using two identifiers.   I discussed the limitations of evaluation and management by telemedicine. The patient expressed understanding and agreed to proceed.  Patient location: home  Provider location:  Tele-health-home    Review of Systems     Cardiac Risk Factors include: advanced age (>68men, >55 women);male gender;smoking/ tobacco exposure;sedentary lifestyle;hypertension     Objective:    Today's Vitals   There is no height or weight on file to calculate BMI.     02/16/2022    9:07 AM 09/05/2021    4:34 PM 08/09/2021    8:47 AM 07/25/2021   12:01 PM 06/30/2021    9:57 AM 03/03/2021   10:32 AM 01/30/2021    8:13 AM  Advanced Directives  Does Patient Have a Medical Advance Directive? No No No No No No No  Would patient like information on creating a medical advance directive? No - Patient declined No - Patient declined No - Patient declined  No - Patient declined  No - Patient declined    Current Medications (verified) Outpatient Encounter Medications as of 02/16/2022  Medication Sig   albuterol (VENTOLIN HFA) 108 (90 Base) MCG/ACT inhaler Inhale 2 puffs into the lungs every 6 (six) hours as needed for wheezing or shortness of breath.   aspirin 81 MG chewable tablet Chew 1 tablet (81 mg total) by mouth daily.   ibuprofen (ADVIL) 600 MG tablet Take 1 tablet (600 mg total) by mouth every 8 (eight) hours as needed.   lisinopril (ZESTRIL) 2.5 MG tablet Take 2.5 mg by mouth daily.   loratadine (CLARITIN) 10 MG tablet Take 1 tablet (10 mg total) by mouth daily.   nitroGLYCERIN (NITROSTAT) 0.4 MG SL tablet Place 1 tablet (0.4 mg total) under the tongue every 5 (five) minutes as needed for chest pain. Reported on  09/29/2015   omeprazole (PRILOSEC) 20 MG capsule Take 20 mg by mouth daily.   potassium chloride SA (KLOR-CON) 20 MEQ tablet Take 1 tablet (20 mEq total) by mouth daily.   predniSONE (STERAPRED UNI-PAK 21 TAB) 10 MG (21) TBPK tablet Take 6 pills on day one then decrease by 1 pill each day   umeclidinium-vilanterol (ANORO ELLIPTA) 62.5-25 MCG/ACT AEPB Inhale 1 puff into the lungs daily at 6 (six) AM.   carvedilol (COREG) 3.125 MG tablet Take by mouth.   furosemide (LASIX) 20 MG tablet Take 1 tablet (20 mg total) by mouth daily.   No facility-administered encounter medications on file as of 02/16/2022.    Allergies (verified) Patient has no known allergies.   History: Past Medical History:  Diagnosis Date   Acute headache    CHF (congestive heart failure) (HCC)    COPD (chronic obstructive pulmonary disease) ([place])    Coronary artery disease    a. cath 2009 w/ severe 3-vessel dz, rec bypass; b. MI 2012-->4-vessel cabg; c. cath 2016: occluded VG-OM and VG-RPDA, patent LIMA-LAD and patent VG-Diag, 95% stenosis of mid LCx s/p PCI/DES 0% residual stenosis   Depression    Hepatitis B    Hepatitis C    HFrEF (heart failure with reduced ejection fraction) ([place])    a. LV gram 2009 with EF 32% with anterolateral and apical HK   Hyperlipidemia    Hypertension  MI (myocardial infarction) (HCC)    x 2   Tobacco abuse    Past Surgical History:  Procedure Laterality Date   CARDIAC CATHETERIZATION     CORONARY ANGIOPLASTY WITH STENT PLACEMENT  2009   CORONARY ARTERY BYPASS GRAFT  2012   [facility]   KNEE SURGERY     right knee    LEFT HEART CATH AND CORS/GRAFTS ANGIOGRAPHY N/A 06/09/2019   Procedure: LEFT HEART CATH AND CORS/GRAFTS ANGIOGRAPHY;  Surgeon: Iran Ouch, MD;  Location: [facility] INVASIVE CV LAB;  Service: Cardiovascular;  Laterality: N/A;   Family History  Problem Relation Age of Onset   Hypertension Mother    Hypertension Father    Hypertension Sister    Hypertension Sister     Social History   Socioeconomic History   Marital status: Single    Spouse name: Not on file   Number of children: Not on file   Years of education: Not on file   Highest education level: Not on file  Occupational History   Not on file  Tobacco Use   Smoking status: Every Day    Packs/day: 1.50    Years: 42.00    Total pack years: 63.00    Types: Cigarettes   Smokeless tobacco: Never  Vaping Use   Vaping Use: Never used  Substance and Sexual Activity   Alcohol use: No   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   Sexual activity: Not Currently  Other Topics Concern   Not on file  Social History Narrative   Not on file   Social Determinants of Health   Financial Resource Strain: Low Risk  (02/16/2022)   Overall Financial Resource Strain (CARDIA)    Difficulty of Paying Living Expenses: Not very hard  Food Insecurity: No Food Insecurity (02/16/2022)   Hunger Vital Sign    Worried About Running Out of Food in the Last Year: Never true    Ran Out of Food in the Last Year: Never true  Transportation Needs: No Transportation Needs (02/16/2022)   PRAPARE - Administrator, Civil Service (Medical): No    Lack of Transportation (Non-Medical): No  Physical Activity: Inactive (02/16/2022)   Exercise Vital Sign    Days of Exercise per Week: 0 days    Minutes of Exercise per Session: 0 min  Stress: No Stress Concern Present (02/16/2022)   [employer] of Occupational Health - Occupational Stress Questionnaire    Feeling of Stress : Not at all  Social Connections: Socially Isolated (02/16/2022)   Social Connection and Isolation Panel [NHANES]    Frequency of Communication with Friends and Family: Three times a week    Frequency of Social Gatherings with Friends and Family: Three times a week    Attends Religious Services: Never    Active Member of Clubs or Organizations: No    Attends Banker Meetings: Never    Marital Status: Separated    Tobacco  Counseling Ready to quit: Not Answered Counseling given: Not Answered   Clinical Intake:  Pre-visit preparation completed: Yes  Pain : No/denies pain     Diabetes: No  How often do you need to have someone help you when you read instructions, pamphlets, or other written materials from your doctor or pharmacy?: 3 - Sometimes  Diabetic?  no  Interpreter Needed?: No  Information entered by :: Remi Haggard  LPN   Activities of Daily Living    02/16/2022    9:15 AM  05/23/2021    1:09 PM  In your present state of health, do you have any difficulty performing the following activities:  Hearing? 1 1  Vision? 0 0  Difficulty concentrating or making decisions? 0 0  Walking or climbing stairs? 1 0  Dressing or bathing? 0 0  Doing errands, shopping? 1 0  Preparing Food and eating ? N   Using the Toilet? N   In the past six months, have you accidently leaked urine? N   Do you have problems with loss of bowel control? N   Managing your Medications? N   Managing your Finances? N   Housekeeping or managing your Housekeeping? N     Patient Care Team: Loura Pardon, MD as PCP - General (Internal Medicine) Antonieta Iba, MD as PCP - Cardiology (Cardiology)  Indicate any recent Medical Services you may have received from other than [facility] providers in the past year (date may be approximate).     Assessment:   This is a routine wellness examination for Lewis.  Hearing/Vision screen Hearing Screening - Comments:: Does not wear hearing aids Some trouble hearing Vision Screening - Comments:: Not up to date  Dietary issues and exercise activities discussed: Current Exercise Habits: The patient does not participate in regular exercise at present   Goals Addressed             This Visit's Progress    Patient Stated       No goals       Depression Screen    02/16/2022    9:12 AM 09/04/2018    1:55 PM  PHQ 2/9 Scores  PHQ - 2 Score 0 0    Fall Risk    02/16/2022     9:07 AM 08/15/2021    1:15 PM 05/23/2021    1:09 PM  Fall Risk   Falls in the past year? 1  0  Number falls in past yr: 1 1 0  Injury with Fall? 0 1 0  Risk for fall due to :  History of fall(s)   Follow up Falls evaluation completed;Education provided;Falls prevention discussed Falls evaluation completed Falls evaluation completed    FALL RISK PREVENTION PERTAINING TO THE HOME:  Any stairs in or around the home? Yes  If so, are there any without handrails? No  Home free of loose throw rugs in walkways, pet beds, electrical cords, etc? Yes  Adequate lighting in your home to reduce risk of falls? Yes   ASSISTIVE DEVICES UTILIZED TO PREVENT FALLS:  Life alert? No  Use of a cane, walker or w/c? No  Grab bars in the bathroom? No  Shower chair or bench in shower? No  Elevated toilet seat or a handicapped toilet? No   TIMED UP AND GO:  Was the test performed? No .    Cognitive Function:        02/16/2022    9:08 AM  6CIT Screen  What Year? 4 points  What month? 3 points  What time? 0 points  Count back from 20 4 points  Months in reverse 4 points  Repeat phrase 10 points  Total Score 25 points    Immunizations Immunization History  Administered Date(s) Administered   Tdap 06/01/2015    TDAP status: Up to date  Flu Vaccine status: Due, Education has been provided regarding the importance of this vaccine. Advised may receive this vaccine at local pharmacy or Health Dept. Aware to provide a copy of the vaccination record if  obtained from local pharmacy or Health Dept. Verbalized acceptance and understanding.  Pneumococcal vaccine status: Due, Education has been provided regarding the importance of this vaccine. Advised may receive this vaccine at local pharmacy or Health Dept. Aware to provide a copy of the vaccination record if obtained from local pharmacy or Health Dept. Verbalized acceptance and understanding.  Covid-19 vaccine status: Information provided on how  to obtain vaccines.   Qualifies for Shingles Vaccine? Yes   Zostavax completed No   Shingrix Completed?: No.    Education has been provided regarding the importance of this vaccine. Patient has been advised to call insurance company to determine out of pocket expense if they have not yet received this vaccine. Advised may also receive vaccine at local pharmacy or Health Dept. Verbalized acceptance and understanding.  Screening Tests Health Maintenance  Topic Date Due   INFLUENZA VACCINE  02/07/2022   COVID-19 Vaccine (1) 03/04/2022 (Originally 04/28/1961)   Zoster Vaccines- Shingrix (1 of 2) 05/19/2022 (Originally [birth year]27/1973)   Pneumonia Vaccine 77+ Years old (1 - PCV) 08/15/2022 (Originally 04/27/1962)   COLONOSCOPY (Pts 45-35yrs Insurance coverage will need to be confirmed)  08/15/2022 (Originally 04/28/1999)   TETANUS/TDAP  05/31/2025   Hepatitis C Screening  Completed   HPV VACCINES  Aged Out    Health Maintenance  Health Maintenance Due  Topic Date Due   INFLUENZA VACCINE  02/07/2022  Colonoscopy patient declined at this time    Lung Cancer Screening: (Low Dose CT Chest recommended if Age 50-80 years, 30 pack-year currently smoking OR have quit w/in 15years.) does not qualify.   Lung Cancer Screening Referral:   Additional Screening:  Hepatitis C Screening: does not qualify; Completed 2019  Vision Screening: Recommended annual ophthalmology exams for early detection of glaucoma and other disorders of the eye. Is the patient up to date with their annual eye exam?  No  Who is the provider or what is the name of the office in which the patient attends annual eye exams?  If pt is not established with a provider, would they like to be referred to a provider to establish care? No .   Dental Screening: Recommended annual dental exams for proper oral hygiene  Community Resource Referral / Chronic Care Management: CRR required this visit?  No   CCM required this visit?  No       Plan:     I have personally reviewed and noted the following in the patient's chart:   Medical and social history Use of alcohol, tobacco or illicit drugs  Current medications and supplements including opioid prescriptions. Patient is not currently taking opioid prescriptions. Functional ability and status Nutritional status Physical activity Advanced directives List of other physicians Hospitalizations, surgeries, and ER visits in previous 12 months Vitals Screenings to include cognitive, depression, and falls Referrals and appointments  In addition, I have reviewed and discussed with patient certain preventive protocols, quality metrics, and best practice recommendations. A written personalized care plan for preventive services as well as general preventive health recommendations were provided to patient.     Leroy Kennedy, LPN   [id number]   Nurse Notes:

## 2022-02-16 NOTE — Patient Instructions (Signed)
Blake [name] , Thank you for taking time to come for your Medicare Wellness Visit. I appreciate your ongoing commitment to your health goals. Please review the following plan we discussed and let me know if I can assist you in the future.   Screening recommendations/referrals: Colonoscopy: Education provided Recommended yearly ophthalmology/optometry visit for glaucoma screening and checkup Recommended yearly dental visit for hygiene and checkup  Vaccinations: Influenza vaccine: Education provided Pneumococcal vaccine: Education provided Tdap vaccine: up to date Shingles vaccine: Education provided    Advanced directives: Education provided  Conditions/risks identified:    Preventive Care 65 Years and Older, Male Preventive care refers to lifestyle choices and visits with your health care provider that can promote health and wellness. What does preventive care include? A yearly physical exam. This is also called an annual well check. Dental exams once or twice a year. Routine eye exams. Ask your health care provider how often you should have your eyes checked. Personal lifestyle choices, including: Daily care of your teeth and gums. Regular physical activity. Eating a healthy diet. Avoiding tobacco and drug use. Limiting alcohol use. Practicing safe sex. Taking low doses of aspirin every day. Taking vitamin and mineral supplements as recommended by your health care provider. What happens during an annual well check? The services and screenings done by your health care provider during your annual well check will depend on your age, overall health, lifestyle risk factors, and family history of disease. Counseling  Your health care provider may ask you questions about your: Alcohol use. Tobacco use. Drug use. Emotional well-being. Home and relationship well-being. Sexual activity. Eating habits. History of falls. Memory and ability to understand (cognition). Work and  work Astronomer. Screening  You may have the following tests or measurements: Height, weight, and BMI. Blood pressure. Lipid and cholesterol levels. These may be checked every 5 years, or more frequently if you are over 16 years old. Skin check. Lung cancer screening. You may have this screening every year starting at age 64 if you have a 30-pack-year history of smoking and currently smoke or have quit within the past 15 years. Fecal occult blood test (FOBT) of the stool. You may have this test every year starting at age 77. Flexible sigmoidoscopy or colonoscopy. You may have a sigmoidoscopy every 5 years or a colonoscopy every 10 years starting at age 49. Prostate cancer screening. Recommendations will vary depending on your family history and other risks. Hepatitis C blood test. Hepatitis B blood test. Sexually transmitted disease (STD) testing. Diabetes screening. This is done by checking your blood sugar (glucose) after you have not eaten for a while (fasting). You may have this done every 1-3 years. Abdominal aortic aneurysm (AAA) screening. You may need this if you are a current or former smoker. Osteoporosis. You may be screened starting at age 7 if you are at high risk. Talk with your health care provider about your test results, treatment options, and if necessary, the need for more tests. Vaccines  Your health care provider may recommend certain vaccines, such as: Influenza vaccine. This is recommended every year. Tetanus, diphtheria, and acellular pertussis (Tdap, Td) vaccine. You may need a Td booster every 10 years. Zoster vaccine. You may need this after age 5. Pneumococcal 13-valent conjugate (PCV13) vaccine. One dose is recommended after age 55. Pneumococcal polysaccharide (PPSV23) vaccine. One dose is recommended after age 11. Talk to your health care provider about which screenings and vaccines you need and how often you need them. This  information is not intended to  replace advice given to you by your health care provider. Make sure you discuss any questions you have with your health care provider. Document Released: 07/23/2015 Document Revised: 03/15/2016 Document Reviewed: 04/27/2015 Elsevier Interactive Patient Education  2017 [place] Prevention in the Home Falls can cause injuries. They can happen to people of all ages. There are many things you can do to make your home safe and to help prevent falls. What can I do on the outside of my home? Regularly fix the edges of walkways and driveways and fix any cracks. Remove anything that might make you trip as you walk through a door, such as a raised step or threshold. Trim any bushes or trees on the path to your home. Use bright outdoor lighting. Clear any walking paths of anything that might make someone trip, such as rocks or tools. Regularly check to see if handrails are loose or broken. Make sure that both sides of any steps have handrails. Any raised decks and porches should have guardrails on the edges. Have any leaves, snow, or ice cleared regularly. Use sand or salt on walking paths during winter. Clean up any spills in your garage right away. This includes oil or grease spills. What can I do in the bathroom? Use night lights. Install grab bars by the toilet and in the tub and shower. Do not use towel bars as grab bars. Use non-skid mats or decals in the tub or shower. If you need to sit down in the shower, use a plastic, non-slip stool. Keep the floor dry. Clean up any water that spills on the floor as soon as it happens. Remove soap buildup in the tub or shower regularly. Attach bath mats securely with double-sided non-slip rug tape. Do not have throw rugs and other things on the floor that can make you trip. What can I do in the bedroom? Use night lights. Make sure that you have a light by your bed that is easy to reach. Do not use any sheets or blankets that are too big for  your bed. They should not hang down onto the floor. Have a firm chair that has side arms. You can use this for support while you get dressed. Do not have throw rugs and other things on the floor that can make you trip. What can I do in the kitchen? Clean up any spills right away. Avoid walking on wet floors. Keep items that you use a lot in easy-to-reach places. If you need to reach something above you, use a strong step stool that has a grab bar. Keep electrical cords out of the way. Do not use floor polish or wax that makes floors slippery. If you must use wax, use non-skid floor wax. Do not have throw rugs and other things on the floor that can make you trip. What can I do with my stairs? Do not leave any items on the stairs. Make sure that there are handrails on both sides of the stairs and use them. Fix handrails that are broken or loose. Make sure that handrails are as long as the stairways. Check any carpeting to make sure that it is firmly attached to the stairs. Fix any carpet that is loose or worn. Avoid having throw rugs at the top or bottom of the stairs. If you do have throw rugs, attach them to the floor with carpet tape. Make sure that you have a light switch at the top  of the stairs and the bottom of the stairs. If you do not have them, ask someone to add them for you. What else can I do to help prevent falls? Wear shoes that: Do not have high heels. Have rubber bottoms. Are comfortable and fit you well. Are closed at the toe. Do not wear sandals. If you use a stepladder: Make sure that it is fully opened. Do not climb a closed stepladder. Make sure that both sides of the stepladder are locked into place. Ask someone to hold it for you, if possible. Clearly mark and make sure that you can see: Any grab bars or handrails. First and last steps. Where the edge of each step is. Use tools that help you move around (mobility aids) if they are needed. These  include: Canes. Walkers. Scooters. Crutches. Turn on the lights when you go into a dark area. Replace any light bulbs as soon as they burn out. Set up your furniture so you have a clear path. Avoid moving your furniture around. If any of your floors are uneven, fix them. If there are any pets around you, be aware of where they are. Review your medicines with your doctor. Some medicines can make you feel dizzy. This can increase your chance of falling. Ask your doctor what other things that you can do to help prevent falls. This information is not intended to replace advice given to you by your health care provider. Make sure you discuss any questions you have with your health care provider. Document Released: 04/22/2009 Document Revised: 12/02/2015 Document Reviewed: 07/31/2014 Elsevier Interactive Patient Education  2017 [employer].

## 2022-05-14 ENCOUNTER — Other Ambulatory Visit: Payer: Self-pay

## 2022-05-14 ENCOUNTER — Emergency Department: Payer: Medicare Other

## 2022-05-14 ENCOUNTER — Encounter: Payer: Self-pay | Admitting: Nurse Practitioner

## 2022-05-14 ENCOUNTER — Emergency Department
Admission: EM | Admit: 2022-05-14 | Discharge: 2022-05-14 | Disposition: A | Discharge status: Home / Self Care | Payer: Medicare Other | Attending: Emergency Medicine | Admitting: Emergency Medicine

## 2022-05-14 DIAGNOSIS — I2510 Atherosclerotic heart disease of native coronary artery without angina pectoris: Secondary | ICD-10-CM | POA: Diagnosis not present

## 2022-05-14 DIAGNOSIS — R079 Chest pain, unspecified: Secondary | ICD-10-CM | POA: Insufficient documentation

## 2022-05-14 DIAGNOSIS — J449 Chronic obstructive pulmonary disease, unspecified: Secondary | ICD-10-CM | POA: Insufficient documentation

## 2022-05-14 DIAGNOSIS — F0390 Unspecified dementia without behavioral disturbance: Secondary | ICD-10-CM | POA: Insufficient documentation

## 2022-05-14 DIAGNOSIS — I509 Heart failure, unspecified: Secondary | ICD-10-CM | POA: Diagnosis not present

## 2022-05-14 LAB — CBC
HCT: 43.8 % (ref 39.0–52.0)
Hemoglobin: 14 g/dL (ref 13.0–17.0)
MCH: 29.9 pg (ref 26.0–34.0)
MCHC: 32 g/dL (ref 30.0–36.0)
MCV: 93.6 fL (ref 80.0–100.0)
Platelets: 218 10*3/uL (ref 150–400)
RBC: 4.68 MIL/uL (ref 4.22–5.81)
RDW: 13 % (ref 11.5–15.5)
WBC: 4.7 10*3/uL (ref 4.0–10.5)
nRBC: 0 % (ref 0.0–0.2)

## 2022-05-14 LAB — BASIC METABOLIC PANEL
Anion gap: 5 (ref 5–15)
BUN: 14 mg/dL (ref 8–23)
CO2: 29 mmol/L (ref 22–32)
Calcium: 8.9 mg/dL (ref 8.9–10.3)
Chloride: 107 mmol/L (ref 98–111)
Creatinine, Ser: 1.07 mg/dL (ref 0.61–1.24)
GFR, Estimated: >60 mL/min (ref >60)
Glucose, Bld: 92 mg/dL (ref 70–99)
Potassium: 3.6 mmol/L (ref 3.5–5.1)
Sodium: 141 mmol/L (ref 135–145)

## 2022-05-14 LAB — TROPONIN I (HIGH SENSITIVITY)
Troponin I (High Sensitivity): 29 ng/L — ABNORMAL HIGH (ref <18)
Troponin I (High Sensitivity): 33 ng/L — ABNORMAL HIGH (ref <18)

## 2022-05-14 NOTE — ED Provider Triage Note (Signed)
Emergency Medicine Provider Triage Evaluation Note  Abdullah Batiz, a 66 y.o. male  was evaluated in triage.  Pt complains of chest pain for 2 days.  Patient with a history of CAD status post CABG x2, presents with 2 days of central chest pain.  He would endorse being out of his medication for the last 3 to 4 days.  Denies any shortness of breath, cough, congestion, FCS.  Review of Systems  Positive: CP Negative: FCS  Physical Exam  There were no vitals taken for this visit. Gen:   Awake, no distress  NAD Resp:  Normal effort CTA MSK:   Moves extremities without difficulty  CVS:  RRR  Medical Decision Making  Medically screening exam initiated at 6:18 PM.  Appropriate orders placed.  Veer Bessent was informed that the remainder of the evaluation will be completed by another provider, this initial triage assessment does not replace that evaluation, and the importance of remaining in the ED until their evaluation is complete.  Patient to the ED for evaluation of 2 days of central chest pain.  He denies any associated shortness breath, cough, or congestion.  Patient would endorse has been without his home medications for the last 3 to 4 days.   Melvenia Needles, PA-C 05/14/22 1819

## 2022-05-14 NOTE — ED Triage Notes (Signed)
Pt to ED for chest pain mid chest aching 4/10 since 2 days. Hx MI X 2 and CABG. Has not taken blood thinners since several days. Denies SOB, dizziness and other complaints.  EKG shown to provider.  Blue top sent with other labs.

## 2022-05-14 NOTE — Discharge Instructions (Signed)
Follow-up with your primary care doctor and with Dr. Clayborn Bigness.  Take your normal medications as prescribed.  Return to the ER for new, worsening, or persistent severe chest pain, difficulty breathing, weakness or lightheadedness, or any other new or worsening symptoms that concern you.

## 2022-05-14 NOTE — ED Provider Notes (Signed)
[facility] Provider Note    Event Date/Time   First MD Initiated Contact with Patient 05/14/22 2000     (approximate)   History   Chest Pain   HPI  Blake [name] is a 66 y.o. male with a history of CHF, COPD, and CAD (with remote history of MI) who presents with chest pain over the last 2 days, persistent course, nonexertional, and not associated with shortness of breath, lightheadedness, nausea or vomiting.  He states that the pain has now resolved since he has been waiting in the ED.  I reviewed the past medical records.  His last outpatient encounter was on 02/16/2022 for an annual exam.  It appears that he follows with cardiology at [facility] clinic although has not had any recent visits.   Physical Exam   Triage Vital Signs: ED Triage Vitals  Enc Vitals Group     BP 05/14/22 1821 (!) 159/81     Pulse Rate 05/14/22 1821 68     Resp 05/14/22 1821 18     Temp 05/14/22 1821 97.8 F (36.6 C)     Temp Source 05/14/22 1821 Oral     SpO2 05/14/22 1821 98 %     Weight 05/14/22 1823 150 lb (68 kg)     Height 05/14/22 1823 5\' 6"  (1.676 m)     Head Circumference --      Peak Flow --      Pain Score 05/14/22 1823 4     Pain Loc --      Pain Edu? --      Excl. in GC? --     Most recent vital signs: Vitals:   05/14/22 2143 05/14/22 2334  BP: (!) 167/78 (!) 162/80  Pulse: 68 70  Resp: 15 16  Temp: 98 F (36.7 C) 98.2 F (36.8 C)  SpO2: 100% 100%     General: Alert, no distress.  CV:  Good peripheral perfusion.  Normal heart sounds. Resp:  Normal effort.  Lungs CTAB. Abd:  No distention.  Other:  No peripheral edema.   ED Results / Procedures / Treatments   Labs (all labs ordered are listed, but only abnormal results are displayed) Labs Reviewed  TROPONIN I (HIGH SENSITIVITY) - Abnormal; Notable for the following components:      Result Value   Troponin I (High Sensitivity) 29 (*)    All other components within normal limits   TROPONIN I (HIGH SENSITIVITY) - Abnormal; Notable for the following components:   Troponin I (High Sensitivity) 33 (*)    All other components within normal limits  BASIC METABOLIC PANEL  CBC     EKG  ED ECG REPORT I, Dionne Bucy, the attending physician, personally viewed and interpreted this ECG.  Date: 05/14/2022 EKG Time: 1816 Rate: 70 Rhythm: normal sinus rhythm QRS Axis: Right axis Intervals: normal ST/T Wave abnormalities: Diffuse T wave inversions with lateral ST depressions Narrative Interpretation: Nonspecific findings with overall similar morphology compared to most recent EKG from 08/09/2021 with possibly slightly more prominent ST depressions laterally   ED ECG REPORT I, Dionne Bucy, the attending physician, personally viewed and interpreted this ECG.  Date: 05/14/2022 EKG Time: 2131 Rate: 61 Rhythm: normal sinus rhythm QRS Axis: Right axis Intervals: normal ST/T Wave abnormalities: LVH with repolarization abnormality, diffuse T wave inversions Narrative Interpretation: no evidence of acute ischemia; no dynamic changes and morphology identical to prior EKGs     RADIOLOGY  Chest x-ray: I independently viewed and interpreted  the images; there is no focal consolidation or edema  PROCEDURES:  Critical Care performed: No  Procedures   MEDICATIONS ORDERED IN ED: Medications - No data to display   IMPRESSION / MDM / [place] / ED COURSE  I reviewed the triage vital signs and the nursing notes.  66 year old male with a history of CAD and other PMH as noted above presents with nonexertional chest pain over the last 2 days which has now resolved.  Zickel exam is unremarkable.  Initial EKG showed possibly slightly worsened ST depression laterally although the patient has a baseline very abnormal looking EKG.  Differential diagnosis includes, but is not limited to, ACS, stable angina, GERD, musculoskeletal pain, radiculopathy.  I do  not suspect PE given that the pain is spontaneously resolved and the lack of tachycardia or hypoxia.  The patient has no DVT symptoms.  There is no evidence of aortic dissection or other vascular cause.  Initial troponin is minimally elevated although this appears to be the patient's baseline.  We will obtain a repeat troponin and repeat EKG.  Patient's presentation is most consistent with acute presentation with potential threat to life or bodily function.  The patient is on the cardiac monitor to evaluate for evidence of arrhythmia and/or significant heart rate changes.  ----------------------------------------- 11:19 PM on 05/14/2022 -----------------------------------------  Repeat EKG shows no ST depressions and overall similar morphology to multiple prior EKGs.  Repeat troponin shows no significant rise and is consistent with the patient's baseline.  The patient continues to be chest pain-free.  Given his CAD history I did consider whether the patient may benefit from admission, however he is asymptomatic and given the negative work-up there is no evidence of ACS at this time and I feel that discharge with close outpatient follow-up is appropriate.  The patient himself strongly prefers to go home.  He is stable for discharge at this time.  I counseled him on the results of the work-up.  Return precautions given, and he expressed understanding.  He agrees to follow-up with Dr. Clayborn Bigness.    FINAL CLINICAL IMPRESSION(S) / ED DIAGNOSES   Final diagnoses:  Nonspecific chest pain     Rx / DC Orders   ED Discharge Orders     None        Note:  This document was prepared using Dragon voice recognition software and may include unintentional dictation errors.   Arta Silence, MD 05/15/22 0009

## 2022-05-14 NOTE — ED Provider Notes (Signed)
-----------------------------------------   6:22 PM on 05/14/2022 -----------------------------------------  EKG shows old deep T wave inversions in the lateral leads although possibly new ST depression when compared to prior.  It does not meet STEMI criteria.  The patient is presenting with chest pain.  I asked for troponin to be drawn and for him to be brought back to the next available bed and placed on the monitor.   Arta Silence, MD 05/14/22 [phone]

## 2022-05-15 ENCOUNTER — Telehealth: Payer: Self-pay

## 2022-05-15 NOTE — Telephone Encounter (Signed)
Transition Care Management Unsuccessful Follow-up Telephone Call  Date of discharge and from where:  05/14/22  Attempts:  1st Attempt  Reason for unsuccessful TCM follow-up call:  Unable to reach patient.

## 2022-05-20 NOTE — Patient Instructions (Incomplete)

## 2022-05-22 ENCOUNTER — Ambulatory Visit: Payer: Medicare Other | Admitting: Nurse Practitioner

## 2022-05-22 DIAGNOSIS — R739 Hyperglycemia, unspecified: Secondary | ICD-10-CM

## 2022-05-22 DIAGNOSIS — F17220 Nicotine dependence, chewing tobacco, uncomplicated: Secondary | ICD-10-CM

## 2022-05-22 DIAGNOSIS — I5022 Chronic systolic (congestive) heart failure: Secondary | ICD-10-CM

## 2022-05-22 DIAGNOSIS — N138 Other obstructive and reflux uropathy: Secondary | ICD-10-CM

## 2022-05-22 DIAGNOSIS — F330 Major depressive disorder, recurrent, mild: Secondary | ICD-10-CM

## 2022-05-22 DIAGNOSIS — F79 Unspecified intellectual disabilities: Secondary | ICD-10-CM

## 2022-05-22 DIAGNOSIS — I10 Essential (primary) hypertension: Secondary | ICD-10-CM

## 2022-05-22 DIAGNOSIS — F0390 Unspecified dementia without behavioral disturbance: Secondary | ICD-10-CM

## 2022-05-22 DIAGNOSIS — J432 Centrilobular emphysema: Secondary | ICD-10-CM

## 2022-05-22 DIAGNOSIS — E7800 Pure hypercholesterolemia, unspecified: Secondary | ICD-10-CM

## 2022-05-22 DIAGNOSIS — I25118 Atherosclerotic heart disease of native coronary artery with other forms of angina pectoris: Secondary | ICD-10-CM

## 2022-07-25 ENCOUNTER — Emergency Department
Admission: EM | Admit: 2022-07-25 | Discharge: 2022-07-25 | Disposition: A | Discharge status: Home / Self Care | Payer: Medicare Other | Attending: Emergency Medicine | Admitting: Emergency Medicine

## 2022-07-25 ENCOUNTER — Emergency Department: Payer: Medicare Other

## 2022-07-25 DIAGNOSIS — Z79899 Other long term (current) drug therapy: Secondary | ICD-10-CM | POA: Diagnosis not present

## 2022-07-25 DIAGNOSIS — Z951 Presence of aortocoronary bypass graft: Secondary | ICD-10-CM | POA: Insufficient documentation

## 2022-07-25 DIAGNOSIS — I110 Hypertensive heart disease with heart failure: Secondary | ICD-10-CM | POA: Insufficient documentation

## 2022-07-25 DIAGNOSIS — I2510 Atherosclerotic heart disease of native coronary artery without angina pectoris: Secondary | ICD-10-CM | POA: Insufficient documentation

## 2022-07-25 DIAGNOSIS — I509 Heart failure, unspecified: Secondary | ICD-10-CM | POA: Diagnosis not present

## 2022-07-25 DIAGNOSIS — J441 Chronic obstructive pulmonary disease with (acute) exacerbation: Secondary | ICD-10-CM | POA: Insufficient documentation

## 2022-07-25 DIAGNOSIS — R079 Chest pain, unspecified: Secondary | ICD-10-CM | POA: Diagnosis present

## 2022-07-25 DIAGNOSIS — Z1152 Encounter for screening for COVID-19: Secondary | ICD-10-CM | POA: Diagnosis not present

## 2022-07-25 DIAGNOSIS — Z7951 Long term (current) use of inhaled steroids: Secondary | ICD-10-CM | POA: Insufficient documentation

## 2022-07-25 DIAGNOSIS — Z7982 Long term (current) use of aspirin: Secondary | ICD-10-CM | POA: Diagnosis not present

## 2022-07-25 LAB — CBC WITH DIFFERENTIAL/PLATELET
Abs Immature Granulocytes: 0.01 10*3/uL (ref 0.00–0.07)
Basophils Absolute: 0.1 10*3/uL (ref 0.0–0.1)
Basophils Relative: 1 %
Eosinophils Absolute: 0.1 10*3/uL (ref 0.0–0.5)
Eosinophils Relative: 2 %
HCT: 43.8 % (ref 39.0–52.0)
Hemoglobin: 13.8 g/dL (ref 13.0–17.0)
Immature Granulocytes: 0 %
Lymphocytes Relative: 25 %
Lymphs Abs: 1.3 10*3/uL (ref 0.7–4.0)
MCH: 30.1 pg (ref 26.0–34.0)
MCHC: 31.5 g/dL (ref 30.0–36.0)
MCV: 95.4 fL (ref 80.0–100.0)
Monocytes Absolute: 0.9 10*3/uL (ref 0.1–1.0)
Monocytes Relative: 17 %
Neutro Abs: 3 10*3/uL (ref 1.7–7.7)
Neutrophils Relative %: 55 %
Platelets: 231 10*3/uL (ref 150–400)
RBC: 4.59 MIL/uL (ref 4.22–5.81)
RDW: 12.7 % (ref 11.5–15.5)
WBC: 5.4 10*3/uL (ref 4.0–10.5)
nRBC: 0 % (ref 0.0–0.2)

## 2022-07-25 LAB — COMPREHENSIVE METABOLIC PANEL
ALT: 16 U/L (ref 0–44)
AST: 28 U/L (ref 15–41)
Albumin: 3.5 g/dL (ref 3.5–5.0)
Alkaline Phosphatase: 86 U/L (ref 38–126)
Anion gap: 7 (ref 5–15)
BUN: 21 mg/dL (ref 8–23)
CO2: 26 mmol/L (ref 22–32)
Calcium: 8.9 mg/dL (ref 8.9–10.3)
Chloride: 105 mmol/L (ref 98–111)
Creatinine, Ser: 0.98 mg/dL (ref 0.61–1.24)
GFR, Estimated: >60 mL/min (ref >60)
Glucose, Bld: 94 mg/dL (ref 70–99)
Potassium: 3.9 mmol/L (ref 3.5–5.1)
Sodium: 138 mmol/L (ref 135–145)
Total Bilirubin: 0.9 mg/dL (ref 0.3–1.2)
Total Protein: 8 g/dL (ref 6.5–8.1)

## 2022-07-25 LAB — RESP PANEL BY RT-PCR (RSV, FLU A&B, COVID)  RVPGX2
Influenza A by PCR: NEGATIVE
Influenza B by PCR: NEGATIVE
Resp Syncytial Virus by PCR: NEGATIVE
SARS Coronavirus 2 by RT PCR: NEGATIVE

## 2022-07-25 LAB — TROPONIN I (HIGH SENSITIVITY)
Troponin I (High Sensitivity): 41 ng/L — ABNORMAL HIGH (ref <18)
Troponin I (High Sensitivity): 40 ng/L — ABNORMAL HIGH (ref <18)

## 2022-07-25 LAB — BRAIN NATRIURETIC PEPTIDE: B Natriuretic Peptide: 319.5 pg/mL — ABNORMAL HIGH (ref 0.0–100.0)

## 2022-07-25 MED ORDER — FUROSEMIDE 10 MG/ML IJ SOLN
40.0000 mg | Freq: Once | INTRAMUSCULAR | Status: AC
  Administered 2022-07-25: 40 mg via INTRAVENOUS
  Filled 2022-07-25: qty 4

## 2022-07-25 MED ORDER — PREDNISONE 20 MG PO TABS: 60.0000 mg | ORAL_TABLET | Freq: Every day | ORAL | 0 refills | Status: DC

## 2022-07-25 MED ORDER — FUROSEMIDE 20 MG PO TABS: 40.0000 mg | ORAL_TABLET | Freq: Every day | ORAL | 0 refills | Status: DC

## 2022-07-25 MED ORDER — ALBUTEROL SULFATE HFA 108 (90 BASE) MCG/ACT IN AERS: 2.0000 | INHALATION_SPRAY | Freq: Four times a day (QID) | RESPIRATORY_TRACT | 2 refills | Status: DC | PRN

## 2022-07-25 MED ORDER — METHYLPREDNISOLONE SODIUM SUCC 125 MG IJ SOLR
125.0000 mg | Freq: Once | INTRAMUSCULAR | Status: AC
  Administered 2022-07-25: 125 mg via INTRAVENOUS
  Filled 2022-07-25: qty 2

## 2022-07-25 MED ORDER — IPRATROPIUM-ALBUTEROL 0.5-2.5 (3) MG/3ML IN SOLN
3.0000 mL | Freq: Once | RESPIRATORY_TRACT | Status: AC
  Administered 2022-07-25: 3 mL via RESPIRATORY_TRACT
  Filled 2022-07-25: qty 3

## 2022-07-25 MED ORDER — IPRATROPIUM-ALBUTEROL 0.5-2.5 (3) MG/3ML IN SOLN
3.0000 mL | RESPIRATORY_TRACT | Status: AC
  Administered 2022-07-25 (×2): 3 mL via RESPIRATORY_TRACT
  Filled 2022-07-25 (×2): qty 3

## 2022-07-25 NOTE — ED Provider Notes (Signed)
[facility] Provider Note    Event Date/Time   First MD Initiated Contact with Patient 07/25/22 0255     (approximate)   History   Chest Pain   HPI  Blake [name] is a 67 y.o. male history of COPD, CHF, CAD who presents to the emergency department complaints of burning chest pain, shortness of breath and cough for the past 3 days.  No chest pain currently.  Does not wear oxygen chronically.  No lower extremity swelling or pain.  No fever.  History provided by patient.    Past Medical History:  Diagnosis Date   Acute headache    CHF (congestive heart failure) (HCC)    COPD (chronic obstructive pulmonary disease) ([place])    Coronary artery disease    a. cath 2009 w/ severe 3-vessel dz, rec bypass; b. MI 2012-->4-vessel cabg; c. cath 2016: occluded VG-OM and VG-RPDA, patent LIMA-LAD and patent VG-Diag, 95% stenosis of mid LCx s/p PCI/DES 0% residual stenosis   Depression    Hepatitis B    Hepatitis C    HFrEF (heart failure with reduced ejection fraction) ([place])    a. LV gram 2009 with EF 32% with anterolateral and apical HK   Hyperlipidemia    Hypertension    MI (myocardial infarction) ([place])    x 2   Tobacco abuse     Past Surgical History:  Procedure Laterality Date   CARDIAC CATHETERIZATION     CORONARY ANGIOPLASTY WITH STENT PLACEMENT  2009   CORONARY ARTERY BYPASS GRAFT  2012   [facility]   KNEE SURGERY     right knee    LEFT HEART CATH AND CORS/GRAFTS ANGIOGRAPHY N/A 06/09/2019   Procedure: LEFT HEART CATH AND CORS/GRAFTS ANGIOGRAPHY;  Surgeon: Wellington Hampshire, MD;  Location: [facility];  Service: Cardiovascular;  Laterality: N/A;    MEDICATIONS:  Prior to Admission medications   Medication Sig Start Date End Date Taking? Authorizing Provider  albuterol (VENTOLIN HFA) 108 (90 Base) MCG/ACT inhaler Inhale 2 puffs into the lungs every 6 (six) hours as needed for wheezing or shortness of breath. 08/15/21   Charlynne Cousins, MD   aspirin 81 MG chewable tablet Chew 1 tablet (81 mg total) by mouth daily. 09/04/18   Mikey College, NP  atorvastatin (LIPITOR) 40 MG tablet Take 1 tablet by mouth daily. 01/03/20   [provider]  furosemide (LASIX) 20 MG tablet Take 1 tablet (20 mg total) by mouth daily. 05/24/21 05/20/22  Alisa Graff, FNP  ibuprofen (ADVIL) 600 MG tablet Take 1 tablet (600 mg total) by mouth every 8 (eight) hours as needed. 09/05/21   Nance Pear, MD  lisinopril (ZESTRIL) 2.5 MG tablet Take 2.5 mg by mouth daily.    [provider]  loratadine (CLARITIN) 10 MG tablet Take 1 tablet (10 mg total) by mouth daily. 12/29/19   Malfi, Lupita Raider, FNP  metoprolol succinate (TOPROL-XL) 25 MG 24 hr tablet Take 1 tablet by mouth daily. 12/29/19   [provider]  nitroGLYCERIN (NITROSTAT) 0.4 MG SL tablet Place 1 tablet (0.4 mg total) under the tongue every 5 (five) minutes as needed for chest pain. Reported on 09/29/2015 12/29/19   Verl Bangs, FNP  omeprazole (PRILOSEC) 20 MG capsule Take 20 mg by mouth daily.    [provider]  potassium chloride SA (KLOR-CON) 20 MEQ tablet Take 1 tablet (20 mEq total) by mouth daily. 05/24/21   Alisa Graff, FNP  ranitidine (  ZANTAC) 150 MG tablet Take 1 tablet by mouth 2 (two) times daily. 09/27/10   [provider]  rivastigmine (EXELON) 3 MG capsule Take 3 mg by mouth 2 (two) times daily. 10/14/21 10/14/22  [provider]  umeclidinium-vilanterol (ANORO ELLIPTA) 62.5-25 MCG/ACT AEPB Inhale 1 puff into the lungs daily at 6 (six) AM. Patient not taking: Reported on 05/14/2022 08/15/21   Charlynne Cousins, MD    Physical Exam   Triage Vital Signs: ED Triage Vitals  Enc Vitals Group     BP 07/25/22 0203 (!) 161/84     Pulse Rate 07/25/22 0203 74     Resp 07/25/22 0203 20     Temp 07/25/22 0203 98 F (36.7 C)     Temp Source 07/25/22 0203 Oral     SpO2 07/25/22 0203 94 %     Weight 07/25/22 0201 150 lb (68 kg)      Height 07/25/22 0201 5\' 6"  (1.676 m)     Head Circumference --      Peak Flow --      Pain Score 07/25/22 0200 0     Pain Loc --      Pain Edu? --      Excl. in [place]? --     Most recent vital signs: Vitals:   07/25/22 0254 07/25/22 0300  BP: (!) 167/84 (!) 167/86  Pulse: 71 70  Resp: (!) 32 20  Temp:    SpO2: 95% 95%    CONSTITUTIONAL: Alert and oriented and responds appropriately to questions. Well-appearing; well-nourished HEAD: Normocephalic, atraumatic EYES: Conjunctivae clear, pupils appear equal, sclera nonicteric ENT: normal nose; moist mucous membranes NECK: Supple, normal ROM CARD: RRR; S1 and S2 appreciated; no murmurs, no clicks, no rubs, no gallops RESP: Diminished aeration at bases bilaterally.  Diffuse expiratory and inspiratory wheezing.  No rhonchi.  Mild basilar crackles.  No hypoxia at rest.  Slightly tachypneic. ABD/GI: Normal bowel sounds; non-distended; soft, non-tender, no rebound, no guarding, no peritoneal signs BACK: The back appears normal EXT: Normal ROM in all joints; no deformity noted, no edema; no cyanosis, no calf tenderness or calf swelling SKIN: Normal color for age and race; warm; no rash on exposed skin NEURO: Moves all extremities equally, normal speech PSYCH: The patient's mood and manner are appropriate.   ED Results / Procedures / Treatments   LABS: (all labs ordered are listed, but only abnormal results are displayed) Labs Reviewed  BRAIN NATRIURETIC PEPTIDE - Abnormal; Notable for the following components:      Result Value   B Natriuretic Peptide 319.5 (*)    All other components within normal limits  TROPONIN I (HIGH SENSITIVITY) - Abnormal; Notable for the following components:   Troponin I (High Sensitivity) 41 (*)    All other components within normal limits  TROPONIN I (HIGH SENSITIVITY) - Abnormal; Notable for the following components:   Troponin I (High Sensitivity) 40 (*)    All other components within normal limits   RESP PANEL BY RT-PCR (RSV, FLU A&B, COVID)  RVPGX2  CBC WITH DIFFERENTIAL/PLATELET  COMPREHENSIVE METABOLIC PANEL     EKG:  EKG Interpretation  Date/Time:  Tuesday July 25 2022 02:14:07 EST Ventricular Rate:  70 PR Interval:  290 QRS Duration: 94 QT Interval:  414 QTC Calculation: 447 R Axis:   91 Text Interpretation: Sinus rhythm with 1st degree A-V block Rightward axis Left ventricular hypertrophy with repolarization abnormality ( Sokolow-Lyon , Cornell product ) Cannot rule out Anteroseptal infarct (cited on  or before 25-Jul-2022) Abnormal ECG No significant change since last tracing Confirmed by Rochele Raring [phone]) on 07/25/2022 3:14:33 AM         RADIOLOGY: My personal review and interpretation of imaging: Chest x-ray shows pulmonary edema.  I have personally reviewed all radiology reports.   DG Chest Portable 1 View  Result Date: 07/25/2022 CLINICAL DATA:  Shortness of breath. EXAM: PORTABLE CHEST 1 VIEW COMPARISON:  Chest x-ray 05/14/2022, CT chest 01/29/2021 FINDINGS: The heart and mediastinal contours are unchanged. Surgical changes overlie the mediastinum. Aortic calcification. No focal consolidation. Increased interstitial markings. No pleural effusion. No pneumothorax. No acute osseous abnormality.  Sternotomy wires are intact. IMPRESSION: 1. Pulmonary edema. 2.  Aortic Atherosclerosis (ICD10-I70.0). Electronically Signed   By: Tish Frederickson M.D.   On: 07/25/2022 02:29     PROCEDURES:  Critical Care performed: No     .1-3 Lead EKG Interpretation  Performed by: Kryslyn Helbig, Layla Maw, DO Authorized by: Shawntina Diffee, Layla Maw, DO     Interpretation: normal     ECG rate:  71   ECG rate assessment: normal     Rhythm: sinus rhythm     Ectopy: none     Conduction: normal       IMPRESSION / MDM / ASSESSMENT AND PLAN / ED COURSE  I reviewed the triage vital signs and the nursing notes.    Patient here with complaints of cough, chest pain and shortness of  breath.  The patient is on the cardiac monitor to evaluate for evidence of arrhythmia and/or significant heart rate changes.   DIFFERENTIAL DIAGNOSIS (includes but not limited to):   COPD, CHF, pneumonia, viral URI, pneumothorax, ACS, PE   Patient's presentation is most consistent with acute presentation with potential threat to life or bodily function.   PLAN: Workup initiated from triage.  No leukocytosis.  Normal hemoglobin.  Normal electrolytes, renal function, LFTs.  Troponin minimally elevated at 41 but this appears to be his baseline.  Second pending.  COVID, flu and RSV negative.  BNP is 319.  Chest x-ray consistent with CHF exacerbation when reviewed and interpreted by myself and the radiologist.  Will give breathing treatments, Solu-Medrol, Lasix and reassess.  Have recommended admission but patient declined stating he would like to go home.  Will obtain ambulatory sat after medications given.   MEDICATIONS GIVEN IN ED: Medications  ipratropium-albuterol (DUONEB) 0.5-2.5 (3) MG/3ML nebulizer solution 3 mL (3 mLs Nebulization Given 07/25/22 0354)  methylPREDNISolone sodium succinate (SOLU-MEDROL) 125 mg/2 mL injection 125 mg (125 mg Intravenous Given 07/25/22 0313)  furosemide (LASIX) injection 40 mg (40 mg Intravenous Given 07/25/22 0312)  ipratropium-albuterol (DUONEB) 0.5-2.5 (3) MG/3ML nebulizer solution 3 mL (3 mLs Nebulization Given 07/25/22 0419)     ED COURSE: Patient's repeat troponin is flat.  He is feeling much better and respirations have improved and breath sounds sound more open and clear than before with scant wheezing.  Have again offered admission which she declines.  Will discharge home with with inhaler, steroids.  Recommended he increase his Lasix for the next several days.   At this time, I do not feel there is any life-threatening condition present. I reviewed all nursing notes, vitals, pertinent previous records.  All lab and urine results, EKGs, imaging ordered  have been independently reviewed and interpreted by myself.  I reviewed all available radiology reports from any imaging ordered this visit.  Based on my assessment, I feel the patient is safe to be discharged home without further emergent  workup and can continue workup as an outpatient as needed. Discussed all findings, treatment plan as well as usual and customary return precautions.  They verbalize understanding and are comfortable with this plan.  Outpatient follow-up has been provided as needed.  All questions have been answered.    CONSULTS: Offered admission which patient declines.   OUTSIDE RECORDS REVIEWED:  Reviewed patient's last cardiology note in May 2022.   Echo June 2022:  INTERPRETATION  MODERATE LV SYSTOLIC DYSFUNCTION (See above)  MODERATE RV SYSTOLIC DYSFUNCTION (See above)  SEVERE VALVULAR REGURGITATION (See above)  NO VALVULAR STENOSIS  ESTIMATED LVEF 30 - 35%  Tricuspid: SEVERE TRICUSPID REGURGITATION  Mitral: MODERATE TO SEVERE MITRAL REGURGITATION WITH MILD PHTN  Aortic: MILD TO MODERATE AORTIC REGURGITATION WITH SCLEROSIS BUT NO STENOSIS OF AOV  Pulmonic: MILD PULMONIC INSUFFICIENCY  BIATRIAL ENLARGEMENT: MODERATE  IRREGULAR HEART RHYTHM CAPTURED THROUGHOUT EXAM  RECOMMEND CONTRAST STUDY TO VISUALIZE APEX OF VENTRICLE   NM myocardial perfusion June 2022:   Abnormal myocardial perfusion scan large area of anterior  apical and inferior persistent defect suggestive of scar overall left  ventricular enlargement with severely depressed left ventricular function  between 25 and 30% there is no clear evidence of reversibility or  redistribution suggestive of ischemia distal appears to be fixed artifact.   Recommend medical therapy for the patient.  Ischemic cardiomyopathy   FINAL CLINICAL IMPRESSION(S) / ED DIAGNOSES   Final diagnoses:  Acute on chronic congestive heart failure, unspecified heart failure type (HCC)  COPD exacerbation (HCC)     Rx / DC  Orders   ED Discharge Orders          Ordered    albuterol (VENTOLIN HFA) 108 (90 Base) MCG/ACT inhaler  Every 6 hours PRN        07/25/22 0436    furosemide (LASIX) 20 MG tablet  Daily        07/25/22 0436    predniSONE (DELTASONE) 20 MG tablet  Daily        07/25/22 0436             Note:  This document was prepared using Dragon voice recognition software and may include unintentional dictation errors.   Shylo Dillenbeck, Layla Maw, DO 07/25/22 [phone]

## 2022-07-25 NOTE — ED Triage Notes (Signed)
To triage via [facility]/ Pt lives with friend currently/ Pt c/o burning chest pain that woke him up from sleeping tonight. Also endorses shortness of breath, cough and congestion. Denies fever.  Pt alert in triage, oriented to self but not time or place. Hx of dementia per chart

## 2022-07-25 NOTE — ED Triage Notes (Signed)
EMS brings pt in from home for recent cough & CP; hx CHF and COPD

## 2022-07-25 NOTE — Discharge Instructions (Addendum)
I recommend you increase your Lasix to 40 mg a day for the next 5 days.  I am giving him another prescription for this.  Please do not take your Lasix 20 mg daily while taking the 40 mg daily.

## 2022-11-01 ENCOUNTER — Emergency Department
Admission: EM | Admit: 2022-11-01 | Discharge: 2022-11-01 | Disposition: A | Discharge status: Home / Self Care | Payer: Medicare Other | Attending: Emergency Medicine | Admitting: Emergency Medicine

## 2022-11-01 ENCOUNTER — Other Ambulatory Visit: Payer: Self-pay

## 2022-11-01 DIAGNOSIS — Z951 Presence of aortocoronary bypass graft: Secondary | ICD-10-CM | POA: Diagnosis not present

## 2022-11-01 DIAGNOSIS — I509 Heart failure, unspecified: Secondary | ICD-10-CM | POA: Insufficient documentation

## 2022-11-01 DIAGNOSIS — K4090 Unilateral inguinal hernia, without obstruction or gangrene, not specified as recurrent: Secondary | ICD-10-CM | POA: Insufficient documentation

## 2022-11-01 LAB — CBC
HCT: 45.9 % (ref 39.0–52.0)
Hemoglobin: 14 g/dL (ref 13.0–17.0)
MCH: 29.6 pg (ref 26.0–34.0)
MCHC: 30.5 g/dL (ref 30.0–36.0)
MCV: 97 fL (ref 80.0–100.0)
Platelets: 218 10*3/uL (ref 150–400)
RBC: 4.73 MIL/uL (ref 4.22–5.81)
RDW: 12.7 % (ref 11.5–15.5)
WBC: 3.9 10*3/uL — ABNORMAL LOW (ref 4.0–10.5)
nRBC: 0 % (ref 0.0–0.2)

## 2022-11-01 LAB — BASIC METABOLIC PANEL
Anion gap: 5 (ref 5–15)
BUN: 14 mg/dL (ref 8–23)
CO2: 29 mmol/L (ref 22–32)
Calcium: 8.9 mg/dL (ref 8.9–10.3)
Chloride: 105 mmol/L (ref 98–111)
Creatinine, Ser: 0.95 mg/dL (ref 0.61–1.24)
GFR, Estimated: >60 mL/min (ref >60)
Glucose, Bld: 110 mg/dL — ABNORMAL HIGH (ref 70–99)
Potassium: 4.3 mmol/L (ref 3.5–5.1)
Sodium: 139 mmol/L (ref 135–145)

## 2022-11-01 NOTE — ED Provider Notes (Signed)
[facility] Provider Note   Event Date/Time   First MD Initiated Contact with Patient 11/01/22 1603     (approximate) History  Groin Pain  HPI Blake [name] is a 67 y.o. male with a stated past medical history of CABG, emphysema, heart failure, and hepatitis C who presents complaining of a mass to the right groin area that is been present for "a good time".  Patient denies ever having any known reducible mass in that area but states that it does occasionally hurt when he coughs or strains to go to the bathroom. ROS: Patient currently denies any vision changes, tinnitus, difficulty speaking, facial droop, sore throat, chest pain, shortness of breath, nausea/vomiting/diarrhea, dysuria, or weakness/numbness/paresthesias in any extremity   Physical Exam  Triage Vital Signs: ED Triage Vitals  Enc Vitals Group     BP 11/01/22 1504 (!) 170/75     Pulse Rate 11/01/22 1504 73     Resp 11/01/22 1504 16     Temp 11/01/22 1502 98 F (36.7 C)     Temp src --      SpO2 11/01/22 1504 96 %     Weight 11/01/22 1503 160 lb (72.6 kg)     Height 11/01/22 1503  (1.626 m)     Head Circumference --      Peak Flow --      Pain Score 11/01/22 1502 6     Pain Loc --      Pain Edu? --      Excl. in GC? --    Most recent vital signs: Vitals:   11/01/22 1502 11/01/22 1504  BP:  (!) 170/75  Pulse:  73  Resp:  16  Temp: 98 F (36.7 C)   SpO2:  96%   General: Awake, oriented x4. CV:  Good peripheral perfusion.  Resp:  Normal effort.  Abd:  No distention.  Muscular wall defect appreciated in the right inguinal region with contents palpated in the hernia sac and are easily reducible without pain Other:  Elderly well-developed African-American male laying on stretcher in no acute distress ED Results / Procedures / Treatments  Labs (all labs ordered are listed, but only abnormal results are displayed) Labs Reviewed  CBC - Abnormal; Notable for the following  components:      Result Value   WBC 3.9 (*)    All other components within normal limits  BASIC METABOLIC PANEL - Abnormal; Notable for the following components:   Glucose, Bld 110 (*)    All other components within normal limits  PROCEDURES: Critical Care performed: No .1-3 Lead EKG Interpretation  Performed by: Merwyn Katos, MD Authorized by: Merwyn Katos, MD     Interpretation: normal     ECG rate:  71   ECG rate assessment: normal     Rhythm: sinus rhythm     Ectopy: none     Conduction: normal    MEDICATIONS ORDERED IN ED: Medications - No data to display IMPRESSION / MDM / ASSESSMENT AND PLAN / ED COURSE  I reviewed the triage vital signs and the nursing notes.                             The patient is on the cardiac monitor to evaluate for evidence of arrhythmia and/or significant heart rate changes. Patient's presentation is most consistent with acute presentation with potential threat to life or bodily function. Hernia sac  soft. No signs of intestinal obstruction such as bilious vomiting. No changes to stooling pattern. No signs of skin changes overlying hernia sac  ED intervention: None necessary  Disposition: Advised follow up outpatient with primary care for symptom follow up within 24-48 hours and surgery clinic for future elective hernia repair. Return precautions discussed at bedside with appropriate understanding verbalized back.   FINAL CLINICAL IMPRESSION(S) / ED DIAGNOSES   Final diagnoses:  Right inguinal hernia   Rx / DC Orders   ED Discharge Orders     None      Note:  This document was prepared using Dragon voice recognition software and may include unintentional dictation errors.   Merwyn Katos, MD 11/01/22 [phone]

## 2022-11-01 NOTE — ED Triage Notes (Addendum)
Pt to ED from [facility] "possible hernia" to right groin area. Reports pain to right groin for 2 weeks. Denies n/v/d.  NAD noted

## 2022-11-03 ENCOUNTER — Telehealth: Payer: Self-pay

## 2022-11-03 NOTE — Transitions of Care (Post Inpatient/ED Visit) (Signed)
   11/03/2022  Name: Blake [name] MRN: [id number] DOB: 1956-02-20  Today's TOC FU Call Status: Today's TOC FU Call Status:: Unsuccessul Call (1st Attempt) Unsuccessful Call (1st Attempt) Date: 11/03/22  Attempted to reach the patient regarding the most recent Inpatient/ED visit.  Follow Up Plan: Additional outreach attempts will be made to reach the patient to complete the Transitions of Care (Post Inpatient/ED visit) call.   Signature  Leward Quan  [facility]

## 2022-11-07 ENCOUNTER — Ambulatory Visit (INDEPENDENT_AMBULATORY_CARE_PROVIDER_SITE_OTHER): Payer: Medicare Other | Admitting: Surgery

## 2022-11-07 ENCOUNTER — Encounter: Payer: Self-pay | Admitting: Surgery

## 2022-11-07 VITALS — BP 153/93 | HR 73 | Temp 98.0°F | Ht 67.0 in | Wt 158.0 lb

## 2022-11-07 DIAGNOSIS — K4090 Unilateral inguinal hernia, without obstruction or gangrene, not specified as recurrent: Secondary | ICD-10-CM

## 2022-11-07 NOTE — Progress Notes (Signed)
Patient ID: Blake [name], male   DOB: Feb 26, 1956, 67 y.o.   MRN: [id number]  Chief Complaint: Right inguinal hernia  History of Present Illness Blake [name] is a 67 y.o. male with right inguinal hernia, present for years, progressively more symptomatic.  Had severe pain, presented to the ED and referred here.    Past Medical History Past Medical History:  Diagnosis Date   Acute headache    CHF (congestive heart failure) (HCC)    COPD (chronic obstructive pulmonary disease) (HCC)    Coronary artery disease    a. cath 2009 w/ severe 3-vessel dz, rec bypass; b. MI 2012-->4-vessel cabg; c. cath 2016: occluded VG-OM and VG-RPDA, patent LIMA-LAD and patent VG-Diag, 95% stenosis of mid LCx s/p PCI/DES 0% residual stenosis   Depression    Hepatitis B    Hepatitis C    HFrEF (heart failure with reduced ejection fraction) (HCC)    a. LV gram 2009 with EF 32% with anterolateral and apical HK   Hyperlipidemia    Hypertension    MI (myocardial infarction) (HCC)    x 2   Tobacco abuse       Past Surgical History:  Procedure Laterality Date   CARDIAC CATHETERIZATION     CORONARY ANGIOPLASTY WITH STENT PLACEMENT  2009   CORONARY ARTERY BYPASS GRAFT  2012   [facility]   KNEE SURGERY     right knee    LEFT HEART CATH AND CORS/GRAFTS ANGIOGRAPHY N/A 06/09/2019   Procedure: LEFT HEART CATH AND CORS/GRAFTS ANGIOGRAPHY;  Surgeon: Iran Ouch, MD;  Location: [facility] INVASIVE CV [facility];  Service: Cardiovascular;  Laterality: N/A;    No Known Allergies  Current Outpatient Medications  Medication Sig Dispense Refill   albuterol (VENTOLIN HFA) 108 (90 Base) MCG/ACT inhaler Inhale 2 puffs into the lungs every 6 (six) hours as needed for wheezing or shortness of breath. 8 g 2   aspirin 81 MG chewable tablet Chew 1 tablet (81 mg total) by mouth daily. 30 tablet 11   atorvastatin (LIPITOR) 40 MG tablet Take 1 tablet by mouth daily.     ibuprofen (ADVIL) 600 MG tablet Take 1 tablet (600 mg total)  by mouth every 8 (eight) hours as needed. 20 tablet 0   lisinopril (ZESTRIL) 2.5 MG tablet Take 2.5 mg by mouth daily.     loratadine (CLARITIN) 10 MG tablet Take 1 tablet (10 mg total) by mouth daily. 30 tablet 0   metoprolol succinate (TOPROL-XL) 25 MG 24 hr tablet Take 1 tablet by mouth daily.     nitroGLYCERIN (NITROSTAT) 0.4 MG SL tablet Place 1 tablet (0.4 mg total) under the tongue every 5 (five) minutes as needed for chest pain. Reported on 09/29/2015 10 tablet 0   omeprazole (PRILOSEC) 20 MG capsule Take 20 mg by mouth daily.     potassium chloride SA (KLOR-CON) 20 MEQ tablet Take 1 tablet (20 mEq total) by mouth daily. 30 tablet 5   predniSONE (DELTASONE) 20 MG tablet Take 3 tablets (60 mg total) by mouth daily. 15 tablet 0   ranitidine (ZANTAC) 150 MG tablet Take 1 tablet by mouth 2 (two) times daily.     umeclidinium-vilanterol (ANORO ELLIPTA) 62.5-25 MCG/ACT AEPB Inhale 1 puff into the lungs daily at 6 (six) AM. 1 each 2   furosemide (LASIX) 20 MG tablet Take 1 tablet (20 mg total) by mouth daily. 30 tablet 5   rivastigmine (EXELON) 3 MG capsule Take 3 mg by mouth 2 (two) times daily.  No current facility-administered medications for this visit.    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 Last Weight  Most recent update: 11/07/2022  2:38 PM    Weight  71.7 kg (158 lb)             CONSTITUTIONAL: Well developed, and nourished, appropriately responsive and aware without distress.  Appears somewhat sickly looking, having some upper airway congestion/cough.  Obvious smoker/tobacco scent. EYES: Sclera non-icteric.   EARS, NOSE, MOUTH AND THROAT: Oral mucosa is pink and moist.    Hearing is intact to voice.  NECK: Trachea is midline, and there is no jugular venous distension.  LYMPH NODES:  Lymph nodes in the neck are not appreciated. RESPIRATORY:  Lungs are notable for scattered dry rhonchi, and breath sounds are equal bilaterally.  Normal respiratory effort without pathologic use of accessory muscles. CARDIOVASCULAR: Heart is regular in rate and rhythm.   Well perfused.  GI: The abdomen is soft, nontender, and nondistended. There were no palpable masses.  GU: Readily appreciated and reducible and obvious right groin hernia, consistent with direct.  No appreciable left inguinal hernia.  No scrotal extension of hernia.  Testes are descended bilaterally with uncircumcised male phallus. MUSCULOSKELETAL:  Symmetrical muscle tone appreciated in all four extremities.    SKIN: Skin turgor is normal. No pathologic skin lesions appreciated.  NEUROLOGIC:  Motor and sensation appear grossly normal.  Cranial nerves are grossly without defect. PSYCH:  Alert and oriented to person, place and time. Affect is appropriate for situation.  Data Reviewed I have personally reviewed what is currently available of the patient's imaging, recent labs and medical records.   Labs:     Latest Ref Rng & Units 11/01/2022    3:05 PM 07/25/2022    2:04 AM 05/14/2022    6:27 PM  CBC  WBC 4.0 - 10.5 K/uL 3.9  5.4  4.7   Hemoglobin 13.0 - 17.0 g/dL 40.9  81.1  91.4   Hematocrit 39.0 - 52.0 % 45.9  43.8  43.8   Platelets 150 - 400 K/uL 218  231  218       Latest Ref Rng & Units 11/01/2022     3:05 PM 07/25/2022    2:04 AM 05/14/2022    6:27 PM  CMP  Glucose 70 - 99 mg/dL 782  94  92   BUN 8 - 23 mg/dL 14  21  14    Creatinine 0.61 - 1.24 mg/dL 9.56  2.13  0.86   Sodium 135 - 145 mmol/L 139  138  141  Potassium 3.5 - 5.1 mmol/L 4.3  3.9  3.6   Chloride 98 - 111 mmol/L 105  105  107   CO2 22 - 32 mmol/L 29  26  29    Calcium 8.9 - 10.3 mg/dL 8.9  8.9  8.9   Total Protein 6.5 - 8.1 g/dL  8.0    Total Bilirubin 0.3 - 1.2 mg/dL  0.9    Alkaline Phos 38 - 126 U/L  86    AST 15 - 41 U/L  28    ALT 0 - 44 U/L  16        Imaging: Radiological images reviewed:   Within last 24 hrs: No results found.  Assessment    Right inguinal hernia Patient Active Problem List   Diagnosis Date Noted   Right inguinal hernia 11/07/2022   Dementia without behavioral disturbance (HCC) 05/14/2022   Chronic pain of right knee 08/15/2021   Hyperglycemia 08/15/2021   Intellectual disability 04/15/2021   Benign prostatic hyperplasia with urinary obstruction 09/29/2015   Nicotine dependence, chewing tobacco, uncomplicated 09/29/2015   Clinical depression 09/29/2015   Essential (primary) hypertension 09/29/2015   Hypercholesteremia 09/29/2015   H/O acute myocardial infarction 09/29/2015   S/P CABG (coronary artery bypass graft) 09/16/2014   Coronary artery disease involving native coronary artery of native heart with other form of angina pectoris (HCC) 09/16/2014   Centrilobular emphysema (HCC) 09/16/2014   Chronic systolic CHF (congestive heart failure) (HCC) 09/16/2014   Hepatitis C virus infection 01/26/2011    Plan    I will need cardiac clearance prior to proceeding with general anesthetic for elective right inguinal hernia repair.  Referral made.  Face-to-face time spent with the patient and accompanying care providers(if present) was 30 minutes, with more than 50% of the time spent counseling, educating, and coordinating care of the patient.    These notes generated with voice  recognition software. I apologize for typographical errors.  Campbell Lerner M.D., FACS 11/07/2022, 2:59 PM

## 2022-11-07 NOTE — Patient Instructions (Addendum)
We will send a message to [facility] for you to be seen by them. They will call you to schedule this appointment. It is important that you see them in order for you to have surgery.   You are scheduled to see Dr Juliann Pares at [facility] at [facility]([place] [facility]) on May 20th at 10:45 am.   We will have you follow up here to discuss surgery after you see him.

## 2022-11-08 ENCOUNTER — Telehealth: Payer: Self-pay

## 2022-11-08 NOTE — Transitions of Care (Post Inpatient/ED Visit) (Unsigned)
   11/08/2022  Name: Blake [name] MRN: [id number] DOB: 01-11-1956  Today's TOC FU Call Status: Today's TOC FU Call Status:: Unsuccessful Call (2nd Attempt)  Attempted to reach the patient regarding the most recent Inpatient/ED visit.  Follow Up Plan: Additional outreach attempts will be made to reach the patient to complete the Transitions of Care (Post Inpatient/ED visit) call.   [facility] [place], [facility]

## 2022-11-08 NOTE — Telephone Encounter (Signed)
-----   Message from Pablo Ledger, [place] sent at 11/03/2022 10:43 AM EDT ----- Patient needs TOC call completed please.

## 2022-11-09 ENCOUNTER — Telehealth: Payer: Self-pay

## 2022-11-09 NOTE — Transitions of Care (Post Inpatient/ED Visit) (Signed)
   11/09/2022  Name: Blake [name] MRN: [id number] DOB: [birth year]  Today's TOC FU Call Status: Today's TOC FU Call Status:: Unsuccessful Call (3rd Attempt)  Attempted to reach the patient regarding the most recent Inpatient/ED visit.  Follow Up Plan: No further outreach attempts will be made at this time. We have been unable to contact the patient.  [facility] [place], [facility]

## 2022-11-15 ENCOUNTER — Ambulatory Visit
Admission: RE | Admit: 2022-11-15 | Discharge: 2022-11-15 | Disposition: A | Discharge status: Home / Self Care | Payer: Medicare Other | Source: Ambulatory Visit | Attending: Nurse Practitioner | Admitting: Nurse Practitioner

## 2022-11-15 ENCOUNTER — Ambulatory Visit (INDEPENDENT_AMBULATORY_CARE_PROVIDER_SITE_OTHER): Payer: Medicare Other | Admitting: Nurse Practitioner

## 2022-11-15 ENCOUNTER — Encounter: Payer: Self-pay | Admitting: Nurse Practitioner

## 2022-11-15 ENCOUNTER — Ambulatory Visit
Admission: RE | Admit: 2022-11-15 | Discharge: 2022-11-15 | Disposition: A | Discharge status: Home / Self Care | Payer: Medicare Other | Attending: Nurse Practitioner | Admitting: Nurse Practitioner

## 2022-11-15 VITALS — BP 138/74 | HR 72 | Temp 98.2°F | Ht 64.02 in | Wt 159.8 lb

## 2022-11-15 DIAGNOSIS — N138 Other obstructive and reflux uropathy: Secondary | ICD-10-CM

## 2022-11-15 DIAGNOSIS — E7800 Pure hypercholesterolemia, unspecified: Secondary | ICD-10-CM

## 2022-11-15 DIAGNOSIS — J432 Centrilobular emphysema: Secondary | ICD-10-CM | POA: Diagnosis not present

## 2022-11-15 DIAGNOSIS — N401 Enlarged prostate with lower urinary tract symptoms: Secondary | ICD-10-CM

## 2022-11-15 DIAGNOSIS — F0390 Unspecified dementia without behavioral disturbance: Secondary | ICD-10-CM

## 2022-11-15 DIAGNOSIS — E559 Vitamin D deficiency, unspecified: Secondary | ICD-10-CM

## 2022-11-15 DIAGNOSIS — F17220 Nicotine dependence, chewing tobacco, uncomplicated: Secondary | ICD-10-CM

## 2022-11-15 DIAGNOSIS — B182 Chronic viral hepatitis C: Secondary | ICD-10-CM | POA: Diagnosis not present

## 2022-11-15 DIAGNOSIS — I5022 Chronic systolic (congestive) heart failure: Secondary | ICD-10-CM | POA: Insufficient documentation

## 2022-11-15 DIAGNOSIS — R7301 Impaired fasting glucose: Secondary | ICD-10-CM

## 2022-11-15 DIAGNOSIS — I10 Essential (primary) hypertension: Secondary | ICD-10-CM

## 2022-11-15 DIAGNOSIS — I25118 Atherosclerotic heart disease of native coronary artery with other forms of angina pectoris: Secondary | ICD-10-CM

## 2022-11-15 DIAGNOSIS — F324 Major depressive disorder, single episode, in partial remission: Secondary | ICD-10-CM

## 2022-11-15 MED ORDER — RIVASTIGMINE TARTRATE 3 MG PO CAPS: 3.0000 mg | ORAL_CAPSULE | Freq: Two times a day (BID) | ORAL | 0 refills | Status: DC

## 2022-11-15 MED ORDER — METOPROLOL SUCCINATE ER 25 MG PO TB24: 25.0000 mg | ORAL_TABLET | Freq: Every day | ORAL | 0 refills | Status: DC

## 2022-11-15 MED ORDER — ATORVASTATIN CALCIUM 40 MG PO TABS: 40.0000 mg | ORAL_TABLET | Freq: Every day | ORAL | 0 refills | Status: DC

## 2022-11-15 MED ORDER — FUROSEMIDE 20 MG PO TABS: 40.0000 mg | ORAL_TABLET | Freq: Every day | ORAL | 0 refills | Status: DC

## 2022-11-15 MED ORDER — UMECLIDINIUM-VILANTEROL 62.5-25 MCG/ACT IN AEPB: 1.0000 | INHALATION_SPRAY | Freq: Every day | RESPIRATORY_TRACT | 4 refills | Status: DC

## 2022-11-15 NOTE — Patient Instructions (Signed)
Heart Failure Eating Plan Heart failure, also called congestive heart failure, occurs when your heart does not pump blood well enough to meet your body's needs for oxygen-rich blood. Heart failure is a long-term (chronic) condition. Living with heart failure can be challenging. Following your health care provider's instructions about a healthy lifestyle and working with a dietitian to choose the right foods may help to improve your symptoms. An eating plan for someone with heart failure will include changes that limit the intake of salt (sodium) and unhealthy fat. What are tips for following this plan? Reading food labels Check food labels for the amount of sodium per serving. Choose foods that have less than 140 mg (milligrams) of sodium in each serving. Check food labels for the number of calories per serving. This is important if you need to limit your daily calorie intake to lose weight. Check food labels for the serving size. If you eat more than one serving, you will be eating more sodium and calories than what is listed on the label. Look for foods that are labeled as "sodium-free," "very low sodium," or "low sodium." Foods labeled as "reduced sodium" or "lightly salted" may still have more sodium than what is recommended for you. Cooking Avoid adding salt when cooking. Ask your health care provider or dietitian before using salt substitutes. Season food with salt-free seasonings, spices, or herbs. Check the label of seasoning mixes to make sure they do not contain salt. Cook with heart-healthy oils, such as olive, canola, soybean, or sunflower oil. Do not fry foods. Cook foods using low-fat methods, such as baking, boiling, grilling, and broiling. Limit unhealthy fats when cooking by: Removing the skin from poultry, such as chicken. Removing all visible fats from meats. Skimming the fat off from stews, soups, and gravies before serving them. Meal planning  Limit your intake  of: Processed, canned, or prepackaged foods. Foods that are high in trans fat, such as fried foods. Sweets, desserts, sugary drinks, and other foods with added sugar. Full-fat dairy products, such as whole milk. Eat a balanced diet. This may include: 4-5 servings of fruit each day and 4-5 servings of vegetables each day. At each meal, try to fill one-half of your plate with fruits and vegetables. Up to 6-8 servings of whole grains each day. Up to 2 servings of lean meat, poultry, or fish each day. One serving of meat is equal to 3 oz (85 g). This is about the same size as a deck of cards. 2 servings of low-fat dairy each day. Heart-healthy fats. Healthy fats called omega-3 fatty acids are found in foods such as flaxseed and cold-water fish like sardines, salmon, and mackerel. Aim to eat 25-35 g (grams) of fiber a day. Foods that are high in fiber include apples, broccoli, carrots, beans, peas, and whole grains. Do not add salt or condiments that contain salt (such as soy sauce) to foods before eating. When eating at a restaurant, ask that your food be prepared with less salt or no salt, if possible. Try to eat 2 or more vegetarian meals each week. Eat more home-cooked food and eat less restaurant, buffet, and fast food. General information Do not eat more than 2,300 mg of sodium a day. The amount of sodium that is recommended for you may be lower, depending on your condition. Maintain a healthy body weight as directed. Ask your health care provider what a healthy weight is for you. Check your weight every day. Work with your health care provider   and dietitian to make a plan that is right for you to lose weight or maintain your current weight. Limit how much fluid you drink. Ask your health care provider or dietitian how much fluid you can have each day. Limit or avoid alcohol as told by your health care provider or dietitian. Recommended foods Fruits All fresh, frozen, and canned fruits.  Dried fruits, such as raisins, prunes, and cranberries. Vegetables All fresh vegetables. Vegetables that are frozen without sauce or added salt. Low-sodium or sodium-free canned vegetables. Grains Bread with less than 80 mg of sodium per slice. Whole-wheat pasta, quinoa, and brown rice. Oats and oatmeal. Barley. Millet. Grits and cream of wheat. Whole-grain and whole-wheat cold cereal. Meats and other protein foods Lean cuts of meat. Skinless chicken and turkey. Fish with high omega-3 fatty acids, such as salmon, sardines, and other cold-water fishes. Eggs. Dried beans, peas, and edamame. Unsalted nuts and nut butters. Dairy Low-fat or nonfat (skim) milk and dried milk. Rice milk, soy milk, and almond milk. Low-fat or nonfat yogurt. Small amounts of reduced-sodium block cheese. Low-sodium cottage cheese. Fats and oils Olive, canola, soybean, flaxseed, avocado, or sunflower oil. Sweets and desserts Applesauce. Granola bars. Sugar-free pudding and gelatin. Frozen fruit bars. Seasoning and other foods Fresh and dried herbs. Lemon or lime juice. Vinegar. Low-sodium ketchup. Salt-free marinades, salad dressings, sauces, and seasonings. The items listed above may not be a complete list of foods and beverages you can eat. Contact a dietitian for more information. Foods to avoid Fruits Fruits that are dried with sodium-containing preservatives. Vegetables Canned vegetables. Frozen vegetables with sauce or seasonings. Creamed vegetables. French fries. Onion rings. Pickled vegetables and sauerkraut. Grains Bread with more than 80 mg of sodium per slice. Hot or cold cereal with more than 140 mg sodium per serving. Salted pretzels and crackers. Prepackaged breadcrumbs. Bagels, croissants, and biscuits. Meats and other protein foods Ribs and chicken wings. Bacon, ham, pepperoni, bologna, salami, and packaged luncheon meats. Hot dogs, bratwurst, and sausage. Canned meat. Smoked meat and fish. Salted nuts  and seeds. Dairy Whole milk, half-and-half, and cream. Buttermilk. Processed cheese, cheese spreads, and cheese curds. Regular cottage cheese. Feta cheese. Shredded cheese. String cheese. Fats and oils Butter, lard, shortening, ghee, and bacon fat. Canned and packaged gravies. Seasoning and other foods Onion salt, garlic salt, table salt, and sea salt. Marinades. Regular salad dressings. Relishes, pickles, and olives. Meat flavorings and tenderizers, and bouillon cubes. Horseradish, ketchup, and mustard. Worcestershire sauce. Teriyaki sauce, soy sauce (including reduced sodium). Hot sauce and Tabasco sauce. Steak sauce, fish sauce, oyster sauce, and cocktail sauce. Taco seasonings. Barbecue sauce. Tartar sauce. The items listed above may not be a complete list of foods and beverages you should avoid. Contact a dietitian for more information. Summary A heart failure eating plan includes changes that limit your intake of sodium and unhealthy fat, and it may help you lose weight or maintain a healthy weight. Your health care provider may also recommend limiting how much fluid you drink. Most people with heart failure should eat no more than 2,300 mg of salt (sodium) a day. The amount of sodium that is recommended for you may be lower, depending on your condition. Contact your health care provider or dietitian before making any major changes to your diet. This information is not intended to replace advice given to you by your health care provider. Make sure you discuss any questions you have with your health care provider. Document Revised: 10/04/2021 Document Reviewed: 02/09/2020   Elsevier Patient Education  2023 [employer]  

## 2022-11-15 NOTE — Assessment & Plan Note (Signed)
Chronic, ongoing in daily smoker.  Recommend complete cessation of smoking.  Poor adherence to medication regimen, maybe taking preventative inhaler 2 times a week.  Sister is main caregiver and reports he will not take when provided.  ?need for pill packs.  Will send [facility] referral for assist.  Discussed with him importance of using Anoro daily as is preventative to help with symptoms and to use Albuterol only as needed, educated sister and him on this.  Would benefit lung CT cancer screening, but refuses at this time.  Obtain CXR today as concern for exacerbation vs fluid overload -- currently am increasing Lasix, but will add on abx if imaging notes infection.

## 2022-11-15 NOTE — Assessment & Plan Note (Signed)
A1c last checked February 2023 at 6%, recheck today and initiate medication as needed.

## 2022-11-15 NOTE — Assessment & Plan Note (Signed)
Chronic, ongoing.  Continue current medication regimen and adjust as needed. Lipid panel today. 

## 2022-11-15 NOTE — Progress Notes (Signed)
BP 138/74 (BP Location: Left Arm, Patient Position: Sitting, Cuff Size: Normal)   Pulse 72   Temp 98.2 F (36.8 C) (Oral)   Ht 5' 4.02" (1.626 m)   Wt 159 lb 12.8 oz (72.5 kg)   SpO2 94%   BMI 27.42 kg/m    Subjective:    Patient ID: Blake [name], male    DOB: 1955/12/10, 67 y.o.   MRN: [id number]  HPI: Blake [name] is a 67 y.o. male  Chief Complaint  Patient presents with   Shortness of Breath   Hernia    Has 2 hernia's, need repair, will not do surgery until he see pcp to give clearance , also has has appt on 5/20 with cardio,   Sister at bedside to assist with HPI, he lives with her.  HYPERTENSION / HYPERLIPIDEMIA/HF To be taking Metoprolol, Lisinopril, Lasix -- does not take medication every day -- takes 2 days a week.  Sees cardiology and HF clinic with last visits 11/22/2020 and HF clinic 05/23/21.  On the 20th of May returns to cardiology as needs to be cleared for surgery.  Has history of MI x 2 and CABG -- 9 to 10 years ago per report.  Follows with neurology, last visit 10/14/2021 and was started on Exelon for dementia.  They do have family history of uncle with dementia. Satisfied with current treatment? yes Duration of hypertension: chronic BP monitoring frequency: not checking BP range:  BP medication side effects: no Duration of hyperlipidemia: chronic Cholesterol medication side effects: no Cholesterol supplements: none Medication compliance: poor compliance Aspirin: yes Recent stressors: no Recurrent headaches: no Visual changes: no Palpitations:  occasionally  -- for weeks, not taking Lasix as ordered, only two days a week Dyspnea: yes Chest pain: no Lower extremity edema: yes -- for weeks, not taking Lasix as ordered, only two days a week Dizzy/lightheaded: no   COPD Is a current smoker -- 1/2 PPD -- has smoked since he was 36 to 67 years old.  Has not be using Anoro consistently, wife reports he does run to Albuterol whenever SOB present.    COPD status: uncontrolled Satisfied with current treatment?: yes Oxygen use: no Dyspnea frequency: yes -- not using inhalers as ordered Cough frequency: yes Rescue inhaler frequency:  2 times a day Limitation of activity: no Productive cough: no Last Spirometry: unknown Pneumovax:  provided today Influenza:  refused  PREDIABETES Elevated glucose on past labs.  February 2024 A1c 6%. Polydipsia/polyuria: no Visual disturbance: no Chest pain: no Paresthesias: no  DEPRESSION No current medications.   Mood status: stable Depressed mood: no Anxious mood: no Anhedonia: no Significant weight loss or gain: no Insomnia: yes hard to fall asleep Fatigue: yes Feelings of worthlessness or guilt: no Impaired concentration/indecisiveness: no Suicidal ideations: no Hopelessness: no Crying spells: no    11/15/2022   11:33 AM 02/16/2022    9:12 AM 09/04/2018    1:55 PM  Depression screen PHQ 2/9  Decreased Interest 0 0 0  Down, Depressed, Hopeless 0 0 0  PHQ - 2 Score 0 0 0  Altered sleeping 0    Tired, decreased energy 0    Change in appetite 0    Feeling bad or failure about yourself  0    Trouble concentrating 0    Moving slowly or fidgety/restless 0    Suicidal thoughts 0    PHQ-9 Score 0    Difficult doing work/chores Not difficult at all      Relevant past  medical, surgical, family and social history reviewed and updated as indicated. Interim medical history since our last visit reviewed. Allergies and medications reviewed and updated.  Review of Systems  Constitutional:  Positive for fatigue. Negative for activity change, appetite change, diaphoresis and fever.  Respiratory:  Positive for cough and shortness of breath. Negative for chest tightness and wheezing.   Cardiovascular:  Positive for leg swelling. Negative for chest pain and palpitations.  Gastrointestinal: Negative.   Endocrine: Negative for cold intolerance, heat intolerance, polydipsia, polyphagia and  polyuria.  Musculoskeletal: Negative.   Skin: Negative.   Neurological:  Negative for dizziness, syncope, weakness, light-headedness, numbness and headaches.  Psychiatric/Behavioral: Negative.      Per HPI unless specifically indicated above     Objective:    BP 138/74 (BP Location: Left Arm, Patient Position: Sitting, Cuff Size: Normal)   Pulse 72   Temp 98.2 F (36.8 C) (Oral)   Ht 5' 4.02" (1.626 m)   Wt 159 lb 12.8 oz (72.5 kg)   SpO2 94%   BMI 27.42 kg/m   Wt Readings from Last 3 Encounters:  11/15/22 159 lb 12.8 oz (72.5 kg)  11/07/22 158 lb (71.7 kg)  11/01/22 160 lb (72.6 kg)    Physical Exam Vitals and nursing note reviewed. Exam conducted with a chaperone present.  Constitutional:      General: He is awake. He is not in acute distress.    Appearance: He is well-developed and well-groomed. He is not ill-appearing or toxic-appearing.  HENT:     Head: Normocephalic.     Right Ear: Hearing and external ear normal.     Left Ear: Hearing and external ear normal.  Eyes:     General: Lids are normal.     Extraocular Movements: Extraocular movements intact.     Conjunctiva/sclera: Conjunctivae normal.  Neck:     Thyroid: No thyromegaly.     Vascular: No carotid bruit.  Cardiovascular:     Rate and Rhythm: Normal rate and regular rhythm.     Heart sounds: Normal heart sounds. No murmur heard.    No gallop.  Pulmonary:     Effort: No accessory muscle usage or respiratory distress.     Breath sounds: Wheezing and rhonchi present. No decreased breath sounds or rales.     Comments: Expiratory wheezes throughout noted and rhonchi to lower lobes.  Intermittent cough present, no production. Abdominal:     General: Bowel sounds are normal. There is no distension.     Palpations: Abdomen is soft. There is hepatomegaly. There is no splenomegaly.     Tenderness: There is no abdominal tenderness.  Musculoskeletal:     Cervical back: Full passive range of motion without  pain.     Right lower leg: 2+ Pitting Edema present.     Left lower leg: 2+ Pitting Edema present.  Lymphadenopathy:     Cervical: No cervical adenopathy.  Skin:    General: Skin is warm.     Capillary Refill: Capillary refill takes less than 2 seconds.  Neurological:     Mental Status: He is alert.     Cranial Nerves: Cranial nerves 2-12 are intact.     Deep Tendon Reflexes: Reflexes are normal and symmetric.     Reflex Scores:      Brachioradialis reflexes are 2+ on the right side and 2+ on the left side.      Patellar reflexes are 2+ on the right side and 2+ on the left side.  Comments: Reports it is 2003, February, and Monday.  Able to state city and state.  Not able to report President.  Pleasantly confused.  Psychiatric:        Attention and Perception: Attention normal.        Mood and Affect: Mood normal.        Speech: Speech normal.        Behavior: Behavior normal. Behavior is cooperative.        Cognition and Memory: Memory is impaired.     Results for orders placed or performed during the hospital encounter of 11/01/22  CBC  Result Value Ref Range   WBC 3.9 (L) 4.0 - 10.5 K/uL   RBC 4.73 4.22 - 5.81 MIL/uL   Hemoglobin 14.0 13.0 - 17.0 g/dL   HCT 42.7 06.2 - 37.6 %   MCV 97.0 80.0 - 100.0 fL   MCH 29.6 26.0 - 34.0 pg   MCHC 30.5 30.0 - 36.0 g/dL   RDW 28.3 15.1 - 76.1 %   Platelets 218 150 - 400 K/uL   nRBC 0.0 0.0 - 0.2 %  Basic metabolic panel  Result Value Ref Range   Sodium 139 135 - 145 mmol/L   Potassium 4.3 3.5 - 5.1 mmol/L   Chloride 105 98 - 111 mmol/L   CO2 29 22 - 32 mmol/L   Glucose, Bld 110 (H) 70 - 99 mg/dL   BUN 14 8 - 23 mg/dL   Creatinine, Ser 6.07 0.61 - 1.24 mg/dL   Calcium 8.9 8.9 - 37.1 mg/dL   GFR, Estimated >06 >26 mL/min   Anion gap 5 5 - 15      Assessment & Plan:   Problem List Items Addressed This Visit       Cardiovascular and Mediastinum   Chronic systolic CHF (congestive heart failure) (HCC) - Primary    Chronic,  ongoing.  Poor adherence to medication regimen, maybe taking medications 2 times a week.  Sister is main caregiver and reports he will not take when provided.  ?need for pill packs.  Will send CCM referral for assist.  At this time he appears fluid overloaded.  Will obtain imaging chest and increase Lasix to 40 MG daily for one week.  Referral to get back into HF clinic.  Sees cardiology upcoming, although suspect poor candidate for surgery at this time.  Obtain CXR today as concern for exacerbation vs fluid overload -- currently am increasing Lasix, but will add on abx if imaging notes infection.  Recommend: - Reminded to call for an overnight weight gain of >2 pounds or a weekly weight gain of >5 pounds - not adding salt to food and read food labels. Reviewed the importance of keeping daily sodium intake to 2000mg  daily.  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  Has upcoming visit with cardiology, highly recommend they attend as needs clearance for surgery, but concern due to poor adherence and health would be  poor candidate for this at this time.  Continue to collaborate with cardiology and discussed with his sister and him importance of adherence to regimen.  CCM referral placed.      Relevant Medications   furosemide (LASIX) 20 MG tablet   metoprolol succinate (TOPROL-XL) 25 MG 24 hr tablet   atorvastatin (LIPITOR) 40 MG tablet   Other Relevant Orders   Comprehensive metabolic panel   Lipid Panel w/o Chol/HDL  Ratio   AMB Referral to Chronic Care Management Services   Essential (primary) hypertension    Chronic, ongoing.  Initial BP elevated, but repeat improved.  Poor adherence to medication regimen, maybe taking medications 2 times a week.  Sister is main caregiver and reports he will not take when provided.  ?need for pill packs.  Will send CCM referral for assist.  Recommend he monitor BP at least a few mornings a week at home and document.  DASH diet at home.  Continue current medication regimen and adjust as needed.  Labs today: CBC, CMP, TSH, BNP.  Return in 6 months.       Relevant Medications   furosemide (LASIX) 20 MG tablet   metoprolol succinate (TOPROL-XL) 25 MG 24 hr tablet   atorvastatin (LIPITOR) 40 MG tablet   Other Relevant Orders   CBC with Differential/Platelet   AMB Referral to Chronic Care Management Services     Respiratory   Centrilobular emphysema (HCC)    Chronic, ongoing in daily smoker.  Recommend complete cessation of smoking.  Poor adherence to medication regimen, maybe taking preventative inhaler 2 times a week.  Sister is main caregiver and reports he will not take when provided.  ?need for pill packs.  Will send CCM referral for assist.  Discussed with him importance of using Anoro daily as is preventative to help with symptoms and to use Albuterol only as needed, educated sister and him on this.  Would benefit lung CT cancer screening, but refuses at this time.  Obtain CXR today as concern for exacerbation vs fluid overload -- currently am increasing Lasix, but will add on abx if imaging notes infection.      Relevant Medications   umeclidinium-vilanterol (ANORO ELLIPTA) 62.5-25 MCG/ACT AEPB   Other Relevant Orders   CBC with Differential/Platelet   Comprehensive metabolic panel   DG Chest 2 View   AMB Referral to Chronic Care Management Services     Digestive   Chronic hepatitis C (HCC)    Chronic, ongoing.  They report he did not complete full treatment.   Referral to GI placed.      Relevant Orders   Comprehensive metabolic panel   Ambulatory referral to Gastroenterology   AMB Referral to Chronic Care Management Services     Endocrine   IFG (impaired fasting glucose)    A1c last checked February 2023 at 6%, recheck today and initiate medication as needed.      Relevant Orders   HgB A1c     Nervous and Auditory   Dementia without behavioral disturbance (HCC)    Chronic, progressive.  Continue Exelon per neurology and discussed with his sister need to schedule follow-up with them to ensure ongoing continuity of care.  CCM referral placed.        Relevant Medications   rivastigmine (EXELON) 3 MG capsule   Other Relevant Orders   CBC with Differential/Platelet   TSH   Vitamin B12   AMB Referral to Chronic Care Management Services     Genitourinary  Benign prostatic hyperplasia with urinary obstruction    Will check PSA today.      Relevant Orders   PSA     Other   Clinical depression    Chronic, ongoing.  Denies SI/HI.  No current medications and scoring is stable.  Initiate medication as needed in future.  Could consider Zoloft.      Hypercholesteremia    Chronic, ongoing.  Continue current medication regimen and adjust as needed.  Lipid panel today.         Relevant Medications   furosemide (LASIX) 20 MG tablet   metoprolol succinate (TOPROL-XL) 25 MG 24 hr tablet   atorvastatin (LIPITOR) 40 MG tablet   Other Relevant Orders   Comprehensive metabolic panel   Lipid Panel w/o Chol/HDL Ratio   AMB Referral to Chronic Care Management Services   Nicotine dependence, chewing tobacco, uncomplicated    I have recommended complete cessation of tobacco use. I have discussed various options available for assistance with tobacco cessation including over the counter methods (Nicotine gum, patch and lozenges). We also discussed prescription options (Chantix, Nicotine Inhaler / Nasal Spray). The patient is not interested in  pursuing any prescription tobacco cessation options at this time.       Other Visit Diagnoses     Vitamin D deficiency       History of low levels reported -- recheck today and start supplement as needed.   Relevant Orders   VITAMIN D 25 Hydroxy (Vit-D Deficiency, Fractures)        Follow up plan: Return in about 1 week (around 11/22/2022) for LUNG CHECK -- HF and COPD.

## 2022-11-15 NOTE — Assessment & Plan Note (Addendum)
Chronic, ongoing.  Poor adherence to medication regimen, maybe taking medications 2 times a week.  Sister is main caregiver and reports he will not take when provided.  ?need for pill packs.  Will send CCM referral for assist.  At this time he appears fluid overloaded.  Will obtain imaging chest and increase Lasix to 40 MG daily for one week.  Referral to get back into HF clinic.  Sees cardiology upcoming, although suspect poor candidate for surgery at this time.  Obtain CXR today as concern for exacerbation vs fluid overload -- currently am increasing Lasix, but will add on abx if imaging notes infection.  Recommend: - Reminded to call for an overnight weight gain of >2 pounds or a weekly weight gain of >5 pounds - not adding salt to food and read food labels. Reviewed the importance of keeping daily sodium intake to 2000mg  daily.  - Avoid Ibuprofen products

## 2022-11-15 NOTE — Assessment & Plan Note (Signed)
Chronic, ongoing.  They report he did not complete full treatment.  Referral to GI placed.

## 2022-11-15 NOTE — Assessment & Plan Note (Signed)
Chronic, ongoing.  Initial BP elevated, but repeat improved.  Poor adherence to medication regimen, maybe taking medications 2 times a week.  Sister is main caregiver and reports he will not take when provided.  ?need for pill packs.  Will send CCM referral for assist.  Recommend he monitor BP at least a few mornings a week at home and document.  DASH diet at home.  Continue current medication regimen and adjust as needed.  Labs today: CBC, CMP, TSH, BNP.  Return in 6 months.

## 2022-11-15 NOTE — Assessment & Plan Note (Signed)
Chronic, progressive.  Continue Exelon per neurology and discussed with his sister need to schedule follow-up with them to ensure ongoing continuity of care.  CCM referral placed.

## 2022-11-15 NOTE — Assessment & Plan Note (Signed)
Will check PSA today 

## 2022-11-15 NOTE — Assessment & Plan Note (Signed)
Chronic, ongoing.  Has upcoming visit with cardiology, highly recommend they attend as needs clearance for surgery, but concern due to poor adherence and health would be poor candidate for this at this time.  Continue to collaborate with cardiology and discussed with his sister and him importance of adherence to regimen.  CCM referral placed.

## 2022-11-15 NOTE — Assessment & Plan Note (Signed)
Chronic, ongoing.  Denies SI/HI.  No current medications and scoring is stable.  Initiate medication as needed in future.  Could consider Zoloft.

## 2022-11-15 NOTE — Assessment & Plan Note (Signed)
I have recommended complete cessation of tobacco use. I have discussed various options available for assistance with tobacco cessation including over the counter methods (Nicotine gum, patch and lozenges). We also discussed prescription options (Chantix, Nicotine Inhaler / Nasal Spray). The patient is not interested in pursuing any prescription tobacco cessation options at this time.  

## 2022-11-16 ENCOUNTER — Telehealth: Payer: Self-pay

## 2022-11-16 ENCOUNTER — Other Ambulatory Visit: Payer: Self-pay | Admitting: Nurse Practitioner

## 2022-11-16 LAB — COMPREHENSIVE METABOLIC PANEL
ALT: 18 IU/L (ref 0–44)
AST: 26 IU/L (ref 0–40)
Albumin/Globulin Ratio: 0.9 — ABNORMAL LOW (ref 1.2–2.2)
Albumin: 3.5 g/dL — ABNORMAL LOW (ref 3.9–4.9)
Alkaline Phosphatase: 107 IU/L (ref 44–121)
BUN/Creatinine Ratio: 13 (ref 10–24)
BUN: 13 mg/dL (ref 8–27)
Bilirubin Total: 0.6 mg/dL (ref 0.0–1.2)
CO2: 26 mmol/L (ref 20–29)
Calcium: 9.3 mg/dL (ref 8.6–10.2)
Chloride: 104 mmol/L (ref 96–106)
Creatinine, Ser: 1.02 mg/dL (ref 0.76–1.27)
Globulin, Total: 3.8 g/dL (ref 1.5–4.5)
Glucose: 77 mg/dL (ref 70–99)
Potassium: 4.3 mmol/L (ref 3.5–5.2)
Sodium: 139 mmol/L (ref 134–144)
Total Protein: 7.3 g/dL (ref 6.0–8.5)
eGFR: 80 mL/min/{1.73_m2} (ref >59)

## 2022-11-16 LAB — CBC WITH DIFFERENTIAL/PLATELET
Basophils Absolute: 0 10*3/uL (ref 0.0–0.2)
Basos: 1 %
EOS (ABSOLUTE): 0.1 10*3/uL (ref 0.0–0.4)
Eos: 3 %
Hematocrit: 38.3 % (ref 37.5–51.0)
Hemoglobin: 13.2 g/dL (ref 13.0–17.7)
Immature Grans (Abs): 0 10*3/uL (ref 0.0–0.1)
Immature Granulocytes: 0 %
Lymphocytes Absolute: 1.1 10*3/uL (ref 0.7–3.1)
Lymphs: 30 %
MCH: 30.8 pg (ref 26.6–33.0)
MCHC: 34.5 g/dL (ref 31.5–35.7)
MCV: 90 fL (ref 79–97)
Monocytes Absolute: 0.4 10*3/uL (ref 0.1–0.9)
Monocytes: 10 %
Neutrophils Absolute: 2.1 10*3/uL (ref 1.4–7.0)
Neutrophils: 56 %
Platelets: 194 10*3/uL (ref 150–450)
RBC: 4.28 x10E6/uL (ref 4.14–5.80)
RDW: 11.5 % — ABNORMAL LOW (ref 11.6–15.4)
WBC: 3.7 10*3/uL (ref 3.4–10.8)

## 2022-11-16 LAB — LIPID PANEL W/O CHOL/HDL RATIO
Cholesterol, Total: 118 mg/dL (ref 100–199)
HDL: 43 mg/dL (ref >39)
LDL Chol Calc (NIH): 60 mg/dL (ref 0–99)
Triglycerides: 73 mg/dL (ref 0–149)
VLDL Cholesterol Cal: 15 mg/dL (ref 5–40)

## 2022-11-16 LAB — HEMOGLOBIN A1C
Est. average glucose Bld gHb Est-mCnc: 120 mg/dL
Hgb A1c MFr Bld: 5.8 % — ABNORMAL HIGH (ref 4.8–5.6)

## 2022-11-16 LAB — BRAIN NATRIURETIC PEPTIDE: BNP: 581.1 pg/mL — ABNORMAL HIGH (ref 0.0–100.0)

## 2022-11-16 LAB — TSH: TSH: 1.01 u[IU]/mL (ref 0.450–4.500)

## 2022-11-16 LAB — PSA: Prostate Specific Ag, Serum: 1.4 ng/mL (ref 0.0–4.0)

## 2022-11-16 LAB — VITAMIN D 25 HYDROXY (VIT D DEFICIENCY, FRACTURES): Vit D, 25-Hydroxy: 7 ng/mL — ABNORMAL LOW (ref 30.0–100.0)

## 2022-11-16 LAB — VITAMIN B12: Vitamin B-12: 397 pg/mL (ref 232–1245)

## 2022-11-16 MED ORDER — CHOLECALCIFEROL 1.25 MG (50000 UT) PO TABS: 1.0000 | ORAL_TABLET | ORAL | 4 refills | Status: DC

## 2022-11-16 MED ORDER — VITAMIN B-12 1000 MCG PO TABS: 1000.0000 ug | ORAL_TABLET | Freq: Every day | ORAL | 4 refills | Status: DC

## 2022-11-16 NOTE — Progress Notes (Signed)
  Chronic Care Management   Note  11/16/2022 Name: Blake [name] MRN: [id number] DOB: [birth year]  Blake [name] is a 67 y.o. year old male who is a primary care patient of Cannady, Dorie Rank, NP. I reached out to Blake [name] by phone today in response to a referral sent by Blake [name]'s PCP.  Blake [name] was given information about Chronic Care Management services today including:  CCM service includes personalized support from designated clinical staff supervised by the physician, including individualized plan of care and coordination with other care providers 24/7 contact phone numbers for assistance for urgent and routine care needs. Service will only be billed when office clinical staff spend 20 minutes or more in a month to coordinate care. Only one practitioner may furnish and bill the service in a calendar month. The patient may stop CCM services at amy time (effective at the end of the month) by phone call to the office staff. The patient will be responsible for cost sharing (co-pay) or up to 20% of the service fee (after annual deductible is met)  Blake [name]  agreedto scheduling an appointment with the CCM RN Case Manager   Follow up plan: Patient agreed to scheduled appointment with RN Case Manager on 11/21/2022 Pharm d 11/22/2022(date/time).   Penne Lash, RMA Care Guide [facility]  [place], [place] [address] Direct Dial: [phone] [email or web address]

## 2022-11-16 NOTE — Progress Notes (Signed)
Good morning, please let Blake [name] and his sister know labs have returned and ensure she took him for x-ray imaging as advised: - Vitamin D level is very low.  I am sending a higher dose weekly Vitamin D supplement for him to take to help with bone health. - BNP, heart testing, is elevated -- concern for acute heart failure issues -- please take the increase Lasix as I sent in and get the chest imaging. - B12 level a little on lower side of normal, I am sending in a supplement to take daily for this as is important for nervous system health. - Remainder of labs stable with exception of A1c, diabetes testing, which remains in prediabetic range.  Focus on diet and regular activity.  Any questions? Keep being amazing!!  Thank you for allowing me to participate in your care.  I appreciate you. Kindest regards, Daniel Ritthaler

## 2022-11-20 NOTE — Progress Notes (Signed)
Please let Javari know chest imaging on report states no acute findings.  On review of images there is some enlargement of the heart as expected with his heart health and some mild fluid noted in lungs.  I recommend continue current plan of care and if any worsening come to be seen sooner then scheduled.  Any questions?  Hope you are feeling better.

## 2022-11-21 ENCOUNTER — Telehealth: Payer: Medicare Other

## 2022-11-21 ENCOUNTER — Ambulatory Visit (INDEPENDENT_AMBULATORY_CARE_PROVIDER_SITE_OTHER): Payer: Medicare Other

## 2022-11-21 DIAGNOSIS — I5022 Chronic systolic (congestive) heart failure: Secondary | ICD-10-CM

## 2022-11-21 DIAGNOSIS — J432 Centrilobular emphysema: Secondary | ICD-10-CM

## 2022-11-21 DIAGNOSIS — F0390 Unspecified dementia without behavioral disturbance: Secondary | ICD-10-CM

## 2022-11-21 NOTE — Plan of Care (Signed)
Chronic Care Management Provider Comprehensive Care Plan    11/21/2022 Name: Blake [name] MRN: [id number] DOB: 24-Sep-1955  Referral to Chronic Care Management (CCM) services was placed by Provider:  Aura Dials, NP on Date: 11-15-2022.  Chronic Condition 1: Heart Failure Provider Assessment and Plan  Chronic, ongoing.  Poor adherence to medication regimen, maybe taking medications 2 times a week.  Sister is main caregiver and reports he will not take when provided.  ?need for pill packs.  Will send CCM referral for assist.  At this time he appears fluid overloaded.  Will obtain imaging chest and increase Lasix to 40 MG daily for one week.  Referral to get back into HF clinic.  Sees cardiology upcoming, although suspect poor candidate for surgery at this time.  Obtain CXR today as concern for exacerbation vs fluid overload -- currently am increasing Lasix, but will add on abx if imaging notes infection.  Recommend: - Reminded to call for an overnight weight gain of >2 pounds or a weekly weight gain of >5 pounds - not adding salt to food and read food labels. Reviewed the importance of keeping daily sodium intake to 2000mg  daily.  - Avoid Ibuprofen products         Relevant Medications    furosemide (LASIX) 20 MG tablet    metoprolol succinate (TOPROL-XL) 25 MG 24 hr tablet    atorvastatin (LIPITOR) 40 MG tablet    Other Relevant Orders    CBC with Differential/Platelet    Comprehensive metabolic panel    B Nat Peptide    AMB referral to CHF clinic    DG Chest 2 View    AMB Referral to Chronic Care Management Services     Expected Outcome/Goals Addressed This Visit (Provider CCM goals/Provider Assessment and plan   CCM (HEART FAILURE)  EXPECTED OUTCOME:  MONITOR, SELF-MANAGE AND REDUCE SYMPTOMS OF HEART FAILURE   Symptom Management Condition 1: Take all medications as prescribed Attend all scheduled provider appointments Call provider office for new concerns or questions   call the Suicide and Crisis Lifeline: 988 call the [place] National Suicide Prevention Lifeline: (318)823-0493 or TTY: (870) 119-8129 TTY 323-094-4096) to talk to a trained counselor call 1-800-273-TALK (toll free, 24 hour hotline) if experiencing a Mental Health or Behavioral Health Crisis  call office if I gain more than 2 pounds in one day or 5 pounds in one week keep legs up while sitting use salt in moderation watch for swelling in feet, ankles and legs every day weigh myself daily develop a rescue plan follow rescue plan if symptoms flare-up track symptoms and what helps feel better or worse dress right for the weather, hot or cold  Chronic Condition 2: COPD Provider Assessment and Plan  Chronic, ongoing in daily smoker.  Recommend complete cessation of smoking.  Poor adherence to medication regimen, maybe taking preventative inhaler 2 times a week.  Sister is main caregiver and reports he will not take when provided.  ?need for pill packs.  Will send CCM referral for assist.  Discussed with him importance of using Anoro daily as is preventative to help with symptoms and to use Albuterol only as needed, educated sister and him on this.  Would benefit lung CT cancer screening, but refuses at this time.  Obtain CXR today as concern for exacerbation vs fluid overload -- currently am increasing Lasix, but will add on abx if imaging notes infection.         Relevant Medications    umeclidinium-vilanterol (  ANORO ELLIPTA) 62.5-25 MCG/ACT AEPB    Other Relevant Orders    CBC with Differential/Platelet    Comprehensive metabolic panel    DG Chest 2 View    AMB Referral to Chronic Care Management Services         Expected Outcome/Goals Addressed This Visit (Provider CCM goals/Provider Assessment and plan   CCM (COPD) EXPECTED OUTCOME: MONITOR, SELF-MANAGE AND REDUCE SYMPTOMS OF COPD    Symptom Management Condition 2: Take all medications as prescribed Attend all scheduled provider  appointments Call provider office for new concerns or questions  call the Suicide and Crisis Lifeline: 988 call the [place] National Suicide Prevention Lifeline: 989-181-5392 or TTY: 201-339-0448 TTY 912-307-0933) to talk to a trained counselor call 1-800-273-TALK (toll free, 24 hour hotline) if experiencing a Mental Health or Behavioral Health Crisis  eliminate smoking in my home identify and avoid work-related triggers identify and remove indoor air pollutants limit outdoor activity during cold weather listen for public air quality announcements every day do breathing exercises every day develop a rescue plan eliminate symptom triggers at home follow rescue plan if symptoms flare-up  Chronic Condition 3: Dementia Provider Assessment and Plan  Chronic, progressive.  Continue Exelon per neurology and discussed with his sister need to schedule follow-up with them to ensure ongoing continuity of care.  CCM referral placed.           Relevant Medications    rivastigmine (EXELON) 3 MG capsule    Other Relevant Orders    CBC with Differential/Platelet    TSH    Vitamin B12    AMB Referral to Chronic Care Management Services     Expected Outcome/Goals Addressed This Visit (Provider CCM goals/Provider Assessment and plan   CCM Dementia EXPECTED OUTCOME: MONITOR, SELF-MANAGE AND REDUCE SYMPTOMS OF Dementia    Symptom Management Condition 3: Take all medications as prescribed Attend all scheduled provider appointments Call provider office for new concerns or questions  call the Suicide and Crisis Lifeline: 988 call the [place] National Suicide Prevention Lifeline: 808-130-2778 or TTY: 208 561 0670 TTY 9724127891) to talk to a trained counselor call 1-800-273-TALK (toll free, 24 hour hotline) if experiencing a Mental Health or Behavioral Health Crisis    Problem List Patient Active Problem List   Diagnosis Date Noted   IFG (impaired fasting glucose) 11/15/2022   Right inguinal  hernia 11/07/2022   Dementia without behavioral disturbance (HCC) 05/14/2022   Chronic pain of right knee 08/15/2021   Intellectual disability 04/15/2021   Benign prostatic hyperplasia with urinary obstruction 09/29/2015   Nicotine dependence, chewing tobacco, uncomplicated 09/29/2015   Clinical depression 09/29/2015   Essential (primary) hypertension 09/29/2015   Hypercholesteremia 09/29/2015   H/O acute myocardial infarction 09/29/2015   S/P CABG (coronary artery bypass graft) 09/16/2014   Coronary artery disease involving native coronary artery of native heart with other form of angina pectoris (HCC) 09/16/2014   Centrilobular emphysema (HCC) 09/16/2014   Chronic systolic CHF (congestive heart failure) (HCC) 09/16/2014   Chronic hepatitis C (HCC) 01/26/2011    Medication Management  Current Outpatient Medications:    Cholecalciferol 1.25 MG (50000 UT) TABS, Take 1 tablet by mouth once a week., Disp: 12 tablet, Rfl: 4   albuterol (VENTOLIN HFA) 108 (90 Base) MCG/ACT inhaler, Inhale 2 puffs into the lungs every 6 (six) hours as needed for wheezing or shortness of breath., Disp: 8 g, Rfl: 2   aspirin 81 MG chewable tablet, Chew 1 tablet (81 mg total) by mouth daily., Disp: 30 tablet,  Rfl: 11   atorvastatin (LIPITOR) 40 MG tablet, Take 1 tablet (40 mg total) by mouth daily., Disp: 90 tablet, Rfl: 0   cyanocobalamin (VITAMIN B12) 1000 MCG tablet, Take 1 tablet (1,000 mcg total) by mouth daily., Disp: 90 tablet, Rfl: 4   furosemide (LASIX) 20 MG tablet, Take 2 tablets (40 mg total) by mouth daily., Disp: 14 tablet, Rfl: 0   ibuprofen (ADVIL) 600 MG tablet, Take 1 tablet (600 mg total) by mouth every 8 (eight) hours as needed., Disp: 20 tablet, Rfl: 0   lisinopril (ZESTRIL) 2.5 MG tablet, Take 2.5 mg by mouth daily., Disp: , Rfl:    loratadine (CLARITIN) 10 MG tablet, Take 1 tablet (10 mg total) by mouth daily., Disp: 30 tablet, Rfl: 0   metoprolol succinate (TOPROL-XL) 25 MG 24 hr tablet,  Take 1 tablet (25 mg total) by mouth daily., Disp: 90 tablet, Rfl: 0   nitroGLYCERIN (NITROSTAT) 0.4 MG SL tablet, Place 1 tablet (0.4 mg total) under the tongue every 5 (five) minutes as needed for chest pain. Reported on 09/29/2015, Disp: 10 tablet, Rfl: 0   omeprazole (PRILOSEC) 20 MG capsule, Take 20 mg by mouth daily., Disp: , Rfl:    potassium chloride SA (KLOR-CON) 20 MEQ tablet, Take 1 tablet (20 mEq total) by mouth daily., Disp: 30 tablet, Rfl: 5   ranitidine (ZANTAC) 150 MG tablet, Take 1 tablet by mouth 2 (two) times daily., Disp: , Rfl:    rivastigmine (EXELON) 3 MG capsule, Take 1 capsule (3 mg total) by mouth 2 (two) times daily., Disp: 90 capsule, Rfl: 0   umeclidinium-vilanterol (ANORO ELLIPTA) 62.5-25 MCG/ACT AEPB, Inhale 1 puff into the lungs daily at 6 (six) AM., Disp: 60 each, Rfl: 4  Cognitive Assessment Identity Confirmed: : Name; DOB Cognitive Status: Abnormal (the patient has dementia, mild at this time)   Functional Assessment Hearing Difficulty or Deaf: yes Hearing Management: does not have hearing aides, reads lips Wear Glasses or Blind: no (has an appointment for eye doctor) Concentrating, Remembering or Making Decisions Difficulty (CP): yes Concentration Management: mild memory loss Difficulty Communicating: no Difficulty Eating/Swallowing: no Walking or Climbing Stairs Difficulty: no Dressing/Bathing Difficulty: no Doing Errands Independently Difficulty (such as shopping) (CP): yes Errands Management: dependent on others to help him with his needs   Caregiver Assessment  Primary Source of Support/Comfort: sibling(s) Name of Support/Comfort Primary Source: Lupita Leash- sister People in Home: sibling(s) (a 32 year old niece lives with them) Family Caregiver if Needed: sibling(s) Family Caregiver Names: Lupita Leash- sister Primary Roles/Responsibilities: retired   Planned Interventions  Provided patient with basic written and verbal COPD education on  self care/management/and exacerbation prevention Advised patient to track and manage COPD triggers Provided written and verbal instructions on pursed lip breathing and utilized returned demonstration as teach back Provided instruction about proper use of medications used for management of COPD including inhalers Advised patient to self assesses COPD action plan zone and make appointment with provider if in the yellow zone for 48 hours without improvement Advised patient to engage in light exercise as tolerated 3-5 days a week to aid in the the management of COPD Provided education about and advised patient to utilize infection prevention strategies to reduce risk of respiratory infection Discussed the importance of adequate rest and management of fatigue with COPD Screening for signs and symptoms of depression related to chronic disease state  Assessed social determinant of health barriers Evaluation of current treatment plan related to dementia and changes in memory  and patient's adherence to plan as established by provider Advised patient to call the office for changes in mood, anxiety, depression, and mental health concerns Provided education to patient re: resources to help with activities and socialization for the patient. The patient likes to sit and watch a lot of TV. The patient may benefit from going to the Senior Center: [facility] at [employer]. [address], [place], [place] [address], [phone]. Also his sister may call [facility] at [phone] for additional resources in [facility] Reviewed medications with patient and discussed compliance. The patients sister states that since the provider explained the reason he needed to take his medications he has been compliant with his medications and taking as directed. Has an appointment with the pharm D tomorrow at 3 pm for initial outreach.  Provided patient with local resources for [facility] and Elder care  educational materials related to resources to help with socialization and increasing activity Reviewed scheduled/upcoming provider appointments including 11-22-2022 at 1120 am Pharmacy referral for assistance with medication compliance, education and support. Has an appointment on 11-22-2022 with the pharm D Discussed plans with patient for ongoing care management follow up and provided patient with direct contact information for care management team Advised patient to discuss changes in mood, anxiety, depression, or memory  with provider Screening for signs and symptoms of depression related to chronic disease state  Assessed social determinant of health barriers  Basic overview and discussion of pathophysiology of Heart Failure reviewed Provided education on low sodium diet Assessed need for readable accurate scales in home Provided education about placing scale on hard, flat surface Advised patient to weigh each morning after emptying bladder Discussed importance of daily weight and advised patient to weigh and record daily Reviewed role of diuretics in prevention of fluid overload and management of heart failure Discussed the importance of keeping all appointments with provider Provided patient with education about the role of exercise in the management of heart failure Advised patient to discuss changes in fluid balance, swelling or edema and changes in heart health with provider Screening for signs and symptoms of depression related to chronic disease state  Assessed social determinant of health barriers     Interaction and coordination with outside resources, practitioners, and providers See CCM Referral  Care Plan: Printed and mailed to patient

## 2022-11-21 NOTE — Chronic Care Management (AMB) (Signed)
Chronic Care Management   CCM RN Visit Note  11/21/2022 Name: Blake [name] MRN: [id number] DOB: 1956-04-21  Subjective: Blake [name] is a 67 y.o. year old male who is a primary care patient of Cannady, Dorie Rank, NP. The patient was referred to the Chronic Care Management team for assistance with care management needs subsequent to provider initiation of CCM services and plan of care.    Today's Visit:   Spoke to the patients sister, and DPR, Blake [name]  for initial visit.     SDOH Interventions Today    Flowsheet Row Most Recent Value  SDOH Interventions   Food Insecurity Interventions Intervention Not Indicated  Housing Interventions Intervention Not Indicated  Transportation Interventions Intervention Not Indicated  Utilities Interventions Intervention Not Indicated  Alcohol Usage Interventions Intervention Not Indicated (Score <7)  Financial Strain Interventions Intervention Not Indicated  Physical Activity Interventions Intervention Not Indicated  Stress Interventions Intervention Not Indicated  Social Connections Interventions Intervention Not Indicated         Goals Addressed             This Visit's Progress    CCM Expected Outcome:  Monitor, Self-Manage and Reduce Symptoms of Heart Failure       Current Barriers:  Knowledge Deficits related to the importance of medication compliance and taking medications as directed for effective management of HF Chronic Disease Management support and education needs related to effective management of HF  Planned Interventions: Basic overview and discussion of pathophysiology of Heart Failure reviewed Provided education on low sodium diet Assessed need for readable accurate scales in home Provided education about placing scale on hard, flat surface Advised patient to weigh each morning after emptying bladder Discussed importance of daily weight and advised patient to weigh and record daily Reviewed role of  diuretics in prevention of fluid overload and management of heart failure Discussed the importance of keeping all appointments with provider Provided patient with education about the role of exercise in the management of heart failure Advised patient to discuss changes in fluid balance, swelling or edema and changes in heart health with provider Screening for signs and symptoms of depression related to chronic disease state  Assessed social determinant of health barriers  Symptom Management: Take medications as prescribed   Attend all scheduled provider appointments Call provider office for new concerns or questions  call the Suicide and Crisis Lifeline: 988 call the [place] National Suicide Prevention Lifeline: (904)081-4179 or TTY: 305 349 5995 TTY 938-646-3785) to talk to a trained counselor call 1-800-273-TALK (toll free, 24 hour hotline) if experiencing a Mental Health or Behavioral Health Crisis  call office if I gain more than 2 pounds in one day or 5 pounds in one week keep legs up while sitting use salt in moderation watch for swelling in feet, ankles and legs every day weigh myself daily develop a rescue plan follow rescue plan if symptoms flare-up track symptoms and what helps feel better or worse dress right for the weather, hot or cold  Follow Up Plan: Telephone follow up appointment with care management team member scheduled for: 01-10-2023 at 230 pm       CCM Expected Outcome:  Monitor, Self-Manage and Reduce Symptoms of: Dementia       Current Barriers:  Knowledge Deficits related to resources in the area for ongoing support and education for effective management of dementia and memory changes Care Coordination needs related to referral for pharm D and help with medication compliance  in a patient with  dementia  Chronic Disease Management support and education needs related to effective management of dementia  Planned Interventions: Evaluation of current treatment plan  related to dementia and changes in memory  and patient's adherence to plan as established by provider Advised patient to call the office for changes in mood, anxiety, depression, and mental health concerns Provided education to patient re: resources to help with activities and socialization for the patient. The patient likes to sit and watch a lot of TV. The patient may benefit from going to the Senior Center: [facility] at [employer]. [address] [place], [place] [address], [phone]. Also his sister may call [facility] at [phone] for additional resources in [facility] Reviewed medications with patient and discussed compliance. The patients sister states that since the provider explained the reason he needed to take his medications he has been compliant with his medications and taking as directed. Has an appointment with the pharm D tomorrow at 3 pm for initial outreach.  Provided patient with local resources for [facility] and Elder care educational materials related to resources to help with socialization and increasing activity Reviewed scheduled/upcoming provider appointments including 11-22-2022 at 1120 am Pharmacy referral for assistance with medication compliance, education and support. Has an appointment on 11-22-2022 with the pharm D Discussed plans with patient for ongoing care management follow up and provided patient with direct contact information for care management team Advised patient to discuss changes in mood, anxiety, depression, or memory  with provider Screening for signs and symptoms of depression related to chronic disease state  Assessed social determinant of health barriers  Symptom Management: Take medications as prescribed   Attend all scheduled provider appointments Call provider office for new concerns or questions  call the Suicide and Crisis Lifeline: 988 call the [place] National Suicide Prevention Lifeline: 231-355-9227 or TTY:  5164552093 TTY 831-418-8455) to talk to a trained counselor call 1-800-273-TALK (toll free, 24 hour hotline) if experiencing a Mental Health or Behavioral Health Crisis   Follow Up Plan: Telephone follow up appointment with care management team member scheduled for: 01-10-2023 at 230 pm       CCM:  Maintain, Monitor and Self-Manage Symptoms of COPD       Current Barriers:  Chronic Disease Management support and education needs related to effective management of COPD  Planned Interventions: Provided patient with basic written and verbal COPD education on self care/management/and exacerbation prevention Advised patient to track and manage COPD triggers Provided written and verbal instructions on pursed lip breathing and utilized returned demonstration as teach back Provided instruction about proper use of medications used for management of COPD including inhalers Advised patient to self assesses COPD action plan zone and make appointment with provider if in the yellow zone for 48 hours without improvement Advised patient to engage in light exercise as tolerated 3-5 days a week to aid in the the management of COPD Provided education about and advised patient to utilize infection prevention strategies to reduce risk of respiratory infection Discussed the importance of adequate rest and management of fatigue with COPD Screening for signs and symptoms of depression related to chronic disease state  Assessed social determinant of health barriers  Symptom Management: Take medications as prescribed   Attend all scheduled provider appointments Call provider office for new concerns or questions  call the Suicide and Crisis Lifeline: 988 call the [place] National Suicide Prevention Lifeline: 503-727-6093 or TTY: (801) 449-2670 TTY 503-802-2581) to talk to a trained counselor call 1-800-273-TALK (toll free,  24 hour hotline) if experiencing a Mental Health or Behavioral Health Crisis  avoid second  hand smoke eliminate smoking in my home identify and remove indoor air pollutants limit outdoor activity during cold weather listen for public air quality announcements every day develop a rescue plan eliminate symptom triggers at home follow rescue plan if symptoms flare-up  Follow Up Plan: Telephone follow up appointment with care management team member scheduled for: 01-10-2023 at 230 pm          Plan:Telephone follow up appointment with care management team member scheduled for:  01-10-2023 at 230 pm  Alto Denver RN, MSN, CCM RN Care Manager  Chronic Care Management Direct Number: [phone]

## 2022-11-21 NOTE — Patient Instructions (Addendum)
Please call the care guide team at [phone] if you need to cancel or reschedule your appointment.   If you are experiencing a Mental Health or Behavioral Health Crisis or need someone to talk to, please call the Suicide and Crisis Lifeline: 988 call the [place] National Suicide Prevention Lifeline: (303)571-9196 or TTY: 639 066 0331 TTY 3020107346) to talk to a trained counselor call 1-800-273-TALK (toll free, 24 hour hotline)   Following is a copy of the CCM Program Consent:  CCM service includes personalized support from designated clinical staff supervised by the physician, including individualized plan of care and coordination with other care providers 24/7 contact phone numbers for assistance for urgent and routine care needs. Service will only be billed when office clinical staff spend 20 minutes or more in a month to coordinate care. Only one practitioner may furnish and bill the service in a calendar month. The patient may stop CCM services at amy time (effective at the end of the month) by phone call to the office staff. The patient will be responsible for cost sharing (co-pay) or up to 20% of the service fee (after annual deductible is met)  Following is a copy of your full provider care plan:   Goals Addressed             This Visit's Progress    CCM Expected Outcome:  Monitor, Self-Manage and Reduce Symptoms of Heart Failure       Current Barriers:  Knowledge Deficits related to the importance of medication compliance and taking medications as directed for effective management of HF Chronic Disease Management support and education needs related to effective management of HF  Planned Interventions: Basic overview and discussion of pathophysiology of Heart Failure reviewed Provided education on low sodium diet Assessed need for readable accurate scales in home Provided education about placing scale on hard, flat surface Advised patient to weigh each morning after  emptying bladder Discussed importance of daily weight and advised patient to weigh and record daily Reviewed role of diuretics in prevention of fluid overload and management of heart failure Discussed the importance of keeping all appointments with provider Provided patient with education about the role of exercise in the management of heart failure Advised patient to discuss changes in fluid balance, swelling or edema and changes in heart health with provider Screening for signs and symptoms of depression related to chronic disease state  Assessed social determinant of health barriers  Symptom Management: Take medications as prescribed   Attend all scheduled provider appointments Call provider office for new concerns or questions  call the Suicide and Crisis Lifeline: 988 call the [place] National Suicide Prevention Lifeline: 308-165-1406 or TTY: (430) 223-0074 TTY (351)205-3675) to talk to a trained counselor call 1-800-273-TALK (toll free, 24 hour hotline) if experiencing a Mental Health or Behavioral Health Crisis  call office if I gain more than 2 pounds in one day or 5 pounds in one week keep legs up while sitting use salt in moderation watch for swelling in feet, ankles and legs every day weigh myself daily develop a rescue plan follow rescue plan if symptoms flare-up track symptoms and what helps feel better or worse dress right for the weather, hot or cold  Follow Up Plan: Telephone follow up appointment with care management team member scheduled for: 01-10-2023 at 230 pm       CCM Expected Outcome:  Monitor, Self-Manage and Reduce Symptoms of: Dementia       Current Barriers:  Knowledge Deficits related to resources in  the area for ongoing support and education for effective management of dementia and memory changes Care Coordination needs related to referral for pharm D and help with medication compliance  in a patient with dementia  Chronic Disease Management support and  education needs related to effective management of dementia  Planned Interventions: Evaluation of current treatment plan related to dementia and changes in memory  and patient's adherence to plan as established by provider Advised patient to call the office for changes in mood, anxiety, depression, and mental health concerns Provided education to patient re: resources to help with activities and socialization for the patient. The patient likes to sit and watch a lot of TV. The patient may benefit from going to the [facility]: [facility] [employer]. [address], [place], [place] [address], [phone]. Also his sister may call [facility] at [phone] for additional resources in [facility] Reviewed medications with patient and discussed compliance. The patients sister states that since the provider explained the reason he needed to take his medications he has been compliant with his medications and taking as directed. Has an appointment with the pharm D tomorrow at 3 pm for initial outreach.  Provided patient with local resources for [facility] and Elder care educational materials related to resources to help with socialization and increasing activity Reviewed scheduled/upcoming provider appointments including 11-22-2022 at 1120 am Pharmacy referral for assistance with medication compliance, education and support. Has an appointment on 11-22-2022 with the pharm D Discussed plans with patient for ongoing care management follow up and provided patient with direct contact information for care management team Advised patient to discuss changes in mood, anxiety, depression, or memory  with provider Screening for signs and symptoms of depression related to chronic disease state  Assessed social determinant of health barriers  Symptom Management: Take medications as prescribed   Attend all scheduled provider appointments Call provider office for new concerns or questions   call the Suicide and Crisis Lifeline: 988 call the [place] National Suicide Prevention Lifeline: 412-659-5445 or TTY: 938-572-9670 TTY 517-830-6346) to talk to a trained counselor call 1-800-273-TALK (toll free, 24 hour hotline) if experiencing a Mental Health or Behavioral Health Crisis   Follow Up Plan: Telephone follow up appointment with care management team member scheduled for: 01-10-2023 at 230 pm       CCM:  Maintain, Monitor and Self-Manage Symptoms of COPD       Current Barriers:  Chronic Disease Management support and education needs related to effective management of COPD  Planned Interventions: Provided patient with basic written and verbal COPD education on self care/management/and exacerbation prevention Advised patient to track and manage COPD triggers Provided written and verbal instructions on pursed lip breathing and utilized returned demonstration as teach back Provided instruction about proper use of medications used for management of COPD including inhalers Advised patient to self assesses COPD action plan zone and make appointment with provider if in the yellow zone for 48 hours without improvement Advised patient to engage in light exercise as tolerated 3-5 days a week to aid in the the management of COPD Provided education about and advised patient to utilize infection prevention strategies to reduce risk of respiratory infection Discussed the importance of adequate rest and management of fatigue with COPD Screening for signs and symptoms of depression related to chronic disease state  Assessed social determinant of health barriers  Symptom Management: Take medications as prescribed   Attend all scheduled provider appointments Call provider office for new  concerns or questions  call the Suicide and Crisis Lifeline: 988 call the [place] National Suicide Prevention Lifeline: 720-795-6597 or TTY: 5865898609 TTY (518) 680-4080) to talk to a trained counselor call  1-800-273-TALK (toll free, 24 hour hotline) if experiencing a Mental Health or Behavioral Health Crisis  avoid second hand smoke eliminate smoking in my home identify and remove indoor air pollutants limit outdoor activity during cold weather listen for public air quality announcements every day develop a rescue plan eliminate symptom triggers at home follow rescue plan if symptoms flare-up  Follow Up Plan: Telephone follow up appointment with care management team member scheduled for: 01-10-2023 at 230 pm          The patient verbalized understanding of instructions, educational materials, and care plan provided today and agreed to receive a mailed copy of patient instructions, educational materials, and care plan.  Telephone follow up appointment with care management team member scheduled for: 01-10-2023 at 230 pm  Alto Denver RN, MSN, CCM RN Care Manager  Chronic Care Management Direct Number: [phone]   Dementia Caregiver Guide Dementia is a term used to describe a number of symptoms that affect memory and thinking. The most common symptoms include: Memory loss. Trouble with language and communication. Trouble concentrating. Poor judgment and problems with reasoning. Wandering from home or public places. Extreme anxiety or depression. Being suspicious or having angry outbursts and accusations. Child-like behavior and language. Dementia can be frightening and confusing. And taking care of someone with dementia can be challenging. This guide provides tips to help you when providing care for a person with dementia. How to help manage lifestyle changes Dementia usually gets worse slowly over time. In the early stages, people with dementia can stay independent and safe with some help. In later stages, they need help with daily tasks such as dressing, grooming, and using the bathroom. There are actions you can take to help a person manage his or her life while living with this  condition. Communicating When the person is talking or seems frustrated, make eye contact and hold the person's hand. Ask specific questions that need yes or no answers. Use simple words, short sentences, and a calm voice. Only give one direction at a time. When offering choices, limit the person to just one or two. Avoid correcting the person in a negative way. If the person is struggling to find the right words, gently try to help him or her. Preventing injury  Keep floors clear of clutter. Remove rugs, magazine racks, and floor lamps. Keep hallways well lit, especially at night. Put a handrail and nonslip mat in the bathtub or shower. Put childproof locks on cabinets that contain dangerous items, such as medicines, alcohol, guns, toxic cleaning items, sharp tools or utensils, matches, and lighters. For doors to the outside of the house, put the locks in places where the person cannot see or reach them easily. This will help ensure that the person does not wander out of the house and get lost. Be prepared for emergencies. Keep a list of emergency phone numbers and addresses in a convenient area. Remove car keys and lock garage doors so that the person does not try to get in the car and drive. Have the person wear a bracelet that tracks locations and identifies the person as having memory problems. This should be worn at all times for safety. Helping with daily life  Keep the person on track with his or her routine. Try to identify areas where the person  may need help. Be supportive, patient, calm, and encouraging. Gently remind the person that adjusting to changes takes time. Help with the tasks that the person has asked for help with. Keep the person involved in daily tasks and decisions as much as possible. Encourage conversation, but try not to get frustrated if the person struggles to find words or does not seem to appreciate your help. How to recognize stress Look for signs of  stress in yourself and in the person you are caring for. If you notice signs of stress, take steps to manage it. Symptoms of stress include: Feeling anxious, irritable, frustrated, or angry. Denying that the person has dementia or that his or her symptoms will not improve. Feeling depressed, hopeless, or unappreciated. Difficulty sleeping. Difficulty concentrating. Developing stress-related health problems. Feeling like you have too little time for your own life. Follow these instructions at home: Take care of your health Make sure that you and the person you are caring for: Get regular sleep. Exercise regularly. Eat regular, nutritious meals. Take over-the-counter and prescription medicines only as told by your health care providers. Drink enough fluid to keep your urine pale yellow. Attend all scheduled health care appointments.  General instructions Join a support group with others who are caregivers. Ask about respite care resources. Respite care can provide short-term care for the person so that you can have a regular break from the stress of caregiving. Consider any safety risks and take steps to avoid them. Organize medicines in a pill box for each day of the week. Create a plan to handle any legal or financial matters. Get legal or financial advice if needed. Keep a calendar in a central location to remind the person of appointments or other activities. Where to find support: Many individuals and organizations offer support. These include: Support groups for people with dementia. Support groups for caregivers. Counselors or therapists. Home health care services. Adult day care centers. Where to find more information Centers for Disease Control and Prevention: FootballExhibition.com.br Alzheimer's Association: LimitLaws.hu Family Caregiver Alliance: www.caregiver.org Alzheimer's Foundation of [place]: www.alzfdn.org Contact a health care provider if: The person's health is rapidly  getting worse. You are no longer able to care for the person. Caring for the person is affecting your physical and emotional health. You are feeling depressed or anxious about caring for the person. Get help right away if: The person threatens himself or herself, you, or anyone else. You feel depressed or sad, or feel that you want to harm yourself. If you ever feel like your loved one may hurt himself or herself or others, or if he or she shares thoughts about taking his or her own life, get help right away. You can go to your nearest emergency department or: Call your local emergency services (911 in the [place] Call a suicide crisis helpline, such as the National Suicide Prevention Lifeline at (434)739-8418 or 988 in the [place] This is open 24 hours a day in the [place] Text the Crisis Text Line at 5206950531 (in the [place] Summary Dementia is a term used to describe a number of symptoms that affect memory and thinking. Dementia usually gets worse slowly over time. Take steps to reduce the person's risk of injury and to plan for future care. Caregivers need support, relief from caregiving, and time for their own lives. This information is not intended to replace advice given to you by your health care provider. Make sure you discuss any questions you have with your health care provider.  Document Revised: 01/19/2021 Document Reviewed: 11/10/2019 Elsevier Patient Education  2023 Elsevier Inc. Memory Compensation Strategies  Use "WARM" strategy.  W= write it down  A= associate it  R= repeat it  M= make a mental note  2.   You can keep a Glass blower/designer.  Use a 3-ring notebook with sections for the following: calendar, important names and phone numbers,  medications, doctors' names/phone numbers, lists/reminders, and a section to journal what you did  each day.   3.    Use a calendar to write appointments down.  4.    Write yourself a schedule for the day.  This can be placed on the calendar  or in a separate section of the Memory Notebook.  Keeping a  regular schedule can help memory.  5.    Use medication organizer with sections for each day or morning/evening pills.  You may need help loading it  6.    Keep a basket, or pegboard by the door.  Place items that you need to take out with you in the basket or on the pegboard.  You may also want to  include a message board for reminders.  7.    Use sticky notes.  Place sticky notes with reminders in a place where the task is performed.  For example: " turn off the  stove" placed by the stove, "lock the door" placed on the door at eye level, " take your medications" on  the bathroom mirror or by the place where you normally take your medications.  8.    Use alarms/timers.  Use while cooking to remind yourself to check on food or as a reminder to take your medicine, or as a  reminder to make a call, or as a reminder to perform another task, etc. COPD and Physical Activity Chronic obstructive pulmonary disease (COPD) is a long-term, or chronic, condition that affects the lungs. COPD is a general term that can be used to describe many problems that cause inflammation of the lungs and limit airflow. These conditions include chronic bronchitis and emphysema. The main symptom of COPD is shortness of breath, which makes it harder to do even simple tasks. This can also make it harder to exercise and stay active. Talk with your health care provider about treatments to help you breathe better and actions you can take to prevent breathing problems during physical activity. What are the benefits of exercising when you have COPD? Exercising regularly is an important part of a healthy lifestyle. You can still exercise and do physical activities even though you have COPD. Exercise and physical activity improve your shortness of breath by increasing blood flow (circulation). This causes your heart to pump more oxygen through your body. Moderate exercise  can: Improve oxygen use. Increase your energy level. Help with shortness of breath. Strengthen your breathing muscles. Improve heart health. Help with sleep. Improve your self-esteem and feelings of self-worth. Lower depression, stress, and anxiety. Exercise can benefit everyone with COPD. The severity of your disease may affect how hard you can exercise, especially at first, but everyone can benefit. Talk with your health care provider about how much exercise is safe for you, and which activities and exercises are safe for you. What actions can I take to prevent breathing problems during physical activity? Sign up for a pulmonary rehabilitation program. This type of program may include: Education about lung diseases. Exercise classes that teach you how to exercise and be more active while improving your breathing.  This usually involves: Exercise using your lower extremities, such as a stationary bicycle. About 30 minutes of exercise, 2 to 5 times per week, for 6 to 12 weeks. Strength training, such as push-ups or leg lifts. Nutrition education. Group classes in which you can talk with others who also have COPD and learn ways to manage stress. If you use an oxygen tank, you should use it while you exercise. Work with your health care provider to adjust your oxygen for your physical activity. Your resting flow rate is different from your flow rate during physical activity. How to manage your breathing while exercising While you are exercising: Take slow breaths. Pace yourself, and do nottry to go too fast. Purse your lips while breathing out. Pursing your lips is similar to a kissing or whistling position. If doing exercise that uses a quick burst of effort, such as weight lifting: Breathe in before starting the exercise. Breathe out during the hardest part of the exercise, such as raising the weights. Where to find support You can find support for exercising with COPD from: Your health  care provider. A pulmonary rehabilitation program. Your local health department or community health programs. Support groups, either online or in-person. Your health care provider may be able to recommend support groups. Where to find more information You can find more information about exercising with COPD from: American Lung Association: lung.org COPD Foundation: copdfoundation.org Contact a health care provider if: Your symptoms get worse. You have nausea. You have a fever. You want to start a new exercise program or a new activity. Get help right away if: You have chest pain. You cannot breathe. These symptoms may represent a serious problem that is an emergency. Do not wait to see if the symptoms will go away. Get medical help right away. Call your local emergency services (911 in the [place] Do not drive yourself to the hospital. Summary COPD is a general term that can be used to describe many different lung problems that cause lung inflammation and limit airflow. This includes chronic bronchitis and emphysema. Exercise and physical activity improve your shortness of breath by increasing blood flow (circulation). This causes your heart to provide more oxygen to your body. Contact your health care provider before starting any exercise program or new activity. Ask your health care provider what exercises and activities are safe for you. This information is not intended to replace advice given to you by your health care provider. Make sure you discuss any questions you have with your health care provider. Document Revised: 05/04/2020 Document Reviewed: 05/04/2020 Elsevier Patient Education  2023 Elsevier Inc. Heart-Healthy Eating Plan Eating a healthy diet is important for the health of your heart. A heart-healthy eating plan includes: Eating less unhealthy fats. Eating more healthy fats. Eating less salt in your food. Salt is also called sodium. Making other changes in your  diet. Talk with your doctor or a diet specialist (dietitian) to create an eating plan that is right for you. What is my plan? Your doctor may recommend an eating plan that includes: Total fat: ______% or less of total calories a day. Saturated fat: ______% or less of total calories a day. Cholesterol: less than _________mg a day. Sodium: less than _________mg a day. What are tips for following this plan? Cooking Avoid frying your food. Try to bake, boil, grill, or broil it instead. You can also reduce fat by: Removing the skin from poultry. Removing all visible fats from meats. Steaming vegetables in water or  broth. Meal planning  At meals, divide your plate into four equal parts: Fill one-half of your plate with vegetables and green salads. Fill one-fourth of your plate with whole grains. Fill one-fourth of your plate with lean protein foods. Eat 2-4 cups of vegetables per day. One cup of vegetables is: 1 cup (91 g) broccoli or cauliflower florets. 2 medium carrots. 1 large bell pepper. 1 large sweet potato. 1 large tomato. 1 medium white potato. 2 cups (150 g) raw leafy greens. Eat 1-2 cups of fruit per day. One cup of fruit is: 1 small apple 1 large banana 1 cup (237 g) mixed fruit, 1 large orange,  cup (82 g) dried fruit, 1 cup (240 mL) 100% fruit juice. Eat more foods that have soluble fiber. These are apples, broccoli, carrots, beans, peas, and barley. Try to get 20-30 g of fiber per day. Eat 4-5 servings of nuts, legumes, and seeds per week: 1 serving of dried beans or legumes equals  cup (90 g) cooked. 1 serving of nuts is  oz (12 almonds, 24 pistachios, or 7 walnut halves). 1 serving of seeds equals  oz (8 g). General information Eat more home-cooked food. Eat less restaurant, buffet, and fast food. Limit or avoid alcohol. Limit foods that are high in starch and sugar. Avoid fried foods. Lose weight if you are overweight. Keep track of how much salt  (sodium) you eat. This is important if you have high blood pressure. Ask your doctor to tell you more about this. Try to add vegetarian meals each week. Fats Choose healthy fats. These include olive oil and canola oil, flaxseeds, walnuts, almonds, and seeds. Eat more omega-3 fats. These include salmon, mackerel, sardines, tuna, flaxseed oil, and ground flaxseeds. Try to eat fish at least 2 times each week. Check food labels. Avoid foods with trans fats or high amounts of saturated fat. Limit saturated fats. These are often found in animal products, such as meats, butter, and cream. These are also found in plant foods, such as palm oil, palm kernel oil, and coconut oil. Avoid foods with partially hydrogenated oils in them. These have trans fats. Examples are stick margarine, some tub margarines, cookies, crackers, and other baked goods. What foods should I eat? Fruits All fresh, canned (in natural juice), or frozen fruits. Vegetables Fresh or frozen vegetables (raw, steamed, roasted, or grilled). Green salads. Grains Most grains. Choose whole wheat and whole grains most of the time. Rice and pasta, including brown rice and pastas made with whole wheat. Meats and other proteins Lean, well-trimmed beef, veal, pork, and lamb. Chicken and [place] without skin. All fish and shellfish. Wild duck, rabbit, pheasant, and venison. Egg whites or low-cholesterol egg substitutes. Dried beans, peas, lentils, and tofu. Seeds and most nuts. Dairy Low-fat or nonfat cheeses, including ricotta and mozzarella. Skim or 1% milk that is liquid, powdered, or evaporated. Buttermilk that is made with low-fat milk. Nonfat or low-fat yogurt. Fats and oils Non-hydrogenated (trans-free) margarines. Vegetable oils, including soybean, sesame, sunflower, olive, peanut, safflower, corn, canola, and cottonseed. Salad dressings or mayonnaise made with a vegetable oil. Beverages Mineral water. Coffee and tea. Diet carbonated  beverages. Sweets and desserts Sherbet, gelatin, and fruit ice. Small amounts of dark chocolate. Limit all sweets and desserts. Seasonings and condiments All seasonings and condiments. The items listed above may not be a complete list of foods and drinks you can eat. Contact a dietitian for more options. What foods should I avoid? Fruits Canned fruit in heavy  syrup. Fruit in cream or butter sauce. Fried fruit. Limit coconut. Vegetables Vegetables cooked in cheese, cream, or butter sauce. Fried vegetables. Grains Breads that are made with saturated or trans fats, oils, or whole milk. Croissants. Sweet rolls. Donuts. High-fat crackers, such as cheese crackers. Meats and other proteins Fatty meats, such as hot dogs, ribs, sausage, bacon, rib-eye roast or steak. High-fat deli meats, such as salami and bologna. Caviar. Domestic duck and goose. Organ meats, such as liver. Dairy Cream, sour cream, cream cheese, and creamed cottage cheese. Whole-milk cheeses. Whole or 2% milk that is liquid, evaporated, or condensed. Whole buttermilk. Cream sauce or high-fat cheese sauce. Yogurt that is made from whole milk. Fats and oils Meat fat, or shortening. Cocoa butter, hydrogenated oils, palm oil, coconut oil, palm kernel oil. Solid fats and shortenings, including bacon fat, salt pork, lard, and butter. Nondairy cream substitutes. Salad dressings with cheese or sour cream. Beverages Regular sodas and juice drinks with added sugar. Sweets and desserts Frosting. Pudding. Cookies. Cakes. Pies. Milk chocolate or white chocolate. Buttered syrups. Full-fat ice cream or ice cream drinks. The items listed above may not be a complete list of foods and drinks to avoid. Contact a dietitian for more information. Summary Heart-healthy meal planning includes eating less unhealthy fats, eating more healthy fats, and making other changes in your diet. Eat a balanced diet. This includes fruits and vegetables, low-fat or  nonfat dairy, lean protein, nuts and legumes, whole grains, and heart-healthy oils and fats. This information is not intended to replace advice given to you by your health care provider. Make sure you discuss any questions you have with your health care provider. Document Revised: 08/01/2021 Document Reviewed: 08/01/2021 Elsevier Patient Education  2023 Elsevier Inc. Heart Failure Action Plan A heart failure action plan helps you understand what to do when you have symptoms of heart failure. Your action plan is a color-coded plan that lists the symptoms to watch for and indicates what actions to take. If you have symptoms in the red zone, you need medical care right away. If you have symptoms in the yellow zone, you are having problems. If you have symptoms in the green zone, you are doing well. Follow the plan that was created by you and your health care provider. Review your plan each time you visit your health care provider. Red zone These signs and symptoms mean you should get medical help right away: You have trouble breathing when resting. You have a dry cough that is getting worse. You have swelling or pain in your legs or abdomen that is getting worse. You suddenly gain more than 2-3 lb (0.9-1.4 kg) in 24 hours, or more than 5 lb (2.3 kg) in a week. This amount may be more or less depending on your condition. You have trouble staying awake or you feel confused. You have chest pain. You do not have an appetite. You pass out. You have worsening sadness or depression. If you have any of these symptoms, call your local emergency services (911 in the [place] right away. Do not drive yourself to the hospital. Yellow zone These signs and symptoms mean your condition may be getting worse and you should make some changes: You have trouble breathing when you are active, or you need to sleep with your head raised on extra pillows to help you breathe. You have swelling in your legs or  abdomen. You gain 2-3 lb (0.9-1.4 kg) in 24 hours, or 5 lb (2.3 kg) in a  week. This amount may be more or less depending on your condition. You get tired easily. You have trouble sleeping. You have a dry cough. If you have any of these symptoms: Contact your health care provider within the next day. Your health care provider may adjust your medicines. Green zone These signs mean you are doing well and can continue what you are doing: You do not have shortness of breath. You have very little swelling or no new swelling. Your weight is stable (no gain or loss). You have a normal activity level. You do not have chest pain or any other new symptoms. Follow these instructions at home: Take over-the-counter and prescription medicines only as told by your health care provider. Weigh yourself daily. Your target weight is __________ lb (__________ kg). Call your health care provider if you gain more than __________ lb (__________ kg) in 24 hours, or more than __________ lb (__________ kg) in a week. Health care provider name: _____________________________________________________ Health care provider phone number: _____________________________________________________ Eat a heart-healthy diet. Work with a diet and nutrition specialist (dietitian) to create an eating plan that is best for you. Keep all follow-up visits. This is important. Where to find more information American Heart Association: Summary A heart failure action plan helps you understand what to do when you have symptoms of heart failure. Follow the action plan that was created by you and your health care provider. Get help right away if you have any symptoms in the red zone. This information is not intended to replace advice given to you by your health care provider. Make sure you discuss any questions you have with your health care provider. Document Revised: 10/04/2021 Document Reviewed: 02/09/2020 Elsevier Patient Education   2023 [employer]. Exercise Information for Aging Adults Staying physically active is important as you age. Physical activity and exercise can help in maintaining quality of life, health, physical function, and reducing falls. The four types of exercises that are best for older adults are endurance, strength, balance, and flexibility. Contact your health care provider before you start any exercise routine. Ask your health care provider what activities are safe for you. What are the risks? Risks associated with exercising include: Overdoing it. This may lead to sore muscles or fatigue. Falls. Injuries. Dehydration. How to do these exercises Endurance exercises Endurance (aerobic) exercises raise your breathing rate and heart rate. Increasing your endurance helps you do everyday tasks and stay healthy. By improving the health of your body system that includes your heart, lungs, and blood vessels (circulatory system), you may also delay or prevent diseases such as heart disease, diabetes, and weak bones (osteoporosis). Types of endurance exercises include: Sports. Indoor activities, such as using gym equipment, doing water aerobics, or dancing. Outdoor activities, such as biking or jogging. Tasks around the house, such as gardening, yard work, and heavy household chores like cleaning. Walking, such as hiking or walking around your neighborhood. When doing endurance exercises, make sure you: Are aware of your surroundings. Use safety equipment as directed. Dress in layers when exercising outdoors. Drink plenty of water to stay well hydrated. Build up endurance slowly. Start with 10 minutes at a time, and gradually build up to doing 30 minutes at a time. Unless your health care provider gave you different instructions, aim to exercise for a total of 150 minutes a week. Spread out that time so you are working on endurance 3 or more days a week. Strength exercises Lifting, pulling, or pushing  weights helps to  strengthen muscles. Having stronger muscles makes it easier to do everyday activities, such as getting up from a chair, climbing stairs, carrying groceries, and playing with grandchildren. Strength exercises include arm and leg exercises that may be done: With weights. Without weights (using your own body weight). With a resistance band. When doing strength exercises: Move smoothly and steadily. Do not suddenly thrust or jerk the weights, the resistance band, or your body. Start with no weights or with light weights, and gradually add more weight over time. Eventually, aim to use weights that are hard or very hard for you to lift. This means that you are able to do 8 repetitions with the weight, and the last few repetitions are very challenging. Lift or push weights into position for 3 seconds, hold the position for 1 second, and then take 3 seconds to return to your starting position. Breathe out (exhale) during difficult movements, like lifting or pushing weights. Breathe in (inhale) to relax your muscles before the next repetition. Consider alternating arms or legs, especially when you first start strength exercises. Expect some slight muscle soreness after each session. Do strength exercises on 2 or more days a week, for 30 minutes at a time. Avoid exercising the same muscle groups two days in a row. For example, if you work on your leg muscles one day, work on your arm muscles the next day. When you can do two sets of 10-15 repetitions with a certain weight, increase the amount of weight. Balance exercises Balance exercises can help to prevent falls. Balance exercises include: Standing on one foot. Heel-to-toe walk. Balance walk. Tai chi. Make sure you have something sturdy to hold onto while doing balance exercises, such as a sturdy chair. As your balance improves, challenge yourself by holding on to the chair with one hand instead of two, and then with no hands. Trying  exercises with your eyes closed also challenges your balance, but be sure to have a sturdy surface (like a countertop) close by in case you need it. Do balance exercises as often as you want, or as often as directed by your health care provider. Flexibility exercises  Flexibility exercises improve how far you can bend, straighten, move, or rotate parts of your body (range of motion). These exercises also help you do everyday activities such as getting dressed or reaching for objects. Flexibility exercises include stretching different parts of the body, and they may be done in a standing or seated position or on the floor. When stretching, make sure you: Keep a slight bend in your arms and legs. Avoid completely straightening ("locking") your joints. Do not stretch so far that you feel pain. You should feel a mild stretching feeling. You may try stretching farther as you become more flexible over time. Relax and breathe between stretches. Hold on to something sturdy for balance as needed. Hold each stretch for 10-30 seconds. Repeat each stretch 3-5 times. General safety tips Exercise in well-lit areas. Do not hold your breath during exercises or stretches. Warm up before exercising, and cool down after exercising. This can help prevent injury. Drink plenty of water during exercise or any activity that makes you sweat. If you are not sure if an exercise is safe for you, or you are not sure how to do an exercise, talk with your health care provider. This is especially important if you have had surgery on muscles, bones, or joints (orthopedic surgery). Where to find more information You can find more information about exercise  for older adults from: Your local health department, fitness center, or community center. These facilities may have programs for aging adults. [employer] on Aging: https://walker.com/ National Council on Aging: www.ncoa.org Summary Staying physically active is important  as you age. Doing endurance, strength, balance, and flexibility exercises can help in maintaining quality of life, health, physical function, and reducing falls. Make sure to contact your health care provider before you start any exercise routine. Ask your health care provider what activities are safe for you. This information is not intended to replace advice given to you by your health care provider. Make sure you discuss any questions you have with your health care provider. Document Revised: 11/08/2020 Document Reviewed: 11/08/2020 Elsevier Patient Education  2023 [employer].

## 2022-11-22 ENCOUNTER — Ambulatory Visit: Payer: Medicare Other | Admitting: Nurse Practitioner

## 2022-11-22 ENCOUNTER — Other Ambulatory Visit: Payer: Medicare Other

## 2022-11-22 NOTE — Progress Notes (Signed)
   11/22/2022  Patient ID: Blake [name], male   DOB: July 02, 1956, 67 y.o.   MRN: [id number]  Outreach attempt for scheduled telephone visit with patient's sister [name] unsuccessful.  Tried to call phone number provided by her as best to call; but it went straight to voicemail, and voicemail was full.  Was able to leave a message on phone number listed under [name]'s name in the EMR, though.  Provided my direct number to call at her convenience to reschedule.  Will try to reach again next week if I do not hear back.  [name], PharmD, DPLA

## 2022-11-27 ENCOUNTER — Ambulatory Visit: Payer: Medicare Other | Admitting: Nurse Practitioner

## 2022-11-28 NOTE — Progress Notes (Signed)
Request for Cardiology Clearance has been faxed to Dr Callwood at [facility].  

## 2022-11-30 ENCOUNTER — Telehealth: Payer: Self-pay

## 2022-11-30 NOTE — Telephone Encounter (Signed)
A cardiac clearance was received from Dr. Juliann Pares. Pt's risk assessment is low and is optimized for surgery. Pt is ok to hold aspirin for 7 days before procedure per Dr. Juliann Pares.

## 2022-12-01 ENCOUNTER — Telehealth: Payer: Self-pay | Admitting: Family

## 2022-12-01 ENCOUNTER — Encounter: Payer: Medicare Other | Admitting: Family

## 2022-12-01 NOTE — Telephone Encounter (Signed)
Patient did not show for his initial [facility] Failure Clinic appointment on 12/01/22.

## 2022-12-08 DIAGNOSIS — F17210 Nicotine dependence, cigarettes, uncomplicated: Secondary | ICD-10-CM

## 2022-12-08 DIAGNOSIS — F0390 Unspecified dementia without behavioral disturbance: Secondary | ICD-10-CM | POA: Diagnosis not present

## 2022-12-08 DIAGNOSIS — I5020 Unspecified systolic (congestive) heart failure: Secondary | ICD-10-CM

## 2022-12-08 DIAGNOSIS — J449 Chronic obstructive pulmonary disease, unspecified: Secondary | ICD-10-CM | POA: Diagnosis not present

## 2022-12-11 ENCOUNTER — Other Ambulatory Visit: Payer: Medicare Other

## 2022-12-11 ENCOUNTER — Other Ambulatory Visit (HOSPITAL_COMMUNITY): Payer: Self-pay

## 2022-12-11 ENCOUNTER — Other Ambulatory Visit: Payer: Self-pay

## 2022-12-11 DIAGNOSIS — I5022 Chronic systolic (congestive) heart failure: Secondary | ICD-10-CM

## 2022-12-11 DIAGNOSIS — I10 Essential (primary) hypertension: Secondary | ICD-10-CM

## 2022-12-11 NOTE — Progress Notes (Signed)
12/11/2022 Name: Blake [name] MRN: [id number] DOB: 01/26/1956  Blake [name] is a 67 y.o. year old male who presented for a telephone visit.   They were referred to the pharmacist by their PCP for assistance in managing complex medication management.   Patient is participating in a Managed Medicaid Plan:  No  Subjective: Telephone visit with patient's sister, Blake [name] (DPR), to discuss ways to assist Blake [name] with adherence to his medications Care Team: Primary Care Provider: Marjie Skiff, NP   Medication Access/Adherence -Patient reports affordability concerns with their medications: No  -Patient reports access/transportation concerns to their pharmacy: Yes  -Patient reports adherence concerns with their medications:  Yes  Sister provides medications to patient, but he does not take them routinely  Medication Management: Current adherence strategy: Sister is caregiver for him and main support, as he is diagnosed with dementia.  She gives his medications to him every day, but he does not always take them.  She reports he may take them twice a week. -Patient reports Poor adherence to medications -Patient reports the following barriers to adherence: forgets to take/does not want to take  Objective: Lab Results  Component Value Date   HGBA1C 5.8 (H) 11/15/2022   Lab Results  Component Value Date   CREATININE 1.02 11/15/2022   BUN 13 11/15/2022   NA 139 11/15/2022   K 4.3 11/15/2022   CL 104 11/15/2022   CO2 26 11/15/2022   Lab Results  Component Value Date   CHOL 118 11/15/2022   HDL 43 11/15/2022   LDLCALC 60 11/15/2022   TRIG 73 11/15/2022   CHOLHDL 3.8 06/08/2019   Medications Reviewed Today     Reviewed by [name], RPH (Pharmacist) on 12/11/22 at 1400  Med List Status: <None>   Medication Order Taking? Sig Documenting Provider Last Dose Status Informant  albuterol (VENTOLIN HFA) 108 (90 Base) MCG/ACT inhaler [id number] Yes Inhale 2 puffs  into the lungs every 6 (six) hours as needed for wheezing or shortness of breath. Ward, Layla Maw, DO Taking Active   aspirin 81 MG chewable tablet [id number] Yes Chew 1 tablet (81 mg total) by mouth daily. Galen Manila, NP Taking Active Multiple Informants, Pharmacy Records, Other  atorvastatin (LIPITOR) 40 MG tablet [id number] Yes Take 1 tablet (40 mg total) by mouth daily. Aura Dials T, NP Taking Active   Cholecalciferol 1.25 MG (50000 UT) TABS [id number] Yes Take 1 tablet by mouth once a week. Aura Dials T, NP Taking Active   cyanocobalamin (VITAMIN B12) 1000 MCG tablet [id number] Yes Take 1 tablet (1,000 mcg total) by mouth daily. Aura Dials T, NP Taking Active   furosemide (LASIX) 20 MG tablet [id number] Yes Take 2 tablets (40 mg total) by mouth daily. Aura Dials T, NP Taking Active   ibuprofen (ADVIL) 600 MG tablet [id number] Yes Take 1 tablet (600 mg total) by mouth every 8 (eight) hours as needed. Phineas Semen, MD Taking Active Multiple Informants, Pharmacy Records, Other  lisinopril (ZESTRIL) 2.5 MG tablet [id number] Yes Take 2.5 mg by mouth daily. [provider] Taking Active Multiple Informants, Pharmacy Records, Other  metoprolol succinate (TOPROL-XL) 25 MG 24 hr tablet [id number] Yes Take 1 tablet (25 mg total) by mouth daily. Aura Dials T, NP Taking Active   nitroGLYCERIN (NITROSTAT) 0.4 MG SL tablet [id number] No Place 1 tablet (0.4 mg total) under the tongue every 5 (five) minutes as needed for chest pain. Reported on 09/29/2015  Patient not taking: Reported on 12/11/2022  Malfi, Jodelle Gross, FNP Not Taking Active Multiple Informants, Pharmacy Records, Other  omeprazole (PRILOSEC) 20 MG capsule [id number] Yes Take 20 mg by mouth daily. [provider] Taking Active Multiple Informants, Pharmacy Records, Other  rivastigmine (EXELON) 3 MG capsule [id number] Yes Take 1 capsule (3 mg total) by mouth 2 (two) times daily. Aura Dials T, NP Taking  Active   umeclidinium-vilanterol (ANORO ELLIPTA) 62.5-25 MCG/ACT AEPB [id number] Yes Inhale 1 puff into the lungs daily at 6 (six) AM. Aura Dials T, NP Taking Active            Assessment/Plan:   Medication Management: - Currently strategy insufficient to maintain appropriate adherence to prescribed medication regimen - Reviewed medication list, and it now currently reflects what/when/how patient is taking active medications - Discussed collaboration with local pharmacies for adherence packaging and mail order.  Patient elects to fill prescriptions with [facility] to receive these services at no additional cost.  Collaborating with pharmacy to get medications transferred/sent over.  Follow Up Plan: None scheduled at this time but can as needed per PCP  [name], PharmD, DPLA

## 2022-12-12 ENCOUNTER — Other Ambulatory Visit: Payer: Self-pay

## 2022-12-12 ENCOUNTER — Other Ambulatory Visit (HOSPITAL_COMMUNITY): Payer: Self-pay

## 2022-12-12 ENCOUNTER — Ambulatory Visit: Payer: Medicare Other | Admitting: Surgery

## 2022-12-12 MED ORDER — ALBUTEROL SULFATE HFA 108 (90 BASE) MCG/ACT IN AERS
2.0000 | INHALATION_SPRAY | Freq: Four times a day (QID) | RESPIRATORY_TRACT | 2 refills | Status: DC | PRN
  Filled 2022-12-12 – 2022-12-13 (×3): qty 18, 25d supply, fill #0
  Filled 2023-03-17: qty 18, 25d supply, fill #1

## 2022-12-12 MED ORDER — VITAMIN D3 1.25 MG (50000 UT) PO CAPS
50000.0000 [IU] | ORAL_CAPSULE | ORAL | 4 refills | Status: DC
  Filled 2022-12-12 – 2022-12-13 (×3): qty 4, 28d supply, fill #0
  Filled 2023-03-17 – 2023-04-03 (×2): qty 4, 28d supply, fill #1

## 2022-12-12 MED ORDER — LISINOPRIL 5 MG PO TABS
5.0000 mg | ORAL_TABLET | Freq: Every day | ORAL | 12 refills | Status: DC
  Filled 2022-12-12: qty 30, 30d supply, fill #0
  Filled 2023-03-17 – 2023-04-03 (×2): qty 30, 30d supply, fill #1

## 2022-12-12 MED ORDER — B-12 1000 MCG PO TABS
1.0000 | ORAL_TABLET | Freq: Every day | ORAL | 4 refills | Status: DC
  Filled 2022-12-12: qty 30, 30d supply, fill #0
  Filled 2023-03-17 – 2023-04-03 (×2): qty 30, 30d supply, fill #1

## 2022-12-12 MED ORDER — ANORO ELLIPTA 62.5-25 MCG/ACT IN AEPB
1.0000 | INHALATION_SPRAY | Freq: Every day | RESPIRATORY_TRACT | 4 refills | Status: DC
  Filled 2022-12-12 – 2022-12-13 (×3): qty 60, 30d supply, fill #0
  Filled 2023-03-17 – 2023-04-03 (×2): qty 60, 30d supply, fill #1

## 2022-12-12 NOTE — Progress Notes (Signed)
   12/12/2022  Patient ID: Blake [name], male   DOB: 1956-03-13, 67 y.o.   MRN: [id number]  [facility] able to transfer refills from [facility] for [name]'s Anoro Ellipta, Albuterol rescue inhaler, lisinopril 5mg , B12 and D3 50,000 units.  Other maintenance medications (asa 81mg , atorvastatin 40mg , furosemide 40mg , metoprolol xl 25mg , omeprazole 20mg , and rivastigmine 3mg  BID) require new prescriptions from provider.  Pending fills to go to [facility] for adherence packaging and mail order for PCP to sign if in agreement.  [name], PharmD, DPLA

## 2022-12-12 NOTE — Addendum Note (Signed)
Addended by: Sabino Niemann A on: 12/12/2022 01:49 PM   Modules accepted: Orders

## 2022-12-13 ENCOUNTER — Other Ambulatory Visit (HOSPITAL_COMMUNITY): Payer: Self-pay

## 2022-12-13 ENCOUNTER — Other Ambulatory Visit: Payer: Self-pay

## 2022-12-13 ENCOUNTER — Other Ambulatory Visit (HOSPITAL_BASED_OUTPATIENT_CLINIC_OR_DEPARTMENT_OTHER): Payer: Self-pay

## 2022-12-13 MED ORDER — ATORVASTATIN CALCIUM 40 MG PO TABS
40.0000 mg | ORAL_TABLET | Freq: Every day | ORAL | 3 refills | Status: DC
  Filled 2022-12-13: qty 30, 30d supply, fill #0
  Filled 2023-03-17 – 2023-04-03 (×2): qty 90, 90d supply, fill #0

## 2022-12-13 MED ORDER — OMEPRAZOLE 20 MG PO CPDR
20.0000 mg | DELAYED_RELEASE_CAPSULE | Freq: Every day | ORAL | 3 refills | Status: DC
  Filled 2022-12-13: qty 30, 30d supply, fill #0
  Filled 2023-03-17 – 2023-04-03 (×2): qty 30, 30d supply, fill #1

## 2022-12-13 MED ORDER — METOPROLOL SUCCINATE ER 25 MG PO TB24
25.0000 mg | ORAL_TABLET | Freq: Every day | ORAL | 3 refills | Status: DC
  Filled 2022-12-13: qty 30, 30d supply, fill #0
  Filled 2023-03-17 – 2023-04-03 (×2): qty 90, 90d supply, fill #0

## 2022-12-13 MED ORDER — RIVASTIGMINE TARTRATE 3 MG PO CAPS
3.0000 mg | ORAL_CAPSULE | Freq: Two times a day (BID) | ORAL | 4 refills | Status: DC
  Filled 2022-12-13 – 2023-03-17 (×2): qty 60, 30d supply, fill #0
  Filled 2023-04-03: qty 180, 90d supply, fill #0

## 2022-12-13 MED ORDER — FUROSEMIDE 40 MG PO TABS
40.0000 mg | ORAL_TABLET | Freq: Every day | ORAL | 3 refills | Status: DC
  Filled 2022-12-13: qty 30, 30d supply, fill #0
  Filled 2023-03-17 – 2023-04-03 (×2): qty 30, 30d supply, fill #1

## 2022-12-13 MED ORDER — ASPIRIN 81 MG PO CHEW
81.0000 mg | CHEWABLE_TABLET | Freq: Every day | ORAL | 11 refills | Status: DC
Start: 2022-12-13 — End: 2023-09-04
  Filled 2022-12-13: qty 30, 30d supply, fill #0
  Filled 2023-03-17 – 2023-04-03 (×2): qty 30, 30d supply, fill #1

## 2022-12-13 NOTE — Addendum Note (Signed)
Addended by: Aura Dials T on: 12/13/2022 08:34 AM   Modules accepted: Orders

## 2022-12-14 ENCOUNTER — Other Ambulatory Visit (HOSPITAL_COMMUNITY): Payer: Self-pay

## 2022-12-14 ENCOUNTER — Other Ambulatory Visit: Payer: Self-pay

## 2022-12-15 ENCOUNTER — Other Ambulatory Visit: Payer: Self-pay

## 2023-01-09 ENCOUNTER — Ambulatory Visit: Payer: Medicare Other | Admitting: Family

## 2023-01-09 ENCOUNTER — Telehealth: Payer: Self-pay | Admitting: Family

## 2023-01-09 NOTE — Telephone Encounter (Signed)
Patient did not show for his initial Heart Failure Clinic appointment on 01/09/23.

## 2023-01-09 NOTE — Progress Notes (Deleted)
PCP: Primary Cardiologist:  HPI:  [name] is a 67 y/o male with a history of  Was in the ED 11/01/22 due to a mass to the right groin area that is been present for "a good time". Evaluated and surgical referral made for hernia. Was in the ED 07/25/22 due to acute on chronic HF.   Echo 06/08/19: EF 35-40% along with severe hypokinesis, mild/mod MR/ AR and mild TR. Echo 12/17/20: EF EF 35% with severe TR, mod/severe MR & mild/moderate AR.   LHC 06/09/19: There is mild to moderate left ventricular systolic dysfunction. LV end diastolic pressure is mildly elevated. The left ventricular ejection fraction is 35-45% by visual estimate. Previously placed Prox Cx to Mid Cx stent (unknown type) is widely patent. Dist LM lesion is 20% stenosed. Prox LAD to Mid LAD lesion is 100% stenosed. LPDA lesion is 40% stenosed. Prox RCA lesion is 100% stenosed. SVG graft was visualized by angiography. The graft exhibits mild diffuse disease. LIMA graft was visualized by angiography and is normal in caliber. The graft exhibits no disease.  1.  Significant underlying three-vessel coronary artery disease with patent LIMA to LAD and SVG to diagonal.  Patent stent in the left circumflex with no significant restenosis.  Known chronically occluded small right coronary artery with bridging and left-to-right collaterals.  Chronically occluded SVG to RCA and SVG to OM.  No significant change in coronary anatomy since 2016. 2.  Mildly to moderately reduced LV systolic function with an EF of 40 to 45% with anterior apical hypokinesis.  Mildly elevated left ventricular end-diastolic pressure at 20 mmHg.  He presents today for a HF f/u visit although hasn't been seen since 10/22. He presents with a chief complaint of    ROS: All systems negative except as listed in HPI, PMH and Problem List.  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   Sexual activity: Not Currently  Other Topics Concern   Not on file  Social History Narrative   Not on file   Social Determinants of Health   Financial Resource Strain: Low Risk  (11/21/2022)   Overall Financial Resource Strain (CARDIA)    Difficulty of Paying Living Expenses: Not very hard  Food Insecurity: No Food Insecurity (11/21/2022)   Hunger Vital Sign    Worried About Running Out of Food in the Last Year: Never true    Ran Out of Food in the Last Year: Never true  Transportation Needs: No Transportation Needs (11/21/2022)   PRAPARE - Administrator, Civil Service (Medical): No    Lack of Transportation (Non-Medical): No  Physical Activity: Inactive (11/21/2022)   Exercise Vital Sign    Days of Exercise per Week: 0 days    Minutes of Exercise per Session:  0 min  Stress: No Stress Concern Present (11/21/2022)   [employer] of Occupational Health - Occupational Stress Questionnaire    Feeling of Stress : Not at all  Social Connections: Socially Isolated (11/21/2022)   Social Connection and Isolation Panel [NHANES]    Frequency of Communication with Friends and Family: More than three times a week    Frequency of Social Gatherings with Friends and Family: Three times a week    Attends Religious Services: Never    Active Member of Clubs or Organizations: No    Attends Banker Meetings: Never    Marital Status: Separated  Intimate Partner Violence: Not At Risk (11/21/2022)   Humiliation,  Afraid, Rape, and Kick questionnaire    Fear of Current or Ex-Partner: No    Emotionally Abused: No    Physically Abused: No    Sexually Abused: No    FH:  Family History  Problem Relation Age of Onset   Hypertension Mother    Hypertension Father    Hypertension Sister    Hypertension Sister     Past Medical History:  Diagnosis Date   Acute headache    CHF (congestive heart failure) (HCC)    COPD (chronic obstructive pulmonary disease) (HCC)    Coronary artery disease    a. cath 2009 w/ severe 3-vessel dz, rec bypass; b. MI 2012-->4-vessel cabg; c. cath 2016: occluded VG-OM and VG-RPDA, patent LIMA-LAD and patent VG-Diag, 95% stenosis of mid LCx s/p PCI/DES 0% residual stenosis   Depression    Hepatitis B    Hepatitis C    HFrEF (heart failure with reduced ejection fraction) (HCC)    a. LV gram 2009 with EF 32% with anterolateral and apical HK   Hyperlipidemia    Hypertension    MI (myocardial infarction) (HCC)    x 2   Tobacco abuse     Current Outpatient Medications  Medication Sig Dispense Refill   albuterol (VENTOLIN HFA) 108 (90 Base) MCG/ACT inhaler Inhale 2 puffs into the lungs every 6 (six) hours as needed for wheeze/shortness of breath. 18 g 2   aspirin 81 MG chewable tablet Chew 1 tablet (81 mg total) by mouth daily. 30 tablet 11   atorvastatin (LIPITOR) 40 MG tablet Take 1 tablet (40 mg total) by mouth daily. 90 tablet 3   Cholecalciferol (VITAMIN D3) 1.25 MG (50000 UT) CAPS Take 1 capsule (50,000 Units total) by mouth every 7 (seven) days. 12 capsule 4   Cyanocobalamin (B-12) 1000 MCG TABS Take 1 tablet (1,000 mcg total) by mouth daily. 90 tablet 4   furosemide (LASIX) 40 MG tablet Take 1 tablet (40 mg total) by mouth daily. 90 tablet 3   ibuprofen (ADVIL) 600 MG tablet Take 1 tablet (600 mg total) by mouth every 8 (eight) hours as needed. 20 tablet 0   lisinopril (ZESTRIL) 5 MG tablet Take 1 tablet (5 mg total) by mouth daily. 30 tablet 12   metoprolol  succinate (TOPROL-XL) 25 MG 24 hr tablet Take 1 tablet (25 mg total) by mouth daily. 90 tablet 3   nitroGLYCERIN (NITROSTAT) 0.4 MG SL tablet Place 1 tablet (0.4 mg total) under the tongue every 5 (five) minutes as needed for chest pain. Reported on 09/29/2015 (Patient not taking: Reported on 12/11/2022) 10 tablet 0   omeprazole (PRILOSEC) 20 MG capsule Take 1 capsule (20 mg total) by mouth daily. 90 capsule 3   rivastigmine (EXELON) 3 MG capsule Take 1 capsule (3 mg total) by mouth  2 (two) times daily. 180 capsule 4   umeclidinium-vilanterol (ANORO ELLIPTA) 62.5-25 MCG/ACT AEPB Inhale 1 puff into the lungs daily at 6 (six) AM. 60 each 4   No current facility-administered medications for this visit.      PHYSICAL EXAM:  General:  Well appearing. No resp difficulty HEENT: normal Neck: supple. JVP flat. Carotids 2+ bilaterally; no bruits. No lymphadenopathy or thryomegaly appreciated. Cor: PMI normal. Regular rate & rhythm. No rubs, gallops or murmurs. Lungs: clear Abdomen: soft, nontender, nondistended. No hepatosplenomegaly. No bruits or masses. Good bowel sounds. Extremities: no cyanosis, clubbing, rash, edema Neuro: alert & orientedx3, cranial nerves grossly intact. Moves all 4 extremities w/o difficulty. Affect pleasant.   ECG:   ASSESSMENT & PLAN:  1: Chronic heart failure with reduced ejection fraction- - suspect due to - NYHA class - euvolemic - weighing daily -   2: HTN- - BP  3: CAD- - CABG

## 2023-01-10 ENCOUNTER — Ambulatory Visit (INDEPENDENT_AMBULATORY_CARE_PROVIDER_SITE_OTHER): Payer: Medicare Other

## 2023-01-10 ENCOUNTER — Telehealth: Payer: Medicare Other

## 2023-01-10 DIAGNOSIS — F0390 Unspecified dementia without behavioral disturbance: Secondary | ICD-10-CM

## 2023-01-10 DIAGNOSIS — I5022 Chronic systolic (congestive) heart failure: Secondary | ICD-10-CM

## 2023-01-10 DIAGNOSIS — J432 Centrilobular emphysema: Secondary | ICD-10-CM

## 2023-01-10 NOTE — Patient Instructions (Signed)
Please call the care guide team at [phone] if you need to cancel or reschedule your appointment.   If you are experiencing a Mental Health or Behavioral Health Crisis or need someone to talk to, please call the Suicide and Crisis Lifeline: 988 call the [place] National Suicide Prevention Lifeline: (938)356-1240 or TTY: 365-558-7610 TTY 715-295-8910) to talk to a trained counselor call 1-800-273-TALK (toll free, 24 hour hotline)   Following is a copy of the CCM Program Consent:  CCM service includes personalized support from designated clinical staff supervised by the physician, including individualized plan of care and coordination with other care providers 24/7 contact phone numbers for assistance for urgent and routine care needs. Service will only be billed when office clinical staff spend 20 minutes or more in a month to coordinate care. Only one practitioner may furnish and bill the service in a calendar month. The patient may stop CCM services at amy time (effective at the end of the month) by phone call to the office staff. The patient will be responsible for cost sharing (co-pay) or up to 20% of the service fee (after annual deductible is met)  Following is a copy of your full provider care plan:   Goals Addressed             This Visit's Progress    CCM Expected Outcome:  Monitor, Self-Manage and Reduce Symptoms of Heart Failure       Current Barriers:  Knowledge Deficits related to the importance of medication compliance and taking medications as directed for effective management of HF Chronic Disease Management support and education needs related to effective management of HF  Planned Interventions: Basic overview and discussion of pathophysiology of Heart Failure reviewed Provided education on low sodium diet. The patient is doing well with his food intake and his sister monitors his intake.  Assessed need for readable accurate scales in home Provided education about  placing scale on hard, flat surface Advised patient to weigh each morning after emptying bladder Discussed importance of daily weight and advised patient to weigh and record daily Reviewed role of diuretics in prevention of fluid overload and management of heart failure. The patients sister states that the patient is out of his Lasix. Review of order and informed the patient sister the patient has refills and she can call and get a refill ordered  Discussed the importance of keeping all appointments with provider. The patient saw the pcp recently and advised the patients sister to call the office and get a follow up appointment with the pcp. Provided patient with education about the role of exercise in the management of heart failure Advised patient to discuss changes in fluid balance, swelling or edema and changes in heart health with provider Screening for signs and symptoms of depression related to chronic disease state  Assessed social determinant of health barriers  Symptom Management: Take medications as prescribed   Attend all scheduled provider appointments Call provider office for new concerns or questions  call the Suicide and Crisis Lifeline: 988 call the [place] National Suicide Prevention Lifeline: 701-817-4422 or TTY: (585)746-4279 TTY 585-802-9195) to talk to a trained counselor call 1-800-273-TALK (toll free, 24 hour hotline) if experiencing a Mental Health or Behavioral Health Crisis  call office if I gain more than 2 pounds in one day or 5 pounds in one week keep legs up while sitting use salt in moderation watch for swelling in feet, ankles and legs every day weigh myself daily develop a rescue plan  follow rescue plan if symptoms flare-up track symptoms and what helps feel better or worse dress right for the weather, hot or cold  Follow Up Plan: Telephone follow up appointment with care management team member scheduled for: 03-14-2023 at 230 pm       CCM Expected  Outcome:  Monitor, Self-Manage and Reduce Symptoms of: Dementia       Current Barriers:  Knowledge Deficits related to resources in the area for ongoing support and education for effective management of dementia and memory changes Care Coordination needs related to referral for pharm D and help with medication compliance  in a patient with dementia  Chronic Disease Management support and education needs related to effective management of dementia  Planned Interventions: Evaluation of current treatment plan related to dementia and changes in memory  and patient's adherence to plan as established by provider Advised patient to call the office for changes in mood, anxiety, depression, and mental health concerns. The patients sister states he is stable at this time. He is taking his medications as directed and it helped to have the pcp talk to him about this. Education provided on getting a follow up with the pcp for regular visits. The patients sister will call and get th e patient a follow up appointment.  Provided education to patient re: resources to help with activities and socialization for the patient. The patient likes to sit and watch a lot of TV. The patient may benefit from going to the [facility]: [facility] [employer]. [address] [place], [place] [address], [phone]. Also his sister may call [facility] at [phone] for additional resources in [facility] Reviewed medications with patient and discussed compliance. The patients sister states that since the provider explained the reason he needed to take his medications he has been compliant with his medications and taking as directed. Has an appointment with the pharm D tomorrow at 3 pm for initial outreach.  Provided patient with local resources for [facility] and Elder care educational materials related to resources to help with socialization and increasing activity Reviewed scheduled/upcoming provider  appointments including: Encouraged the patients sister to call and get a follow up appointment with the pcp  Pharmacy referral for assistance with medication compliance, education and support. Works with the pharm D on a regular basis.  Discussed plans with patient for ongoing care management follow up and provided patient with direct contact information for care management team Advised patient to discuss changes in mood, anxiety, depression, or memory  with provider Screening for signs and symptoms of depression related to chronic disease state  Assessed social determinant of health barriers  Symptom Management: Take medications as prescribed   Attend all scheduled provider appointments Call provider office for new concerns or questions  call the Suicide and Crisis Lifeline: 988 call the [place] National Suicide Prevention Lifeline: 570-066-7869 or TTY: 586-513-8186 TTY 5158393335) to talk to a trained counselor call 1-800-273-TALK (toll free, 24 hour hotline) if experiencing a Mental Health or Behavioral Health Crisis   Follow Up Plan: Telephone follow up appointment with care management team member scheduled for: 03-14-2023 at 230 pm       CCM:  Maintain, Monitor and Self-Manage Symptoms of COPD       Current Barriers:  Chronic Disease Management support and education needs related to effective management of COPD  Planned Interventions: Provided patient with basic written and verbal COPD education on self care/management/and exacerbation prevention. The patients sister states that the  patient has not been going outside and staying in on hot days. She monitors for changes in his breathing.  Advised patient to track and manage COPD triggers. Review of triggers and to monitor for changes and factors that can cause exacerbations Provided written and verbal instructions on pursed lip breathing and utilized returned demonstration as teach back Provided instruction about proper use of  medications used for management of COPD including inhalers Advised patient to self assesses COPD action plan zone and make appointment with provider if in the yellow zone for 48 hours without improvement Advised patient to engage in light exercise as tolerated 3-5 days a week to aid in the the management of COPD Provided education about and advised patient to utilize infection prevention strategies to reduce risk of respiratory infection Discussed the importance of adequate rest and management of fatigue with COPD Screening for signs and symptoms of depression related to chronic disease state  Assessed social determinant of health barriers  Symptom Management: Take medications as prescribed   Attend all scheduled provider appointments Call provider office for new concerns or questions  call the Suicide and Crisis Lifeline: 988 call the [place] National Suicide Prevention Lifeline: 630-493-5334 or TTY: 534-544-5099 TTY 262-174-2204) to talk to a trained counselor call 1-800-273-TALK (toll free, 24 hour hotline) if experiencing a Mental Health or Behavioral Health Crisis  avoid second hand smoke eliminate smoking in my home identify and remove indoor air pollutants limit outdoor activity during cold weather listen for public air quality announcements every day develop a rescue plan eliminate symptom triggers at home follow rescue plan if symptoms flare-up  Follow Up Plan: Telephone follow up appointment with care management team member scheduled for: 03-14-2023 at 230 pm          The patient verbalized understanding of instructions, educational materials, and care plan provided today and DECLINED offer to receive copy of patient instructions, educational materials, and care plan.  Telephone follow up appointment with care management team member scheduled for: 03-14-2023 at 230 pm

## 2023-01-10 NOTE — Chronic Care Management (AMB) (Signed)
Chronic Care Management   CCM RN Visit Note  01/10/2023 Name: Blake [name] MRN: [id number] DOB: 1956/07/09  Subjective: Blake [name] is a 67 y.o. year old male who is a primary care patient of Cannady, Dorie Rank, NP. The patient was referred to the Chronic Care Management team for assistance with care management needs subsequent to provider initiation of CCM services and plan of care.    Today's Visit:  Engaged with patient by telephone for follow up visit.        Goals Addressed             This Visit's Progress    CCM Expected Outcome:  Monitor, Self-Manage and Reduce Symptoms of Heart Failure       Current Barriers:  Knowledge Deficits related to the importance of medication compliance and taking medications as directed for effective management of HF Chronic Disease Management support and education needs related to effective management of HF  Planned Interventions: Basic overview and discussion of pathophysiology of Heart Failure reviewed Provided education on low sodium diet. The patient is doing well with his food intake and his sister monitors his intake.  Assessed need for readable accurate scales in home Provided education about placing scale on hard, flat surface Advised patient to weigh each morning after emptying bladder Discussed importance of daily weight and advised patient to weigh and record daily Reviewed role of diuretics in prevention of fluid overload and management of heart failure. The patients sister states that the patient is out of his Lasix. Review of order and informed the patient sister the patient has refills and she can call and get a refill ordered  Discussed the importance of keeping all appointments with provider. The patient saw the pcp recently and advised the patients sister to call the office and get a follow up appointment with the pcp. Provided patient with education about the role of exercise in the management of heart  failure Advised patient to discuss changes in fluid balance, swelling or edema and changes in heart health with provider Screening for signs and symptoms of depression related to chronic disease state  Assessed social determinant of health barriers  Symptom Management: Take medications as prescribed   Attend all scheduled provider appointments Call provider office for new concerns or questions  call the Suicide and Crisis Lifeline: 988 call the [place] National Suicide Prevention Lifeline: 437 666 4567 or TTY: 657-430-6099 TTY (725)460-6450) to talk to a trained counselor call 1-800-273-TALK (toll free, 24 hour hotline) if experiencing a Mental Health or Behavioral Health Crisis  call office if I gain more than 2 pounds in one day or 5 pounds in one week keep legs up while sitting use salt in moderation watch for swelling in feet, ankles and legs every day weigh myself daily develop a rescue plan follow rescue plan if symptoms flare-up track symptoms and what helps feel better or worse dress right for the weather, hot or cold  Follow Up Plan: Telephone follow up appointment with care management team member scheduled for: 03-14-2023 at 230 pm       CCM Expected Outcome:  Monitor, Self-Manage and Reduce Symptoms of: Dementia       Current Barriers:  Knowledge Deficits related to resources in the area for ongoing support and education for effective management of dementia and memory changes Care Coordination needs related to referral for pharm D and help with medication compliance  in a patient with dementia  Chronic Disease Management support and education needs related to effective  management of dementia  Planned Interventions: Evaluation of current treatment plan related to dementia and changes in memory  and patient's adherence to plan as established by provider Advised patient to call the office for changes in mood, anxiety, depression, and mental health concerns. The patients sister  states he is stable at this time. He is taking his medications as directed and it helped to have the pcp talk to him about this. Education provided on getting a follow up with the pcp for regular visits. The patients sister will call and get th e patient a follow up appointment.  Provided education to patient re: resources to help with activities and socialization for the patient. The patient likes to sit and watch a lot of TV. The patient may benefit from going to the Senior Center: [facility] [employer]. [address], [place], [place] [address], [phone]. Also his sister may call [facility] at [phone] for additional resources in [facility] Reviewed medications with patient and discussed compliance. The patients sister states that since the provider explained the reason he needed to take his medications he has been compliant with his medications and taking as directed. Has an appointment with the pharm D tomorrow at 3 pm for initial outreach.  Provided patient with local resources for [facility] and Elder care educational materials related to resources to help with socialization and increasing activity Reviewed scheduled/upcoming provider appointments including: Encouraged the patients sister to call and get a follow up appointment with the pcp  Pharmacy referral for assistance with medication compliance, education and support. Works with the pharm D on a regular basis.  Discussed plans with patient for ongoing care management follow up and provided patient with direct contact information for care management team Advised patient to discuss changes in mood, anxiety, depression, or memory  with provider Screening for signs and symptoms of depression related to chronic disease state  Assessed social determinant of health barriers  Symptom Management: Take medications as prescribed   Attend all scheduled provider appointments Call provider office for new concerns or  questions  call the Suicide and Crisis Lifeline: 988 call the [place] National Suicide Prevention Lifeline: 352-531-1089 or TTY: 475 122 8392 TTY (705)521-9554) to talk to a trained counselor call 1-800-273-TALK (toll free, 24 hour hotline) if experiencing a Mental Health or Behavioral Health Crisis   Follow Up Plan: Telephone follow up appointment with care management team member scheduled for: 03-14-2023 at 230 pm       CCM:  Maintain, Monitor and Self-Manage Symptoms of COPD       Current Barriers:  Chronic Disease Management support and education needs related to effective management of COPD  Planned Interventions: Provided patient with basic written and verbal COPD education on self care/management/and exacerbation prevention. The patients sister states that the patient has not been going outside and staying in on hot days. She monitors for changes in his breathing.  Advised patient to track and manage COPD triggers. Review of triggers and to monitor for changes and factors that can cause exacerbations Provided written and verbal instructions on pursed lip breathing and utilized returned demonstration as teach back Provided instruction about proper use of medications used for management of COPD including inhalers Advised patient to self assesses COPD action plan zone and make appointment with provider if in the yellow zone for 48 hours without improvement Advised patient to engage in light exercise as tolerated 3-5 days a week to aid in the the management of COPD Provided education about  and advised patient to utilize infection prevention strategies to reduce risk of respiratory infection Discussed the importance of adequate rest and management of fatigue with COPD Screening for signs and symptoms of depression related to chronic disease state  Assessed social determinant of health barriers  Symptom Management: Take medications as prescribed   Attend all scheduled provider  appointments Call provider office for new concerns or questions  call the Suicide and Crisis Lifeline: 988 call the [place] National Suicide Prevention Lifeline: 312-032-6254 or TTY: 708-239-6102 TTY 978 578 9081) to talk to a trained counselor call 1-800-273-TALK (toll free, 24 hour hotline) if experiencing a Mental Health or Behavioral Health Crisis  avoid second hand smoke eliminate smoking in my home identify and remove indoor air pollutants limit outdoor activity during cold weather listen for public air quality announcements every day develop a rescue plan eliminate symptom triggers at home follow rescue plan if symptoms flare-up  Follow Up Plan: Telephone follow up appointment with care management team member scheduled for: 03-14-2023 at 230 pm          Plan:Telephone follow up appointment with care management team member scheduled for:  03-14-2023 at 230 pm  Alto Denver RN, MSN, CCM RN Care Manager  Chronic Care Management Direct Number: [phone]

## 2023-01-22 ENCOUNTER — Telehealth: Payer: Self-pay | Admitting: Nurse Practitioner

## 2023-01-22 NOTE — Telephone Encounter (Signed)
Lupita Leash (Sister)  [phone]  Is calling to report she dropped of paperwork for [facility] about 2 months ago. [facility] called to report that they never received the paperwork. Please advise

## 2023-01-24 ENCOUNTER — Encounter: Payer: Medicare Other | Admitting: Family

## 2023-01-24 ENCOUNTER — Telehealth: Payer: Self-pay | Admitting: Family

## 2023-01-24 NOTE — Telephone Encounter (Signed)
Patient did not show for his initial Heart Failure Clinic appointment on 01/24/23.

## 2023-01-26 NOTE — Telephone Encounter (Signed)
Have tried to call patient several times about said [facility] paperwork, have not seen in office. Could not reach patient or sister

## 2023-01-28 DIAGNOSIS — E559 Vitamin D deficiency, unspecified: Secondary | ICD-10-CM | POA: Insufficient documentation

## 2023-02-02 ENCOUNTER — Ambulatory Visit: Payer: Medicare Other | Admitting: Nurse Practitioner

## 2023-02-02 DIAGNOSIS — F0390 Unspecified dementia without behavioral disturbance: Secondary | ICD-10-CM

## 2023-02-02 DIAGNOSIS — I10 Essential (primary) hypertension: Secondary | ICD-10-CM

## 2023-02-02 DIAGNOSIS — I5022 Chronic systolic (congestive) heart failure: Secondary | ICD-10-CM

## 2023-02-02 DIAGNOSIS — E7800 Pure hypercholesterolemia, unspecified: Secondary | ICD-10-CM

## 2023-02-02 DIAGNOSIS — R7301 Impaired fasting glucose: Secondary | ICD-10-CM

## 2023-02-02 DIAGNOSIS — E559 Vitamin D deficiency, unspecified: Secondary | ICD-10-CM

## 2023-02-02 DIAGNOSIS — I25118 Atherosclerotic heart disease of native coronary artery with other forms of angina pectoris: Secondary | ICD-10-CM

## 2023-02-02 DIAGNOSIS — J432 Centrilobular emphysema: Secondary | ICD-10-CM

## 2023-02-02 DIAGNOSIS — F3341 Major depressive disorder, recurrent, in partial remission: Secondary | ICD-10-CM

## 2023-02-07 DIAGNOSIS — F0390 Unspecified dementia without behavioral disturbance: Secondary | ICD-10-CM

## 2023-02-07 DIAGNOSIS — I5020 Unspecified systolic (congestive) heart failure: Secondary | ICD-10-CM

## 2023-02-07 DIAGNOSIS — J449 Chronic obstructive pulmonary disease, unspecified: Secondary | ICD-10-CM | POA: Diagnosis not present

## 2023-02-07 DIAGNOSIS — F17210 Nicotine dependence, cigarettes, uncomplicated: Secondary | ICD-10-CM | POA: Diagnosis not present

## 2023-02-10 ENCOUNTER — Other Ambulatory Visit: Payer: Self-pay | Admitting: Nurse Practitioner

## 2023-02-12 NOTE — Telephone Encounter (Signed)
Requested Prescriptions  Refused Prescriptions Disp Refills   atorvastatin (LIPITOR) 40 MG tablet [Pharmacy Med Name: ATORVASTATIN 40 MG TABLET] 90 tablet 3    Sig: TAKE 1 TABLET BY MOUTH EVERY DAY     Cardiovascular:  Antilipid - Statins Failed - 02/10/2023  1:13 AM      Failed - Lipid Panel in normal range within the last 12 months    Cholesterol, Total  Date Value Ref Range Status  11/15/2022 118 100 - 199 mg/dL Final   LDL Chol Calc (NIH)  Date Value Ref Range Status  11/15/2022 60 0 - 99 mg/dL Final   HDL  Date Value Ref Range Status  11/15/2022 43 >39 mg/dL Final   Triglycerides  Date Value Ref Range Status  11/15/2022 73 0 - 149 mg/dL Final         Passed - Patient is not pregnant      Passed - Valid encounter within last 12 months    Recent Outpatient Visits           2 months ago Chronic systolic CHF (congestive heart failure) (HCC)   [place] [facility] [place], Corrie Dandy T, NP   1 year ago Chronic pain of right knee   [place] [facility] Vigg, Avanti, MD   3 years ago Hypercholesteremia   [place] [facility] [place], Jodelle Gross, FNP   4 years ago Centrilobular emphysema [facility])   [facility] Ky[place] Rudd, Alison Stalling, NP   4 years ago Seasonal allergies   [place] [facility] Kyung Rudd, Alison Stalling, NP               metoprolol succinate (TOPROL-XL) 25 MG 24 hr tablet [Pharmacy Med Name: METOPROLOL SUCC ER 25 MG TAB] 90 tablet 3    Sig: TAKE 1 TABLET (25 MG TOTAL) BY MOUTH DAILY.     Cardiovascular:  Beta Blockers Passed - 02/10/2023  1:13 AM      Passed - Last BP in normal range    BP Readings from Last 1 Encounters:  11/15/22 138/74         Passed - Last Heart Rate in normal range    Pulse Readings from Last 1 Encounters:  11/15/22 72         Passed - Valid encounter within last 6 months    Recent Outpatient Visits           2 months ago Chronic systolic  CHF (congestive heart failure) (HCC)   [place] [facility], Corrie Dandy T, NP   1 year ago Chronic pain of right knee   [place] [facility] Vigg, Avanti, MD   3 years ago Hypercholesteremia   [facility] [place], Jodelle Gross, FNP   4 years ago Centrilobular emphysema [facility])   [place] [facility] Kyung Rudd, Alison Stalling, NP   4 years ago Seasonal allergies   [facility] Kyung Rudd, Alison Stalling, NP

## 2023-02-15 ENCOUNTER — Telehealth: Payer: Self-pay | Admitting: Nurse Practitioner

## 2023-02-15 NOTE — Telephone Encounter (Signed)
Copied from CRM [id number]. Topic: Medicare AWV >> Feb 15, 2023 10:55 AM Payton Doughty wrote: Reason for CRM: Called 02/15/2023 to sched AWV - MAILBOX FULL  Verlee Rossetti; Care Guide Ambulatory Clinical Support [place] [facility] Direct Dial: [phone]

## 2023-02-20 ENCOUNTER — Ambulatory Visit: Payer: Medicare Other | Admitting: Gastroenterology

## 2023-02-20 ENCOUNTER — Telehealth: Payer: Self-pay

## 2023-02-20 NOTE — Telephone Encounter (Signed)
Dr. Tobi Bastos reviewed patient's chart and stated that there were no labs suggesting that the patient has Hepatitis C. Therefore, he asked for me to get in contact with his PCP Aura Dials, NP) and ask for labs. She stated that she is a new patient of hers and that he use to see Dr. Charlotta Newton and she was looking from notes that the patient had Hepatitis C and that he had received some treatment but then stopped. Therefore, Mrs. Harvest Dark referred him to [place]. However, Dr. Tobi Bastos wants the patient to have labs first stating that he has Hep C first and then he could be seen by [place]. Mrsd. Cannady, NP and patient understood.

## 2023-03-14 ENCOUNTER — Other Ambulatory Visit: Payer: Medicare Other

## 2023-03-14 ENCOUNTER — Telehealth: Payer: Self-pay

## 2023-03-14 ENCOUNTER — Telehealth: Payer: Medicare Other

## 2023-03-14 ENCOUNTER — Other Ambulatory Visit: Payer: Self-pay

## 2023-03-14 NOTE — Patient Instructions (Signed)
Visit Information  Thank you for taking time to visit with me today. Please don't hesitate to contact me if I can be of assistance to you before our next scheduled telephone appointment.  Following are the goals we discussed today:   Goals Addressed             This Visit's Progress    RNCM Care Management  Expected Outcome:  Monitor, Self-Manage and Reduce Symptoms of Heart Failure       Current Barriers:  Knowledge Deficits related to the importance of medication compliance and taking medications as directed for effective management of HF Chronic Disease Management support and education needs related to effective management of HF Wt Readings from Last 3 Encounters:  11/15/22 159 lb 12.8 oz (72.5 kg)  11/07/22 158 lb (71.7 kg)  11/01/22 160 lb (72.6 kg)     Planned Interventions: Basic overview and discussion of pathophysiology of Heart Failure reviewed. The patients sister states that he missed his cardiologist appointment because she had appointments for her upcoming surgery tomorrow and she is going to get it rescheduled. Education given.  Provided education on low sodium diet. The patient is doing well with his food intake and his sister monitors his intake.  Assessed need for readable accurate scales in home Provided education about placing scale on hard, flat surface Advised patient to weigh each morning after emptying bladder Discussed importance of daily weight and advised patient to weigh and record daily Reviewed role of diuretics in prevention of fluid overload and management of heart failure. The patient is taking his medications as directed. His sister assist with helping him to be compliant Discussed the importance of keeping all appointments with provider. The patients sister is going to call and get his cardiologist appointment rescheduled. She states that he missed it due to her own issues going on. Education and support given.  Provided patient with education about  the role of exercise in the management of heart failure Advised patient to discuss changes in fluid balance, swelling or edema and changes in heart health with provider Screening for signs and symptoms of depression related to chronic disease state  Assessed social determinant of health barriers  Symptom Management: Take medications as prescribed   Attend all scheduled provider appointments Call provider office for new concerns or questions  call the Suicide and Crisis Lifeline: 988 call the [place] National Suicide Prevention Lifeline: (587)514-6562 or TTY: 985-110-3347 TTY (819) 357-3798) to talk to a trained counselor call 1-800-273-TALK (toll free, 24 hour hotline) if experiencing a Mental Health or Behavioral Health Crisis  call office if I gain more than 2 pounds in one day or 5 pounds in one week keep legs up while sitting use salt in moderation watch for swelling in feet, ankles and legs every day weigh myself daily develop a rescue plan follow rescue plan if symptoms flare-up track symptoms and what helps feel better or worse dress right for the weather, hot or cold  Follow Up Plan: Telephone follow up appointment with care management team member scheduled for: 05-16-2023 at 230 pm       RNCM Care Management Expected Outcome:  Monitor, Self-Manage and Reduce Symptoms of: Dementia       Current Barriers:  Knowledge Deficits related to resources in the area for ongoing support and education for effective management of dementia and memory changes Care Coordination needs related to referral for pharm D and help with medication compliance  in a patient with dementia  Chronic Disease  Management support and education needs related to effective management of dementia  Planned Interventions: Evaluation of current treatment plan related to dementia and changes in memory  and patient's adherence to plan as established by provider. The patients sister states she feels the patient is doing  well. She does want him to go back and see the neurologist for follow up. She feels he has his days and nights mixed up. The patient sleeps during the day and doesn't sleep a lot at night. Education on things to try to help with better sleep patterns. His sister is having surgery tomorrow so she states after that she will get his appointments scheduled.  Advised patient to call the office for changes in mood, anxiety, depression, and mental health concerns. The patients sister states he is stable at this time. He is taking his medications as directed and it helped to have the pcp talk to him about this. Education provided on getting a follow up with the pcp for regular visits. The patients sister will call and get the patient a follow up appointment.  Provided education to patient re: resources to help with activities and socialization for the patient. The patient likes to sit and watch a lot of TV. The patient may benefit from going to the Senior Center: [facility] at [employer]. [address], [place], [place] [address], [phone]. Also his sister may call [facility] at [phone] for additional resources in [facility] Reviewed medications with patient and discussed compliance. The patients sister states that since the provider explained the reason he needed to take his medications he has been compliant with his medications and taking as directed. Has an appointment with the pharm D tomorrow at 3 pm for initial outreach.  Provided patient with local resources for [facility] and Elder care educational materials related to resources to help with socialization and increasing activity Reviewed scheduled/upcoming provider appointments including: Encouraged the patients sister to call and get a follow up appointment with the pcp, cardiologist, and neurologist for follow up Pharmacy referral for assistance with medication compliance, education and support. Works with the pharm D on a  regular basis.  Discussed plans with patient for ongoing care management follow up and provided patient with direct contact information for care management team Advised patient to discuss changes in mood, anxiety, depression, or memory  with provider Screening for signs and symptoms of depression related to chronic disease state  Assessed social determinant of health barriers  Symptom Management: Take medications as prescribed   Attend all scheduled provider appointments Call provider office for new concerns or questions  call the Suicide and Crisis Lifeline: 988 call the [place] National Suicide Prevention Lifeline: 520-633-6254 or TTY: (970)346-0848 TTY 832 843 2821) to talk to a trained counselor call 1-800-273-TALK (toll free, 24 hour hotline) if experiencing a Mental Health or Behavioral Health Crisis   Follow Up Plan: Telephone follow up appointment with care management team member scheduled for: 05-16-2023 at 230 pm       RNCM Care Management:  Maintain, Monitor and Self-Manage Symptoms of COPD       Current Barriers:  Chronic Disease Management support and education needs related to effective management of COPD  Planned Interventions: Provided patient with basic written and verbal COPD education on self care/management/and exacerbation prevention. The patients sister states that the patient has not been going outside and staying in on hot days. She monitors for changes in his breathing. Denies any acute changes in his breathing. States that his  sleeping patterns have been different and he is staying awake at night and sleeping during the day. Wants him to see the neurologist again. Education provided.  Advised patient to track and manage COPD triggers. Review of triggers and to monitor for changes and factors that can cause exacerbations Provided written and verbal instructions on pursed lip breathing and utilized returned demonstration as teach back Provided instruction about proper  use of medications used for management of COPD including inhalers Advised patient to self assesses COPD action plan zone and make appointment with provider if in the yellow zone for 48 hours without improvement Advised patient to engage in light exercise as tolerated 3-5 days a week to aid in the the management of COPD Provided education about and advised patient to utilize infection prevention strategies to reduce risk of respiratory infection Discussed the importance of adequate rest and management of fatigue with COPD Screening for signs and symptoms of depression related to chronic disease state  Assessed social determinant of health barriers  Symptom Management: Take medications as prescribed   Attend all scheduled provider appointments Call provider office for new concerns or questions  call the Suicide and Crisis Lifeline: 988 call the [place] National Suicide Prevention Lifeline: (757)266-9474 or TTY: 937-283-9256 TTY 450-615-6121) to talk to a trained counselor call 1-800-273-TALK (toll free, 24 hour hotline) if experiencing a Mental Health or Behavioral Health Crisis  avoid second hand smoke eliminate smoking in my home identify and remove indoor air pollutants limit outdoor activity during cold weather listen for public air quality announcements every day develop a rescue plan eliminate symptom triggers at home follow rescue plan if symptoms flare-up  Follow Up Plan: Telephone follow up appointment with care management team member scheduled for: 05-16-2023 at 230 pm           Our next appointment is by telephone on 05-16-2023  at 230 pm  Please call the care guide team at [phone] if you need to cancel or reschedule your appointment.   If you are experiencing a Mental Health or Behavioral Health Crisis or need someone to talk to, please call the Suicide and Crisis Lifeline: 988 call the [place] National Suicide Prevention Lifeline: (917)318-0477 or TTY: (431)227-5875 TTY  228 276 2685) to talk to a trained counselor call 1-800-273-TALK (toll free, 24 hour hotline)   The patient verbalized understanding of instructions, educational materials, and care plan provided today and DECLINED offer to receive copy of patient instructions, educational materials, and care plan.     Alto Denver RN, MSN, CCM RN Care Manager  [facility]  Ambulatory Care Management  Direct Number: [phone]

## 2023-03-14 NOTE — Patient Outreach (Signed)
  Care Management   Follow Up Note   03/14/2023 Name: Blake [name] MRN: [id number] DOB: [birth year]   Referred by: Marjie Skiff, NP Reason for referral : Care Management (RNCM: Follow up for Chronic Disease Management and Care Coordination Needs- attempt)   An unsuccessful telephone outreach was attempted today. The patient was referred to the case management team for assistance with care management and care coordination.   Follow Up Plan: The care management team will reach out to the patient again over the next 30 days.   Alto Denver RN, MSN, CCM RN Care Manager  [facility]  Ambulatory Care Management  Direct Number: [phone]

## 2023-03-14 NOTE — Patient Outreach (Signed)
Care Management   Visit Note  03/14/2023 Name: Blake [name] MRN: [id number] DOB: 10-Mar-1956  Subjective: Blake [name] is a 67 y.o. year old male who is a primary care patient of Cannady, Blake Rank, Blake [name]. The Care Management team was consulted for assistance.      Engaged with patient spoke with the family member (POA, Blake [name], [place]).    Goals Addressed             This Visit's Progress    RNCM Care Management  Expected Outcome:  Monitor, Self-Manage and Reduce Symptoms of Heart Failure       Current Barriers:  Knowledge Deficits related to the importance of medication compliance and taking medications as directed for effective management of HF Chronic Disease Management support and education needs related to effective management of HF Wt Readings from Last 3 Encounters:  11/15/22 159 lb 12.8 oz (72.5 kg)  11/07/22 158 lb (71.7 kg)  11/01/22 160 lb (72.6 kg)     Planned Interventions: Basic overview and discussion of pathophysiology of Heart Failure reviewed. The patients sister states that he missed his cardiologist appointment because she had appointments for her upcoming surgery tomorrow and she is going to get it rescheduled. Education given.  Provided education on low sodium diet. The patient is doing well with his food intake and his sister monitors his intake.  Assessed need for readable accurate scales in home Provided education about placing scale on hard, flat surface Advised patient to weigh each morning after emptying bladder Discussed importance of daily weight and advised patient to weigh and record daily Reviewed role of diuretics in prevention of fluid overload and management of heart failure. The patient is taking his medications as directed. His sister assist with helping him to be compliant Discussed the importance of keeping all appointments with provider. The patients sister is going to call and get his cardiologist appointment rescheduled. She states  that he missed it due to her own issues going on. Education and support given.  Provided patient with education about the role of exercise in the management of heart failure Advised patient to discuss changes in fluid balance, swelling or edema and changes in heart health with provider Screening for signs and symptoms of depression related to chronic disease state  Assessed social determinant of health barriers  Symptom Management: Take medications as prescribed   Attend all scheduled provider appointments Call provider office for new concerns or questions  call the Suicide and Crisis Lifeline: 988 call the [place] National Suicide Prevention Lifeline: (971)409-2517 or TTY: (845)842-9287 TTY (901)062-7077) to talk to a trained counselor call 1-800-273-TALK (toll free, 24 hour hotline) if experiencing a Mental Health or Behavioral Health Crisis  call office if I gain more than 2 pounds in one day or 5 pounds in one week keep legs up while sitting use salt in moderation watch for swelling in feet, ankles and legs every day weigh myself daily develop a rescue plan follow rescue plan if symptoms flare-up track symptoms and what helps feel better or worse dress right for the weather, hot or cold  Follow Up Plan: Telephone follow up appointment with care management team member scheduled for: 05-16-2023 at 230 pm       RNCM Care Management Expected Outcome:  Monitor, Self-Manage and Reduce Symptoms of: Dementia       Current Barriers:  Knowledge Deficits related to resources in the area for ongoing support and education for effective management of dementia and memory changes Care Coordination  needs related to referral for pharm D and help with medication compliance  in a patient with dementia  Chronic Disease Management support and education needs related to effective management of dementia  Planned Interventions: Evaluation of current treatment plan related to dementia and changes in memory   and patient's adherence to plan as established by provider. The patients sister states she feels the patient is doing well. She does want him to go back and see the neurologist for follow up. She feels he has his days and nights mixed up. The patient sleeps during the day and doesn't sleep a lot at night. Education on things to try to help with better sleep patterns. His sister is having surgery tomorrow so she states after that she will get his appointments scheduled.  Advised patient to call the office for changes in mood, anxiety, depression, and mental health concerns. The patients sister states he is stable at this time. He is taking his medications as directed and it helped to have the pcp talk to him about this. Education provided on getting a follow up with the pcp for regular visits. The patients sister will call and get the patient a follow up appointment.  Provided education to patient re: resources to help with activities and socialization for the patient. The patient likes to sit and watch a lot of TV. The patient may benefit from going to the [facility]: [facility] [employer]. [address], [place], [place] [address], [phone]. Also his sister may call [facility] at [phone] for additional resources in [facility] Reviewed medications with patient and discussed compliance. The patients sister states that since the provider explained the reason he needed to take his medications he has been compliant with his medications and taking as directed. Has an appointment with the pharm D tomorrow at 3 pm for initial outreach.  Provided patient with local resources for [facility] Elder [facility] educational materials related to resources to help with socialization and increasing activity Reviewed scheduled/upcoming provider appointments including: Encouraged the patients sister to call and get a follow up appointment with the pcp, cardiologist, and neurologist for  follow up Pharmacy referral for assistance with medication compliance, education and support. Works with the pharm D on a regular basis.  Discussed plans with patient for ongoing care management follow up and provided patient with direct contact information for care management team Advised patient to discuss changes in mood, anxiety, depression, or memory  with provider Screening for signs and symptoms of depression related to chronic disease state  Assessed social determinant of health barriers  Symptom Management: Take medications as prescribed   Attend all scheduled provider appointments Call provider office for new concerns or questions  call the Suicide and Crisis Lifeline: 988 call the [place] National Suicide Prevention Lifeline: (765)040-1880 or TTY: 432 863 4764 TTY (737) 046-3252) to talk to a trained counselor call 1-800-273-TALK (toll free, 24 hour hotline) if experiencing a Mental Health or Behavioral Health Crisis   Follow Up Plan: Telephone follow up appointment with care management team member scheduled for: 05-16-2023 at 230 pm       RNCM Care Management:  Maintain, Monitor and Self-Manage Symptoms of COPD       Current Barriers:  Chronic Disease Management support and education needs related to effective management of COPD  Planned Interventions: Provided patient with basic written and verbal COPD education on self care/management/and exacerbation prevention. The patients sister states that the patient has not been going outside and staying  in on hot days. She monitors for changes in his breathing. Denies any acute changes in his breathing. States that his sleeping patterns have been different and he is staying awake at night and sleeping during the day. Wants him to see the neurologist again. Education provided.  Advised patient to track and manage COPD triggers. Review of triggers and to monitor for changes and factors that can cause exacerbations Provided written and verbal  instructions on pursed lip breathing and utilized returned demonstration as teach back Provided instruction about proper use of medications used for management of COPD including inhalers Advised patient to self assesses COPD action plan zone and make appointment with provider if in the yellow zone for 48 hours without improvement Advised patient to engage in light exercise as tolerated 3-5 days a week to aid in the the management of COPD Provided education about and advised patient to utilize infection prevention strategies to reduce risk of respiratory infection Discussed the importance of adequate rest and management of fatigue with COPD Screening for signs and symptoms of depression related to chronic disease state  Assessed social determinant of health barriers  Symptom Management: Take medications as prescribed   Attend all scheduled provider appointments Call provider office for new concerns or questions  call the Suicide and Crisis Lifeline: 988 call the [place] National Suicide Prevention Lifeline: 306-023-5123 or TTY: 6041523701 TTY 626 519 4193) to talk to a trained counselor call 1-800-273-TALK (toll free, 24 hour hotline) if experiencing a Mental Health or Behavioral Health Crisis  avoid second hand smoke eliminate smoking in my home identify and remove indoor air pollutants limit outdoor activity during cold weather listen for public air quality announcements every day develop a rescue plan eliminate symptom triggers at home follow rescue plan if symptoms flare-up  Follow Up Plan: Telephone follow up appointment with care management team member scheduled for: 05-16-2023 at 230 pm           Consent to Services:  Patient was given information about care management services, agreed to services, and gave verbal consent to participate.   Plan: Telephone follow up appointment with care management team member scheduled for: 05-16-2023 at 230 pm  Alto Denver RN, MSN, CCM RN  Care Manager  [facility]  Ambulatory Care Management  Direct Number: [phone]

## 2023-03-17 ENCOUNTER — Other Ambulatory Visit: Payer: Self-pay

## 2023-03-19 ENCOUNTER — Other Ambulatory Visit (HOSPITAL_BASED_OUTPATIENT_CLINIC_OR_DEPARTMENT_OTHER): Payer: Self-pay

## 2023-03-19 ENCOUNTER — Other Ambulatory Visit (HOSPITAL_COMMUNITY): Payer: Self-pay

## 2023-03-21 ENCOUNTER — Other Ambulatory Visit (HOSPITAL_COMMUNITY): Payer: Self-pay

## 2023-03-21 ENCOUNTER — Other Ambulatory Visit: Payer: Self-pay

## 2023-03-21 ENCOUNTER — Telehealth: Payer: Self-pay

## 2023-03-21 NOTE — Progress Notes (Signed)
   03/21/2023  Patient ID: Blake [name], male   DOB: Apr 20, 1956, 67 y.o.   MRN: [id number]  Received a message from PharmD with [facility] stating patient's insurance has lapsed, and the pharmacy has not recently filled prescriptions for the patient.  Tried to contact patient's sister to discuss and see how I can assist, but I was not able to reach her.  A HIPAA compliant voicemail was left with my direct phone number, so she can return my call.  I will try to call again in a few days if I do not hear back.  [name], PharmD, DPLA

## 2023-03-27 ENCOUNTER — Telehealth: Payer: Self-pay

## 2023-03-27 NOTE — Progress Notes (Signed)
03/27/2023  Patient ID: Blake [name], male   DOB: 1955/09/13, 67 y.o.   MRN: [id number]  Outreach attempt to check on medication access/adherence after being notified by [facility] that patient's insurance has lapsed, and he is past due for refills.  I tried to call his sister, Blake [name] ([place]), but the call went straight to voicemail.  I left HIPAA compliant voicemail with my direct phone number and will try to call again next week if I do not hear back.  [name], PharmD, DPLA

## 2023-03-30 ENCOUNTER — Other Ambulatory Visit (HOSPITAL_COMMUNITY): Payer: Self-pay

## 2023-04-02 ENCOUNTER — Other Ambulatory Visit: Payer: Self-pay

## 2023-04-03 ENCOUNTER — Other Ambulatory Visit: Payer: Self-pay

## 2023-04-03 ENCOUNTER — Other Ambulatory Visit (HOSPITAL_COMMUNITY): Payer: Self-pay

## 2023-04-05 ENCOUNTER — Telehealth: Payer: Self-pay

## 2023-04-05 ENCOUNTER — Other Ambulatory Visit: Payer: Self-pay

## 2023-04-05 NOTE — Progress Notes (Signed)
04/05/2023  Patient ID: Blake [name], male   DOB: 1955-12-29, 67 y.o.   MRN: [id number]  Additional outreach attempt to contact patient's sister, Blake [name] ([place]), regarding his lapse in insurance and likely need for medication refills.  I was able to locate 2 additional phone numbers for her; but no voicemail was set up on one, and the other was non-working.  I was able to leave a voicemail again on number listed for her on his profile.  Neither the patient nor his sister are set up with MyChart.  I also attempted to send an email for her to call me to her listed email address, but that was returned to my inbox.  Contacting PCP and social work to see if they have additional ideas/resources for contacting either the patient or his sister.  [name], PharmD, DPLA

## 2023-05-04 ENCOUNTER — Encounter: Payer: Self-pay | Admitting: Nurse Practitioner

## 2023-05-04 ENCOUNTER — Ambulatory Visit (INDEPENDENT_AMBULATORY_CARE_PROVIDER_SITE_OTHER): Payer: Medicare Other | Admitting: Nurse Practitioner

## 2023-05-04 VITALS — BP 116/72 | HR 73 | Temp 98.1°F | Ht 64.0 in | Wt 159.0 lb

## 2023-05-04 DIAGNOSIS — E7800 Pure hypercholesterolemia, unspecified: Secondary | ICD-10-CM

## 2023-05-04 DIAGNOSIS — F0390 Unspecified dementia without behavioral disturbance: Secondary | ICD-10-CM

## 2023-05-04 DIAGNOSIS — I5022 Chronic systolic (congestive) heart failure: Secondary | ICD-10-CM | POA: Diagnosis not present

## 2023-05-04 DIAGNOSIS — J432 Centrilobular emphysema: Secondary | ICD-10-CM

## 2023-05-04 DIAGNOSIS — F324 Major depressive disorder, single episode, in partial remission: Secondary | ICD-10-CM

## 2023-05-04 DIAGNOSIS — I10 Essential (primary) hypertension: Secondary | ICD-10-CM

## 2023-05-04 DIAGNOSIS — R7301 Impaired fasting glucose: Secondary | ICD-10-CM

## 2023-05-04 DIAGNOSIS — I25118 Atherosclerotic heart disease of native coronary artery with other forms of angina pectoris: Secondary | ICD-10-CM

## 2023-05-04 DIAGNOSIS — E559 Vitamin D deficiency, unspecified: Secondary | ICD-10-CM

## 2023-05-04 DIAGNOSIS — F17220 Nicotine dependence, chewing tobacco, uncomplicated: Secondary | ICD-10-CM

## 2023-05-04 MED ORDER — OMEPRAZOLE 20 MG PO CPDR: 20.0000 mg | DELAYED_RELEASE_CAPSULE | Freq: Every day | ORAL | 4 refills | Status: DC

## 2023-05-04 MED ORDER — ATORVASTATIN CALCIUM 40 MG PO TABS: 40.0000 mg | ORAL_TABLET | Freq: Every day | ORAL | 4 refills | Status: DC

## 2023-05-04 MED ORDER — LISINOPRIL 5 MG PO TABS: 5.0000 mg | ORAL_TABLET | Freq: Every day | ORAL | 4 refills | Status: DC

## 2023-05-04 MED ORDER — METOPROLOL SUCCINATE ER 25 MG PO TB24: 25.0000 mg | ORAL_TABLET | Freq: Every day | ORAL | 4 refills | Status: DC

## 2023-05-04 MED ORDER — B-12 1000 MCG PO TABS: 1.0000 | ORAL_TABLET | Freq: Every day | ORAL | 4 refills | Status: DC

## 2023-05-04 MED ORDER — ANORO ELLIPTA 62.5-25 MCG/ACT IN AEPB: 1.0000 | INHALATION_SPRAY | Freq: Every day | RESPIRATORY_TRACT | 4 refills | Status: DC

## 2023-05-04 MED ORDER — NITROGLYCERIN 0.4 MG SL SUBL: 0.4000 mg | SUBLINGUAL_TABLET | SUBLINGUAL | 1 refills | Status: DC | PRN

## 2023-05-04 MED ORDER — VITAMIN D3 1.25 MG (50000 UT) PO CAPS: 50000.0000 [IU] | ORAL_CAPSULE | ORAL | 4 refills | Status: DC

## 2023-05-04 MED ORDER — RIVASTIGMINE TARTRATE 3 MG PO CAPS: 3.0000 mg | ORAL_CAPSULE | Freq: Two times a day (BID) | ORAL | 4 refills | Status: DC

## 2023-05-04 MED ORDER — ALBUTEROL SULFATE HFA 108 (90 BASE) MCG/ACT IN AERS: 2.0000 | INHALATION_SPRAY | Freq: Four times a day (QID) | RESPIRATORY_TRACT | 2 refills | Status: DC | PRN

## 2023-05-04 MED ORDER — FUROSEMIDE 40 MG PO TABS: 40.0000 mg | ORAL_TABLET | Freq: Every day | ORAL | 4 refills | Status: DC

## 2023-05-04 NOTE — Assessment & Plan Note (Signed)
On recent labs was 7, have sent refills on Vitamin D supplement and recommend he take daily.

## 2023-05-04 NOTE — Assessment & Plan Note (Signed)
Chronic, ongoing.  Denies SI/HI.  No current medications and scoring is stable.  Initiate medication as needed in future.  Could consider Zoloft.

## 2023-05-04 NOTE — Assessment & Plan Note (Signed)
Chronic, progressive.  Continue Exelon per neurology and discussed with his sister need to schedule follow-up with them to ensure ongoing continuity of care.  Provided number to sister, is aware no referral is needed as just had visit with them last year.

## 2023-05-04 NOTE — Patient Instructions (Addendum)
Surgeon Dr. Claudine Mouton: Address: [address], [place], [place] [address] Phone: [phone]  Neurology = [place], Aggie Hacker, [place]  [address]  Trina A[place]  [place], [place] [address]  [phone] (Work)  [phone] (Fax)   Dementia Caregiver Guide Dementia is a condition that affects the way the brain works. It often affects thinking and memory. A person with dementia may: Forget things. Have trouble talking or responding to your questions. Have trouble paying attention. Have trouble thinking clearly and making good decisions. Get lost or wander away from home or other places. Have big changes in their mood or emotions. They may: Feel very worried, nervous, or depressed. Have angry outbursts. Be suspicious or accuse you of things. Have childlike behavior and language. Taking care of someone with dementia can be a challenge. The tips below can help you care for the person. How to help manage lifestyle changes Dementia usually gets worse slowly over time. In the early stages, people with dementia can stay safe and take care of themselves with some help. In later stages, they need help with daily tasks like getting dressed, grooming, and going to the bathroom. Communicating When the person is talking and seems frustrated, make eye contact and hold the person's hand. Ask questions that can be answered with a yes or no. Use simple words and a calm voice. Only give one direction at a time. Limit choices for the person. Too many choices can be stressful. Avoid correcting the person in a negative way. If the person can't find the right words, gently try to help. Preventing injury  Keep floors clear. Remove rugs, magazine racks, and floor lamps. Keep hallways well lit, especially at night. Put a handrail and nonslip mat in the bathtub or shower. Put childproof locks on cabinets that have dangerous items in them. These items include medicine, alcohol, guns, cleaning products, and  sharp tools. For doors to the outside, put locks where the person can't see or reach them. This helps keep the person from going out of the house and getting lost. Be ready for emergencies. Keep a list of emergency phone numbers and addresses close by. Remove car keys and lock garage doors so the person doesn't try to drive. Have the person wear a bracelet that tracks where they are and shows that they're a person with memory loss. This should be worn at all times for safety. Helping with daily life  Keep the person on track with their daily routine. Try to identify areas where the person may need help. Be supportive, patient, calm, and encouraging. Gently remind the person that adjusting to changes takes time. Help with the tasks that the person has asked for help with. Keep the person involved in daily tasks and decisions as much as you can. Encourage conversation, but try not to get frustrated if the person struggles to find words or doesn't seem to appreciate your help. Other tips Think about any safety risks and take steps to avoid them. Keep things organized: Organize medicines in a pill box for each day of the week. Keep a calendar in a central place. Use it to remind the person of health care visits or other activities. Create a plan to handle any legal or financial matters. Get help from a professional if needed. Help make sure the person: Takes medicines only as told by their health care providers. Eats regular, healthy meals. They should also drink plenty of fluids. Goes to all scheduled health care appointments. Gets regular sleep.  Taking care of yourself Being a caregiver for someone with dementia can be hard. You may feel stressed and have many other emotions. It's important to also take care of yourself. Here are some tips: Find out about services that can provide short-term care for the person. This is called respite care. It can allow you to take a break when you need  one. Find healthy ways to deal with stress. Some ways include: Spending time with other people. Exercising. Meditating or doing deep breathing exercises. Take care of your own health by: Getting enough sleep. Eating healthy foods. Getting regular exercise. Join a support group with others who are caregivers. These groups can help you: Learn other ways to deal with stress. Share experiences with others. Get emotional comfort and support. Learn about caregiving as the disease gets worse. Find resources in your community. Where to find support: Many people and organizations offer support. These include: Support groups for people with dementia. Support groups for caregivers. Counselors or therapists. Home health care services. Adult day care centers. Where to find more information Alzheimer's Association: WesternTunes.it Family Caregiver Alliance: caregiver.org Alzheimer's Foundation of [place]: alzfdn.org Contact a health care provider if: The person's health is quickly getting worse. You're no longer able to care for the person. Caring for the person is affecting your physical and emotional health. You're feeling worried, nervous, or depressed about caring for the person. Get help right away if: You feel like the person may hurt themselves or others. The person has talked about taking their own life. These symptoms may be an emergency. Take one of these steps right away: Go to your nearest emergency room. Call 911. Call the National Suicide Prevention Lifeline at (858)534-1100 or 988. Text the Crisis Text Line at 240-389-0294. This information is not intended to replace advice given to you by your health care provider. Make sure you discuss any questions you have with your health care provider. Document Revised: 10/06/2022 Document Reviewed: 10/06/2022 Elsevier Patient Education  2024 [employer].

## 2023-05-04 NOTE — Progress Notes (Signed)
BP 116/72 (BP Location: Left Arm, Patient Position: Sitting, Cuff Size: Large)   Pulse 73   Temp 98.1 F (36.7 C) (Oral)   Ht 5\' 4"  (1.626 m)   Wt 159 lb (72.1 kg)   SpO2 93%   BMI 27.29 kg/m    Subjective:    Patient ID: Blake [name], male    DOB: [birth year], 67 y.o.   MRN: [id number]  HPI: Blake [name] is a 67 y.o. male  Chief Complaint  Patient presents with   Medication Refill    All medications especially inhalers    Medical Management of Chronic Issues    Referral for neurologist, discuss a surgery on the left groin area,  no new concerns    Sister at bedside to assist with HPI, he lives with her.  Has been lost to follow-up since May 2024, missed some visits.  HYPERTENSION / HYPERLIPIDEMIA/HF To be taking Metoprolol, Lisinopril, Lasix -- has been out of these for 4 weeks a sister reports [facility] not [facility].  Last saw cardiology, Dr. Juliann Pares, on 11/27/22 when they cleared him for hernia surgery and increased his Lisinopril to 5 MG.  HF clinic last seen 05/23/21, has missed visits with them.  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  He did not obtain hernia surgery as his sister had knee surgery during the time, he is wanting to know when he can have it.  Sister reports he has been taking medication consistently. Satisfied with current treatment? yes Duration of hypertension: chronic BP monitoring frequency: not checking BP range:  BP medication side effects: no Duration of hyperlipidemia: chronic Cholesterol medication side effects: no Cholesterol supplements: none Medication compliance: poor compliance Aspirin: yes Recent stressors: no Recurrent headaches: no Visual changes: no Palpitations: sometimes Dyspnea: yes Chest pain: no Lower extremity edema:no Dizzy/lightheaded: sometimes  COPD Current smoker -- 1 PPD -- has smoked since he was 79 to 67 years old.  Has not be using Anoro consistently, has been out of this for 4  weeks.  Has been using per his sister. COPD status: uncontrolled Satisfied with current treatment?: yes Oxygen use: no Dyspnea frequency: yes -- not using inhalers as ordered Cough frequency: yes Rescue inhaler frequency:  2 times a day Limitation of activity: no Productive cough: no Last Spirometry: unknown Pneumovax:  provided today Influenza:  refused  PREDIABETES Elevated glucose on past labs.  His las A1c in May 2024 was 5.8%. Polydipsia/polyuria: no Visual disturbance: no Chest pain: no Paresthesias: no  DEPRESSION & MEMORY CHANGES No current medications.  Follows with neurology, last visit 10/14/2021 and was started on Exelon for dementia, 3 MG BID, and stopped Aricept as it was causing muscle cramps.  They do have family history of uncle with dementia.  Low Vitamin D and B12 recent labs, is taking supplements. Mood status: stable Depressed mood: occasional Anxious mood: occasional Anhedonia: no Significant weight loss or gain: no Insomnia: yes hard to fall asleep Fatigue: yes Feelings of worthlessness or guilt: no Impaired concentration/indecisiveness: no Suicidal ideations: no Hopelessness: no Crying spells: no    05/04/2023    3:25 PM 11/15/2022   11:33 AM 02/16/2022    9:12 AM 09/04/2018    1:55 PM  Depression screen PHQ 2/9  Decreased Interest 1 0 0 0  Down, Depressed, Hopeless 1 0 0 0  PHQ - 2 Score 2 0 0 0  Altered sleeping 0 0    Tired, decreased energy  1 0    Change in appetite 0 0    Feeling bad or failure about yourself  0 0    Trouble concentrating  0    Moving slowly or fidgety/restless 0 0    Suicidal thoughts 0 0    PHQ-9 Score 3 0    Difficult doing work/chores  Not difficult at all         05/04/2023    3:26 PM 11/15/2022   11:33 AM  GAD 7 : Generalized Anxiety Score  Nervous, Anxious, on Edge 1 0  Control/stop worrying 0 0  Worry too much - different things 0 0  Trouble relaxing 1 0  Restless 0 0  Easily annoyed or irritable 0 0   Afraid - awful might happen 0 0  Total GAD 7 Score 2 0  Anxiety Difficulty Not difficult at all Not difficult at all      05/04/2023    3:27 PM 02/16/2022    9:08 AM  6CIT Screen  What Year? 4 points 4 points  What month? 0 points 3 points  What time? 3 points 0 points  Count back from 20 0 points 4 points  Months in reverse 2 points 4 points  Repeat phrase 6 points 10 points  Total Score 15 points 25 points   Relevant past medical, surgical, family and social history reviewed and updated as indicated. Interim medical history since our last visit reviewed. Allergies and medications reviewed and updated.  Review of Systems  Constitutional:  Negative for activity change, appetite change, diaphoresis, fatigue and fever.  Respiratory:  Negative for cough, chest tightness, shortness of breath and wheezing.   Cardiovascular:  Negative for chest pain, palpitations and leg swelling.  Gastrointestinal: Negative.   Endocrine: Negative for cold intolerance, heat intolerance, polydipsia, polyphagia and polyuria.  Musculoskeletal: Negative.   Skin: Negative.   Neurological:  Negative for dizziness, syncope, weakness, light-headedness, numbness and headaches.  Psychiatric/Behavioral: Negative.     Per HPI unless specifically indicated above     Objective:    BP 116/72 (BP Location: Left Arm, Patient Position: Sitting, Cuff Size: Large)   Pulse 73   Temp 98.1 F (36.7 C) (Oral)   Ht 5\' 4"  (1.626 m)   Wt 159 lb (72.1 kg)   SpO2 93%   BMI 27.29 kg/m   Wt Readings from Last 3 Encounters:  05/04/23 159 lb (72.1 kg)  11/15/22 159 lb 12.8 oz (72.5 kg)  11/07/22 158 lb (71.7 kg)    Physical Exam Vitals and nursing note reviewed. Exam conducted with a chaperone present.  Constitutional:      General: He is awake. He is not in acute distress.    Appearance: He is well-developed and well-groomed. He is not ill-appearing or toxic-appearing.  HENT:     Head: Normocephalic.     Right  Ear: Hearing and external ear normal.     Left Ear: Hearing and external ear normal.  Eyes:     General: Lids are normal.     Extraocular Movements: Extraocular movements intact.     Conjunctiva/sclera: Conjunctivae normal.  Neck:     Thyroid: No thyromegaly.     Vascular: No carotid bruit.  Cardiovascular:     Rate and Rhythm: Normal rate and regular rhythm.     Heart sounds: Normal heart sounds. No murmur heard.    No gallop.  Pulmonary:     Effort: No accessory muscle usage or respiratory distress.     Breath  sounds: Wheezing present. No decreased breath sounds, rhonchi or rales.     Comments: Expiratory wheezes throughout noted, no rhonchi.  No coughing today. Abdominal:     General: Bowel sounds are normal. There is no distension.     Palpations: Abdomen is soft. There is no hepatomegaly.     Tenderness: There is no abdominal tenderness.  Musculoskeletal:     Cervical back: Full passive range of motion without pain.     Right lower leg: Edema (trace) present.     Left lower leg: Edema (trace) present.  Lymphadenopathy:     Cervical: No cervical adenopathy.  Skin:    General: Skin is warm.     Capillary Refill: Capillary refill takes less than 2 seconds.  Neurological:     Mental Status: He is alert.     Cranial Nerves: Cranial nerves 2-12 are intact.     Deep Tendon Reflexes: Reflexes are normal and symmetric.     Reflex Scores:      Brachioradialis reflexes are 2+ on the right side and 2+ on the left side.      Patellar reflexes are 2+ on the right side and 2+ on the left side.    Comments: Reports it is 2004 and Monday.  Is able to state month as October.  Able to state city and state.  Not able to report President.  Pleasantly confused.  Psychiatric:        Attention and Perception: Attention normal.        Mood and Affect: Mood normal.        Speech: Speech normal.        Behavior: Behavior normal. Behavior is cooperative.        Cognition and Memory: Memory is  impaired.    Results for orders placed or performed in visit on 11/15/22  CBC with Differential/Platelet  Result Value Ref Range   WBC 3.7 3.4 - 10.8 x10E3/uL   RBC 4.28 4.14 - 5.80 x10E6/uL   Hemoglobin 13.2 13.0 - 17.7 g/dL   Hematocrit 69.6 29.5 - 51.0 %   MCV 90 79 - 97 fL   MCH 30.8 26.6 - 33.0 pg   MCHC 34.5 31.5 - 35.7 g/dL   RDW 28.4 (L) 13.2 - 44.0 %   Platelets 194 150 - 450 x10E3/uL   Neutrophils 56 Not Estab. %   Lymphs 30 Not Estab. %   Monocytes 10 Not Estab. %   Eos 3 Not Estab. %   Basos 1 Not Estab. %   Neutrophils Absolute 2.1 1.4 - 7.0 x10E3/uL   Lymphocytes Absolute 1.1 0.7 - 3.1 x10E3/uL   Monocytes Absolute 0.4 0.1 - 0.9 x10E3/uL   EOS (ABSOLUTE) 0.1 0.0 - 0.4 x10E3/uL   Basophils Absolute 0.0 0.0 - 0.2 x10E3/uL   Immature Granulocytes 0 Not Estab. %   Immature Grans (Abs) 0.0 0.0 - 0.1 x10E3/uL  Comprehensive metabolic panel  Result Value Ref Range   Glucose 77 70 - 99 mg/dL   BUN 13 8 - 27 mg/dL   Creatinine, Ser 1.02 0.76 - 1.27 mg/dL   eGFR 80 >72 ZD/GUY/4.03   BUN/Creatinine Ratio 13 10 - 24   Sodium 139 134 - 144 mmol/L   Potassium 4.3 3.5 - 5.2 mmol/L   Chloride 104 96 - 106 mmol/L   CO2 26 20 - 29 mmol/L   Calcium 9.3 8.6 - 10.2 mg/dL   Total Protein 7.3 6.0 - 8.5 g/dL   Albumin 3.5 (L) 3.9 -  4.9 g/dL   Globulin, Total 3.8 1.5 - 4.5 g/dL   Albumin/Globulin Ratio 0.9 (L) 1.2 - 2.2   Bilirubin Total 0.6 0.0 - 1.2 mg/dL   Alkaline Phosphatase 107 44 - 121 IU/L   AST 26 0 - 40 IU/L   ALT 18 0 - 44 IU/L  Lipid Panel w/o Chol/HDL Ratio  Result Value Ref Range   Cholesterol, Total 118 100 - 199 mg/dL   Triglycerides 73 0 - 149 mg/dL   HDL 43 >40 mg/dL   VLDL Cholesterol Cal 15 5 - 40 mg/dL   LDL Chol Calc (NIH) 60 0 - 99 mg/dL  TSH  Result Value Ref Range   TSH 1.010 0.450 - 4.500 uIU/mL  VITAMIN D 25 Hydroxy (Vit-D Deficiency, Fractures)  Result Value Ref Range   Vit D, 25-Hydroxy 7.0 (L) 30.0 - 100.0 ng/mL  Vitamin B12  Result  Value Ref Range   Vitamin B-12 397 232 - 1,245 pg/mL  HgB A1c  Result Value Ref Range   Hgb A1c MFr Bld 5.8 (H) 4.8 - 5.6 %   Est. average glucose Bld gHb Est-mCnc 120 mg/dL  B Nat Peptide  Result Value Ref Range   BNP 581.1 (H) 0.0 - 100.0 pg/mL  PSA  Result Value Ref Range   Prostate Specific Ag, Serum 1.4 0.0 - 4.0 ng/mL      Assessment & Plan:   Problem List Items Addressed This Visit       Cardiovascular and Mediastinum   Chronic systolic CHF (congestive heart failure) (HCC)    Chronic, ongoing.  Euvolemic.  Sister reports he had been taking medication consistently until he ran out recently, refills sent.  Sister is main caregiver and reports since the discussion with PCP he has been adherent.  ?need for pill packs.  Working with PharmD.  Refills sent in. Recommend: - Reminded to call for an overnight weight gain of >2 pounds or a weekly weight gain of >5 pounds - not adding salt to food and read food labels. Reviewed the importance of keeping daily sodium intake to 2000mg  daily.  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  Continue to collaborate with cardiology and discussed with his sister and him importance of adherence to regimen.  Sister reports he had been taking medication consistently until he ran out recently, refills sent.  Sister is main caregiver and reports since the discussion with PCP he has been adherent.  ?need for pill packs.  Working with PharmD.        Relevant Medications   atorvastatin (LIPITOR) 40 MG tablet   furosemide (LASIX) 40 MG tablet   metoprolol succinate (TOPROL-XL) 25 MG 24 hr tablet    lisinopril (ZESTRIL) 5 MG tablet   nitroGLYCERIN (NITROSTAT) 0.4 MG SL tablet   Essential (primary) hypertension    Chronic, ongoing.  BP much improved with taking medication as ordered. Sister reports he had been taking medication consistently until he ran out recently, refills sent.  Sister is main caregiver and reports since the discussion with PCP he has been adherent. Working with PharmD.  Recommend he monitor  BP at least a few mornings a week at home and document.  DASH diet at home.  Continue current medication regimen and adjust as needed.  Labs today: CBC, CMP.        Relevant Medications   atorvastatin (LIPITOR) 40 MG tablet   furosemide (LASIX) 40 MG tablet   metoprolol succinate (TOPROL-XL) 25 MG 24 hr tablet   lisinopril (ZESTRIL) 5 MG tablet   nitroGLYCERIN (NITROSTAT) 0.4 MG SL tablet   Other Relevant Orders   CBC with Differential/Platelet   Comprehensive metabolic panel     Respiratory   Centrilobular emphysema (HCC)    Chronic, ongoing in daily smoker.  Recommend complete cessation of smoking.  Was using inhalers consistently before he ran out 4 weeks ago.  Sister is main caregiver and reports he had been using since his talk with PCP.  Working with PharmD and SW.  Discussed with him importance of using Anoro daily as is preventative to help with symptoms and to use Albuterol only as needed, educated sister and him on this.  Would benefit lung CT cancer screening, but refuses at this time.        Relevant Medications   albuterol (VENTOLIN HFA) 108 (90 Base) MCG/ACT inhaler   umeclidinium-vilanterol (ANORO ELLIPTA) 62.5-25 MCG/ACT AEPB     Endocrine   IFG (impaired fasting glucose)    A1c last check had improved to 5.8%, recheck today and initiate medication as needed.      Relevant Orders   HgB A1c     Nervous and Auditory   Dementia without behavioral disturbance (HCC) - Primary    Chronic, progressive.  Continue Exelon per neurology and discussed with his sister  need to schedule follow-up with them to ensure ongoing continuity of care.  Provided number to sister, is aware no referral is needed as just had visit with them last year.       Relevant Medications   rivastigmine (EXELON) 3 MG capsule   Other Relevant Orders   Vitamin B12     Other   Clinical depression    Chronic, ongoing.  Denies SI/HI.  No current medications and scoring is stable.  Initiate medication as needed in future.  Could consider Zoloft.      Hypercholesteremia    Chronic, ongoing.  Continue current medication regimen and adjust as needed.  Lipid panel today.  Sister reports he had been taking medication consistently until he ran out recently, refills sent.  Sister is main caregiver and reports since the discussion with PCP he has been adherent.   Working with PharmD.         Relevant Medications   atorvastatin (LIPITOR) 40 MG tablet   furosemide (LASIX) 40 MG tablet   metoprolol succinate (TOPROL-XL) 25 MG 24 hr tablet   lisinopril (ZESTRIL) 5 MG tablet   nitroGLYCERIN (NITROSTAT) 0.4 MG SL tablet   Other Relevant Orders   Comprehensive metabolic panel   Lipid Panel w/o Chol/HDL Ratio   Nicotine dependence, chewing tobacco, uncomplicated    I have recommended complete cessation of tobacco use. I have discussed various options available for assistance with tobacco cessation including over the counter methods (Nicotine gum, patch and lozenges). We also discussed prescription options (Chantix, Nicotine Inhaler / Nasal Spray). The patient is not interested in pursuing any prescription tobacco cessation options at this time.       Vitamin D deficiency    On recent labs was 7, have sent refills on Vitamin D supplement and recommend  he take daily.      Relevant Orders   VITAMIN D 25 Hydroxy (Vit-D Deficiency, Fractures)      Follow up plan: Return in about 3 months (around 08/04/2023) for HTN/HLD/HF, COPD, MOOD, MEMORY + needs phone visit for Medicare  Wellness.

## 2023-05-04 NOTE — Assessment & Plan Note (Signed)
Chronic, ongoing.  Continue current medication regimen and adjust as needed.  Lipid panel today.  Sister reports he had been taking medication consistently until he ran out recently, refills sent.  Sister is main caregiver and reports since the discussion with PCP he has been adherent.   Working with PharmD.

## 2023-05-04 NOTE — Assessment & Plan Note (Addendum)
Chronic, ongoing.  Euvolemic.  Sister reports he had been taking medication consistently until he ran out recently, refills sent.  Sister is main caregiver and reports since the discussion with PCP he has been adherent.  ?need for pill packs.  Working with PharmD.  Refills sent in. Recommend: - Reminded to call for an overnight weight gain of >2 pounds or a weekly weight gain of >5 pounds - not adding salt to food and read food labels. Reviewed the importance of keeping daily sodium intake to 2000mg  daily.  - Avoid Ibuprofen products

## 2023-05-04 NOTE — Assessment & Plan Note (Signed)
Chronic, ongoing.  Continue to collaborate with cardiology and discussed with his sister and him importance of adherence to regimen.  Sister reports he had been taking medication consistently until he ran out recently, refills sent.  Sister is main caregiver and reports since the discussion with PCP he has been adherent.  ?need for pill packs.  Working with PharmD.

## 2023-05-04 NOTE — Assessment & Plan Note (Signed)
A1c last check had improved to 5.8%, recheck today and initiate medication as needed.

## 2023-05-04 NOTE — Assessment & Plan Note (Signed)
I have recommended complete cessation of tobacco use. I have discussed various options available for assistance with tobacco cessation including over the counter methods (Nicotine gum, patch and lozenges). We also discussed prescription options (Chantix, Nicotine Inhaler / Nasal Spray). The patient is not interested in pursuing any prescription tobacco cessation options at this time.  

## 2023-05-04 NOTE — Assessment & Plan Note (Signed)
Chronic, ongoing.  BP much improved with taking medication as ordered. Sister reports he had been taking medication consistently until he ran out recently, refills sent.  Sister is main caregiver and reports since the discussion with PCP he has been adherent. Working with PharmD.  Recommend he monitor BP at least a few mornings a week at home and document.  DASH diet at home.  Continue current medication regimen and adjust as needed.  Labs today: CBC, CMP.

## 2023-05-04 NOTE — Assessment & Plan Note (Signed)
Chronic, ongoing in daily smoker.  Recommend complete cessation of smoking.  Was using inhalers consistently before he ran out 4 weeks ago.  Sister is main caregiver and reports he had been using since his talk with PCP.  Working with PharmD and SW.  Discussed with him importance of using Anoro daily as is preventative to help with symptoms and to use Albuterol only as needed, educated sister and him on this.  Would benefit lung CT cancer screening, but refuses at this time.

## 2023-05-05 LAB — COMPREHENSIVE METABOLIC PANEL
ALT: 29 [IU]/L (ref 0–44)
AST: 38 [IU]/L (ref 0–40)
Albumin: 3.7 g/dL — ABNORMAL LOW (ref 3.9–4.9)
Alkaline Phosphatase: 103 [IU]/L (ref 44–121)
BUN/Creatinine Ratio: 16 (ref 10–24)
BUN: 15 mg/dL (ref 8–27)
Bilirubin Total: 0.5 mg/dL (ref 0.0–1.2)
CO2: 24 mmol/L (ref 20–29)
Calcium: 9.5 mg/dL (ref 8.6–10.2)
Chloride: 106 mmol/L (ref 96–106)
Creatinine, Ser: 0.95 mg/dL (ref 0.76–1.27)
Globulin, Total: 3.8 g/dL (ref 1.5–4.5)
Glucose: 117 mg/dL — ABNORMAL HIGH (ref 70–99)
Potassium: 4.2 mmol/L (ref 3.5–5.2)
Sodium: 142 mmol/L (ref 134–144)
Total Protein: 7.5 g/dL (ref 6.0–8.5)
eGFR: 87 mL/min/{1.73_m2} (ref >59)

## 2023-05-05 LAB — CBC WITH DIFFERENTIAL/PLATELET
Basophils Absolute: 0 10*3/uL (ref 0.0–0.2)
Basos: 1 %
EOS (ABSOLUTE): 0.1 10*3/uL (ref 0.0–0.4)
Eos: 2 %
Hematocrit: 39.6 % (ref 37.5–51.0)
Hemoglobin: 13.1 g/dL (ref 13.0–17.7)
Immature Grans (Abs): 0 10*3/uL (ref 0.0–0.1)
Immature Granulocytes: 1 %
Lymphocytes Absolute: 1.1 10*3/uL (ref 0.7–3.1)
Lymphs: 29 %
MCH: 31 pg (ref 26.6–33.0)
MCHC: 33.1 g/dL (ref 31.5–35.7)
MCV: 94 fL (ref 79–97)
Monocytes Absolute: 0.4 10*3/uL (ref 0.1–0.9)
Monocytes: 11 %
Neutrophils Absolute: 2.2 10*3/uL (ref 1.4–7.0)
Neutrophils: 56 %
Platelets: 191 10*3/uL (ref 150–450)
RBC: 4.22 x10E6/uL (ref 4.14–5.80)
RDW: 11.6 % (ref 11.6–15.4)
WBC: 3.9 10*3/uL (ref 3.4–10.8)

## 2023-05-05 LAB — LIPID PANEL W/O CHOL/HDL RATIO
Cholesterol, Total: 120 mg/dL (ref 100–199)
HDL: 39 mg/dL — ABNORMAL LOW (ref >39)
LDL Chol Calc (NIH): 64 mg/dL (ref 0–99)
Triglycerides: 87 mg/dL (ref 0–149)
VLDL Cholesterol Cal: 17 mg/dL (ref 5–40)

## 2023-05-05 LAB — VITAMIN D 25 HYDROXY (VIT D DEFICIENCY, FRACTURES): Vit D, 25-Hydroxy: 13.6 ng/mL — ABNORMAL LOW (ref 30.0–100.0)

## 2023-05-05 LAB — HEMOGLOBIN A1C
Est. average glucose Bld gHb Est-mCnc: 120 mg/dL
Hgb A1c MFr Bld: 5.8 % — ABNORMAL HIGH (ref 4.8–5.6)

## 2023-05-05 LAB — VITAMIN B12: Vitamin B-12: 274 pg/mL (ref 232–1245)

## 2023-05-05 NOTE — Progress Notes (Signed)
Good morning, please let Alonza and his sister know his labs have returned and overall remain at baseline for him.  His Vitamin D and B12 levels are still low, please take the supplements I sent in and if you do not obtain them let us know and we will reach out to pharmacy.  Continue all current medications and ensure to take as ordered.  Any questions? Keep being stellar!!  Thank you for allowing me to participate in your care.  I appreciate you. Kindest regards, Pheonix Wisby

## 2023-05-16 ENCOUNTER — Other Ambulatory Visit: Payer: Medicare Other

## 2023-05-30 ENCOUNTER — Other Ambulatory Visit: Payer: Self-pay

## 2023-06-11 ENCOUNTER — Other Ambulatory Visit: Payer: Self-pay

## 2023-06-27 ENCOUNTER — Other Ambulatory Visit: Payer: Self-pay

## 2023-07-06 ENCOUNTER — Other Ambulatory Visit: Payer: Self-pay | Admitting: Nurse Practitioner

## 2023-07-06 NOTE — Telephone Encounter (Signed)
Medication Refill -  Most Recent Primary Care Visit:  Provider: Aura Dials T  Department: [facility]  Visit Type: OFFICE VISIT  Date: 05/04/2023 ipratropium-albuterol (DUONEB) 0.5-2.5 (3) MG/3ML nebulizer solution 3 mL   [[id number]]  albuterol (VENTOLIN HFA) 108 (90 Base) MCG/ACT inhaler [[id number]] Medication:   Has the patient contacted their pharmacy? No (Agent: If no, request that the patient contact the pharmacy for the refill. If patient does not wish to contact the pharmacy document the reason why and proceed with request.) (Agent: If yes, when and what did the pharmacy advise?)  Is this the correct pharmacy for this prescription? Yes If no, delete pharmacy and type the correct one.  This is the patient's preferred pharmacy:  [facility] [address] [place] - [address] [place] [address] Phone: [phone] Fax: [phone]    Has the prescription been filled recently? No  Is the patient out of the medication? No  Has the patient been seen for an appointment in the last year OR does the patient have an upcoming appointment? Yes  Can we respond through MyChart? No  Agent: Please be advised that Rx refills may take up to 3 business days. We ask that you follow-up with your pharmacy.
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
## 2023-07-09 ENCOUNTER — Ambulatory Visit: Payer: Medicare Other

## 2023-07-11 NOTE — Telephone Encounter (Signed)
 [facility]. Duoneb requested, but is not on profile. Last refill was 08/15/21 by Dr Kennedy Nakayama  Requested Prescriptions  Pending Prescriptions Disp Refills   albuterol  (VENTOLIN  HFA) 108 (90 Base) MCG/ACT inhaler 18 g 2    Sig: Inhale 2 puffs into the lungs every 6 (six) hours as needed for wheeze/shortness of breath.     Pulmonology:  Beta Agonists 2 Passed - 07/11/2023 12:24 PM      Passed - Last BP in normal range    BP Readings from Last 1 Encounters:  05/04/23 116/72         Passed - Last Heart Rate in normal range    Pulse Readings from Last 1 Encounters:  05/04/23 73         Passed - Valid encounter within last 12 months    Recent Outpatient Visits           2 months ago Dementia without behavioral disturbance (HCC)   [place] [facility] [place], Pennington T, NP   7 months ago Chronic systolic CHF (congestive heart failure) (HCC)   [place] [facility] [place], Melanie T, NP   1 year ago Chronic pain of right knee   [place] [facility] Vigg, Avanti, MD   3 years ago Hypercholesteremia   [place] [facility] [place], Nat HERO, FNP   4 years ago Centrilobular emphysema [facility])   [place] [facility] [place], Tinnie Fuller, NP       Future Appointments             In 3 weeks Cannady, Jolene T, NP [place] [facility], [facility]

## 2023-07-12 MED ORDER — ALBUTEROL SULFATE HFA 108 (90 BASE) MCG/ACT IN AERS: 2.0000 | INHALATION_SPRAY | Freq: Four times a day (QID) | RESPIRATORY_TRACT | 2 refills | Status: DC | PRN

## 2023-07-23 ENCOUNTER — Other Ambulatory Visit: Payer: Self-pay | Admitting: Nurse Practitioner

## 2023-07-23 NOTE — Telephone Encounter (Signed)
 Medication Refill -  Most Recent Primary Care Visit:  Provider: CANNADY, JOLENE T  Department: [facility]  Visit Type: OFFICE VISIT  Date: 05/04/2023  Medication: albuterol  (VENTOLIN  HFA) 108 (90 Base) MCG/ACT inhaler   umeclidinium-vilanterol (ANORO ELLIPTA ) 62.5-25 MCG/ACT AEPB  Pt unable to pick up prescriptions. Pt contacted pharmacy and was told no prescriptions sent by office. Albuterol  shows sent to pharmacy on 07/12/2023.   Has the patient contacted their pharmacy? Yes  Is this the correct pharmacy for this prescription? Yes This is the patient's preferred pharmacy:  [facility] [address] [place] - [address] [place] [address] Phone: [phone] Fax: [phone]  Has the prescription been filled recently? Yes  Is the patient out of the medication? Yes  Has the patient been seen for an appointment in the last year OR does the patient have an upcoming appointment? Yes  Can we respond through MyChart? Yes  Agent: Please be advised that Rx refills may take up to 3 business days. We ask that you follow-up with your pharmacy.

## 2023-07-24 NOTE — Telephone Encounter (Signed)
 Requests too soon for refill.  Requested Prescriptions  Pending Prescriptions Disp Refills   umeclidinium-vilanterol (ANORO ELLIPTA ) 62.5-25 MCG/ACT AEPB 60 each 4    Sig: Inhale 1 puff into the lungs daily at 6 (six) AM.     Pulmonology:  Combination Products Passed - 07/24/2023  2:33 PM      Passed - Valid encounter within last 12 months    Recent Outpatient Visits           2 months ago Dementia without behavioral disturbance (HCC)   [place] [facility] [place], [place] T, NP   8 months ago Chronic systolic CHF (congestive heart failure) (HCC)   [place] [facility] [place], Melanie T, NP   1 year ago Chronic pain of right knee   [place] [facility] Vigg, Avanti, MD   3 years ago Hypercholesteremia   [place] [facility] [place], Nat HERO, FNP   4 years ago Centrilobular emphysema [facility])   [facility] N[facility], Tinnie Fuller, NP       Future Appointments             In 1 week Cannady, Jolene T, NP [place] [facility]             albuterol  (VENTOLIN  HFA) 108 (90 Base) MCG/ACT inhaler 18 g 2    Sig: Inhale 2 puffs into the lungs every 6 (six) hours as needed for wheeze/shortness of breath.     Pulmonology:  Beta Agonists 2 Passed - 07/24/2023  2:33 PM      Passed - Last BP in normal range    BP Readings from Last 1 Encounters:  05/04/23 116/72         Passed - Last Heart Rate in normal range    Pulse Readings from Last 1 Encounters:  05/04/23 73         Passed - Valid encounter within last 12 months    Recent Outpatient Visits           2 months ago Dementia without behavioral disturbance (HCC)   [place] [facility] [place], [place] T, NP   8 months ago Chronic systolic CHF (congestive heart failure) (HCC)   [place] [facility] [place], Melanie T, NP   1 year ago Chronic pain of right knee   [place] [facility] Vigg, Avanti, MD   3 years ago Hypercholesteremia   [place] [facility] [place], Nat HERO, FNP   4 years ago Centrilobular emphysema [facility])   [place] [facility] Nyle, Tinnie Fuller, NP       Future Appointments             In 1 week Cannady, Jolene T, NP [place] [facility]

## 2023-07-26 ENCOUNTER — Ambulatory Visit: Payer: Self-pay

## 2023-07-26 ENCOUNTER — Telehealth: Payer: Medicare Other | Admitting: Physician Assistant

## 2023-07-26 DIAGNOSIS — J432 Centrilobular emphysema: Secondary | ICD-10-CM | POA: Diagnosis not present

## 2023-07-26 DIAGNOSIS — J069 Acute upper respiratory infection, unspecified: Secondary | ICD-10-CM | POA: Diagnosis not present

## 2023-07-26 MED ORDER — AMOXICILLIN-POT CLAVULANATE 875-125 MG PO TABS: 1.0000 | ORAL_TABLET | Freq: Two times a day (BID) | ORAL | 0 refills | Status: DC

## 2023-07-26 MED ORDER — BENZONATATE 100 MG PO CAPS: 100.0000 mg | ORAL_CAPSULE | Freq: Two times a day (BID) | ORAL | 0 refills | Status: DC | PRN

## 2023-07-26 NOTE — Addendum Note (Signed)
Addended by: Jacquelin Hawking on: 07/26/2023 04:39 PM   Modules accepted: Orders, Level of Service

## 2023-07-26 NOTE — Telephone Encounter (Signed)
     Chief Complaint: Non-productive cough with wheezing, runny nose. Has been out of his inhalers. Caregiver will call pharmacy. Symptoms: Above Frequency: 3 weeks Pertinent Negatives: Patient denies fever Disposition: [] ED /[] Urgent Care (no appt availability in office) / [x] Appointment(In office/virtual)/ []  [place] [facility]/ [] Home Care/ [] Refused Recommended Disposition /[] [place] [facility]/ []  Follow-up with PCP Additional Notes: Agrees with appointment.  Reason for Disposition  [1] MILD difficulty breathing (e.g., minimal/no SOB at rest, SOB with walking, pulse <100) AND [2] still present when not coughing  Answer Assessment - Initial Assessment Questions 1. ONSET: "When did the cough begin?"      3 weeks 2. SEVERITY: "How bad is the cough today?"      severe 3. SPUTUM: "Describe the color of your sputum" (none, dry cough; clear, white, yellow, green)     None 4. HEMOPTYSIS: "Are you coughing up any blood?" If so ask: "How much?" (flecks, streaks, tablespoons, etc.)     No 5. DIFFICULTY BREATHING: "Are you having difficulty breathing?" If Yes, ask: "How bad is it?" (e.g., mild, moderate, severe)    - MILD: No SOB at rest, mild SOB with walking, speaks normally in sentences, can lie down, no retractions, pulse < 100.    - MODERATE: SOB at rest, SOB with minimal exertion and prefers to sit, cannot lie down flat, speaks in phrases, mild retractions, audible wheezing, pulse 100-120.    - SEVERE: Very SOB at rest, speaks in single words, struggling to breathe, sitting hunched forward, retractions, pulse > 120      Mild-moderate 6. FEVER: "Do you have a fever?" If Yes, ask: "What is your temperature, how was it measured, and when did it start?"     No 7. CARDIAC HISTORY: "Do you have any history of heart disease?" (e.g., heart attack, congestive heart failure)      CHF 8. LUNG HISTORY: "Do you have any history of lung disease?"  (e.g., pulmonary embolus, asthma,  emphysema)     yES 9. PE RISK FACTORS: "Do you have a history of blood clots?" (or: recent major surgery, recent prolonged travel, bedridden)     No 10. OTHER SYMPTOMS: "Do you have any other symptoms?" (e.g., runny nose, wheezing, chest pain)       Runny nose 11. PREGNANCY: "Is there any chance you are pregnant?" "When was your last menstrual period?"       No 12. TRAVEL: "Have you traveled out of the country in the last month?" (e.g., travel history, exposures)       No  Protocols used: Cough - Acute Non-Productive-A-AH

## 2023-07-26 NOTE — Progress Notes (Addendum)
Virtual Visit via Video Note  I connected with Blake [name] on 07/26/23 at  2:40 PM EST by a video enabled telemedicine application and verified that I am speaking with the correct person using two identifiers.  Today's Provider: Jacquelin Hawking, MHS, PA-C Introduced myself to the patient as a PA-C and provided education on APPs in clinical practice.   Location: Patient: at home  Provider: [facility], [place], [place]    I discussed the limitations of evaluation and management by telemedicine and the availability of in person appointments. The patient expressed understanding and agreed to proceed.  Chief Complaint  Patient presents with   Cough    x3 weeks- Non-productive, causing wheezing.    History of Present Illness:   Discussed the use of AI scribe software for clinical note transcription with the patient, who gave verbal consent to proceed.  History of Present Illness   The patient, with a history of congestive heart failure, presented with a cough of approximately two weeks duration. The cough is described as explosive in nature and is associated with wheezing and difficulty breathing. The patient has been self-medicating with Robitussin and Tylenol, but these have provided minimal relief. The patient also reports increased use of his rescue inhaler, beyond the recommended dosage. The cough is occasionally productive, with the sputum described as sometimes dry and sometimes wet. Accompanying symptoms include a mild headache and a subjective fever, but no nausea or vomiting. The patient denies recent exposure to sick contacts and has no known drug allergies.        Observations/Objective:  Due to the nature of the virtual visit, physical exam and observations are limited. Able to obtain the following observations:   Alert, appears a bit confused - unsure if this is his baseline  Appears comfortable, in no acute distress.  No scleral injection, no  appreciated hoarseness, tachypnea, wheeze or strider. Able to maintain conversation without visible strain.  No cough appreciated during visit.    Assessment and Plan:  Problem List Items Addressed This Visit       Respiratory   Centrilobular emphysema (HCC)   Relevant Medications   benzonatate (TESSALON) 100 MG capsule   Other Visit Diagnoses       Upper respiratory tract infection, unspecified type    -  Primary   Relevant Medications   amoxicillin-clavulanate (AUGMENTIN) 875-125 MG tablet   benzonatate (TESSALON) 100 MG capsule      Assessment and Plan    Acute Respiratory Infection/ exacerbation of chronic emphysema  Presents with a productive cough, wheezing, and difficulty breathing for the past couple of weeks. Symptoms include both wet and dry cough, small headache, but no fever, chills, nausea, or vomiting. No recent exposure to sick contacts. Suspected acute respiratory infection. Informed consent provided regarding antibiotics and cough medication, including potential side effects and the importance of completing the antibiotic course. Advised to monitor for worsening symptoms and seek emergency care if necessary. - Prescribe Augmentin  - Prescribe Tessalon pearls for cough - Advise to seek emergency care if symptoms worsen, such as increased difficulty breathing or fever      Follow Up Instructions:    I discussed the assessment and treatment plan with the patient. The patient was provided an opportunity to ask questions and all were answered. The patient agreed with the plan and demonstrated an understanding of the instructions.   The patient was advised to call back or seek an in-person evaluation if the symptoms worsen or if  the condition fails to improve as anticipated.  I provided 16 minutes of non-face-to-face time during this encounter.  No follow-ups on file.   I, Eyva Califano E Iyah Laguna, PA-C, have reviewed all documentation for this visit. The documentation on  07/26/23 for the exam, diagnosis, procedures, and orders are all accurate and complete.   Jacquelin Hawking, MHS, PA-C [facility]

## 2023-07-29 DIAGNOSIS — E538 Deficiency of other specified B group vitamins: Secondary | ICD-10-CM | POA: Insufficient documentation

## 2023-08-01 ENCOUNTER — Ambulatory Visit: Payer: Medicaid Other | Admitting: Nurse Practitioner

## 2023-08-01 DIAGNOSIS — B182 Chronic viral hepatitis C: Secondary | ICD-10-CM

## 2023-08-01 DIAGNOSIS — F324 Major depressive disorder, single episode, in partial remission: Secondary | ICD-10-CM

## 2023-08-01 DIAGNOSIS — E7800 Pure hypercholesterolemia, unspecified: Secondary | ICD-10-CM

## 2023-08-01 DIAGNOSIS — Z0000 Encounter for general adult medical examination without abnormal findings: Secondary | ICD-10-CM

## 2023-08-01 DIAGNOSIS — E538 Deficiency of other specified B group vitamins: Secondary | ICD-10-CM

## 2023-08-01 DIAGNOSIS — N400 Enlarged prostate without lower urinary tract symptoms: Secondary | ICD-10-CM

## 2023-08-01 DIAGNOSIS — I25118 Atherosclerotic heart disease of native coronary artery with other forms of angina pectoris: Secondary | ICD-10-CM

## 2023-08-01 DIAGNOSIS — E559 Vitamin D deficiency, unspecified: Secondary | ICD-10-CM

## 2023-08-01 DIAGNOSIS — F0390 Unspecified dementia without behavioral disturbance: Secondary | ICD-10-CM

## 2023-08-01 DIAGNOSIS — I5022 Chronic systolic (congestive) heart failure: Secondary | ICD-10-CM

## 2023-08-01 DIAGNOSIS — I10 Essential (primary) hypertension: Secondary | ICD-10-CM

## 2023-08-01 DIAGNOSIS — J432 Centrilobular emphysema: Secondary | ICD-10-CM

## 2023-08-06 ENCOUNTER — Ambulatory Visit: Payer: Medicare Other | Admitting: Nurse Practitioner

## 2023-08-12 NOTE — Patient Instructions (Addendum)
 Increase Lasix  to 80 MG daily (two 40 MG pills) for 5 days.  Start Augmentin  twice a day for 7 days. Restart Metoprolol  -- I ordered refills.  Call cardiology at [phone] -- needs to see Dr. Florencio for follow-up.  Call [facility] at [phone] -- need to see Ellouise for follow-up.  Heart-Healthy Eating Plan Many factors influence your heart health, including eating and exercise habits. Heart health is also called coronary health. Coronary risk increases with abnormal blood fat (lipid) levels. A heart-healthy eating plan includes limiting unhealthy fats, increasing healthy fats, limiting salt (sodium) intake, and making other diet and lifestyle changes. What is my plan? Your health care provider may recommend that: You limit your fat intake to _________% or less of your total calories each day. You limit your saturated fat intake to _________% or less of your total calories each day. You limit the amount of cholesterol in your diet to less than _________ mg per day. You limit the amount of sodium in your diet to less than _________ mg per day. What are tips for following this plan? Cooking Cook foods using methods other than frying. Baking, boiling, grilling, and broiling are all good options. Other ways to reduce fat include: Removing the skin from poultry. Removing all visible fats from meats. Steaming vegetables in water or broth. Meal planning  At meals, imagine dividing your plate into fourths: Fill one-half of your plate with vegetables and green salads. Fill one-fourth of your plate with whole grains. Fill one-fourth of your plate with lean protein foods. Eat 2-4 cups of vegetables per day. One cup of vegetables equals 1 cup (91 g) broccoli or cauliflower florets, 2 medium carrots, 1 large bell pepper, 1 large sweet potato, 1 large tomato, 1 medium white potato, 2 cups (150 g) raw leafy greens. Eat 1-2 cups of fruit per day. One cup of fruit equals 1 small  apple, 1 large banana, 1 cup (237 g) mixed fruit, 1 large orange,  cup (82 g) dried fruit, 1 cup (240 mL) 100% fruit juice. Eat more foods that contain soluble fiber. Examples include apples, broccoli, carrots, beans, peas, and barley. Aim to get 25-30 g of fiber per day. Increase your consumption of legumes, nuts, and seeds to 4-5 servings per week. One serving of dried beans or legumes equals  cup (90 g) cooked, 1 serving of nuts is  oz (12 almonds, 24 pistachios, or 7 walnut halves), and 1 serving of seeds equals  oz (8 g). Fats Choose healthy fats more often. Choose monounsaturated and polyunsaturated fats, such as olive and canola oils, avocado oil, flaxseeds, walnuts, almonds, and seeds. Eat more omega-3 fats. Choose salmon, mackerel, sardines, tuna, flaxseed oil, and ground flaxseeds. Aim to eat fish at least 2 times each week. Check food labels carefully to identify foods with trans fats or high amounts of saturated fat. Limit saturated fats. These are found in animal products, such as meats, butter, and cream. Plant sources of saturated fats include palm oil, palm kernel oil, and coconut oil. Avoid foods with partially hydrogenated oils in them. These contain trans fats. Examples are stick margarine, some tub margarines, cookies, crackers, and other baked goods. Avoid fried foods. General information Eat more home-cooked food and less restaurant, buffet, and fast food. Limit or avoid alcohol. Limit foods that are high in added sugar and simple starches such as foods made using white refined flour (white breads, pastries, sweets). Lose weight if you are overweight. Losing just  5-10% of your body weight can help your overall health and prevent diseases such as diabetes and heart disease. Monitor your sodium intake, especially if you have high blood pressure. Talk with your health care provider about your sodium intake. Try to incorporate more vegetarian meals weekly. What foods should I  eat? Fruits All fresh, canned (in natural juice), or frozen fruits. Vegetables Fresh or frozen vegetables (raw, steamed, roasted, or grilled). Green salads. Grains Most grains. Choose whole wheat and whole grains most of the time. Rice and pasta, including brown rice and pastas made with whole wheat. Meats and other proteins Lean, well-trimmed beef, veal, pork, and lamb. Chicken and turkey without skin. All fish and shellfish. Wild duck, rabbit, pheasant, and venison. Egg whites or low-cholesterol egg substitutes. Dried beans, peas, lentils, and tofu. Seeds and most nuts. Dairy Low-fat or nonfat cheeses, including ricotta and mozzarella. Skim or 1% milk (liquid, powdered, or evaporated). Buttermilk made with low-fat milk. Nonfat or low-fat yogurt. Fats and oils Non-hydrogenated (trans-free) margarines. Vegetable oils, including soybean, sesame, sunflower, olive, avocado, peanut, safflower, corn, canola, and cottonseed. Salad dressings or mayonnaise made with a vegetable oil. Beverages Water (mineral or sparkling). Coffee and tea. Unsweetened ice tea. Diet beverages. Sweets and desserts Sherbet, gelatin, and fruit ice. Small amounts of dark chocolate. Limit all sweets and desserts. Seasonings and condiments All seasonings and condiments. The items listed above may not be a complete list of foods and beverages you can eat. Contact a dietitian for more options. What foods should I avoid? Fruits Canned fruit in heavy syrup. Fruit in cream or butter sauce. Fried fruit. Limit coconut. Vegetables Vegetables cooked in cheese, cream, or butter sauce. Fried vegetables. Grains Breads made with saturated or trans fats, oils, or whole milk. Croissants. Sweet rolls. Donuts. High-fat crackers, such as cheese crackers and chips. Meats and other proteins Fatty meats, such as hot dogs, ribs, sausage, bacon, rib-eye roast or steak. High-fat deli meats, such as salami and bologna. Caviar. Domestic duck and  goose. Organ meats, such as liver. Dairy Cream, sour cream, cream cheese, and creamed cottage cheese. Whole-milk cheeses. Whole or 2% milk (liquid, evaporated, or condensed). Whole buttermilk. Cream sauce or high-fat cheese sauce. Whole-milk yogurt. Fats and oils Meat fat, or shortening. Cocoa butter, hydrogenated oils, palm oil, coconut oil, palm kernel oil. Solid fats and shortenings, including bacon fat, salt pork, lard, and butter. Nondairy cream substitutes. Salad dressings with cheese or sour cream. Beverages Regular sodas and any drinks with added sugar. Sweets and desserts Frosting. Pudding. Cookies. Cakes. Pies. Milk chocolate or white chocolate. Buttered syrups. Full-fat ice cream or ice cream drinks. The items listed above may not be a complete list of foods and beverages to avoid. Contact a dietitian for more information. Summary Heart-healthy meal planning includes limiting unhealthy fats, increasing healthy fats, limiting salt (sodium) intake and making other diet and lifestyle changes. Lose weight if you are overweight. Losing just 5-10% of your body weight can help your overall health and prevent diseases such as diabetes and heart disease. Focus on eating a balance of foods, including fruits and vegetables, low-fat or nonfat dairy, lean protein, nuts and legumes, whole grains, and heart-healthy oils and fats. This information is not intended to replace advice given to you by your health care provider. Make sure you discuss any questions you have with your health care provider. Document Revised: 08/01/2021 Document Reviewed: 08/01/2021 Elsevier Patient Education  2024 [employer].

## 2023-08-14 ENCOUNTER — Ambulatory Visit (INDEPENDENT_AMBULATORY_CARE_PROVIDER_SITE_OTHER): Payer: Medicare Other | Admitting: Nurse Practitioner

## 2023-08-14 ENCOUNTER — Encounter: Payer: Self-pay | Admitting: Nurse Practitioner

## 2023-08-14 VITALS — BP 128/61 | HR 82 | Temp 98.5°F | Ht 64.0 in | Wt 171.4 lb

## 2023-08-14 DIAGNOSIS — J432 Centrilobular emphysema: Secondary | ICD-10-CM

## 2023-08-14 DIAGNOSIS — I25118 Atherosclerotic heart disease of native coronary artery with other forms of angina pectoris: Secondary | ICD-10-CM

## 2023-08-14 DIAGNOSIS — E7800 Pure hypercholesterolemia, unspecified: Secondary | ICD-10-CM

## 2023-08-14 DIAGNOSIS — F0390 Unspecified dementia without behavioral disturbance: Secondary | ICD-10-CM | POA: Diagnosis not present

## 2023-08-14 DIAGNOSIS — N138 Other obstructive and reflux uropathy: Secondary | ICD-10-CM

## 2023-08-14 DIAGNOSIS — N401 Enlarged prostate with lower urinary tract symptoms: Secondary | ICD-10-CM

## 2023-08-14 DIAGNOSIS — I5022 Chronic systolic (congestive) heart failure: Secondary | ICD-10-CM

## 2023-08-14 DIAGNOSIS — F324 Major depressive disorder, single episode, in partial remission: Secondary | ICD-10-CM

## 2023-08-14 DIAGNOSIS — E559 Vitamin D deficiency, unspecified: Secondary | ICD-10-CM

## 2023-08-14 DIAGNOSIS — E538 Deficiency of other specified B group vitamins: Secondary | ICD-10-CM

## 2023-08-14 DIAGNOSIS — F17220 Nicotine dependence, chewing tobacco, uncomplicated: Secondary | ICD-10-CM

## 2023-08-14 DIAGNOSIS — R7301 Impaired fasting glucose: Secondary | ICD-10-CM

## 2023-08-14 DIAGNOSIS — I10 Essential (primary) hypertension: Secondary | ICD-10-CM

## 2023-08-14 MED ORDER — VITAMIN D3 1.25 MG (50000 UT) PO CAPS: 50000.0000 [IU] | ORAL_CAPSULE | ORAL | 4 refills | Status: DC

## 2023-08-14 MED ORDER — NITROGLYCERIN 0.4 MG SL SUBL: 0.4000 mg | SUBLINGUAL_TABLET | SUBLINGUAL | 1 refills | Status: DC | PRN

## 2023-08-14 MED ORDER — ATORVASTATIN CALCIUM 40 MG PO TABS: 40.0000 mg | ORAL_TABLET | Freq: Every day | ORAL | 4 refills | Status: DC

## 2023-08-14 MED ORDER — FUROSEMIDE 40 MG PO TABS: 40.0000 mg | ORAL_TABLET | Freq: Every day | ORAL | 4 refills | Status: DC

## 2023-08-14 MED ORDER — METOPROLOL SUCCINATE ER 25 MG PO TB24: 25.0000 mg | ORAL_TABLET | Freq: Every day | ORAL | 4 refills | Status: DC

## 2023-08-14 MED ORDER — ALBUTEROL SULFATE HFA 108 (90 BASE) MCG/ACT IN AERS: 2.0000 | INHALATION_SPRAY | Freq: Four times a day (QID) | RESPIRATORY_TRACT | 2 refills | Status: DC | PRN

## 2023-08-14 MED ORDER — NICOTINE 21 MG/24HR TD PT24: 21.0000 mg | MEDICATED_PATCH | Freq: Every day | TRANSDERMAL | 0 refills | Status: DC

## 2023-08-14 MED ORDER — ANORO ELLIPTA 62.5-25 MCG/ACT IN AEPB: 1.0000 | INHALATION_SPRAY | Freq: Every day | RESPIRATORY_TRACT | 4 refills | Status: DC

## 2023-08-14 MED ORDER — RIVASTIGMINE TARTRATE 3 MG PO CAPS: 3.0000 mg | ORAL_CAPSULE | Freq: Two times a day (BID) | ORAL | 4 refills | Status: DC

## 2023-08-14 NOTE — Assessment & Plan Note (Signed)
A1c last check 5.8%, recheck today and initiate medication as needed.

## 2023-08-14 NOTE — Assessment & Plan Note (Signed)
 Chronic, ongoing.  Continue current medication regimen and adjust as needed.  Lipid panel today.  Sister is main caregiver and patient not consistent with medications, ?some educational barriers.  ?need for pill packs, new referral to PharmD to work on this.

## 2023-08-14 NOTE — Assessment & Plan Note (Signed)
Ongoing.  Recheck level today and recommend continue supplement.

## 2023-08-14 NOTE — Assessment & Plan Note (Signed)
 Chronic, ongoing in daily smoker.  Recommend complete cessation of smoking.  Was using inhalers consistently before he ran out and has not used in awhile per his sister's report.  Placed new referral to PharmD to assist on medication regimen. Discussed importance of using Anoro daily as is preventative to help with symptoms and to use Albuterol  only as needed, educated sister and him on this.  Would benefit lung CT cancer screening, but refuses at this time.  Nicotine  patches sent in.  - Concern currently for PNA vs HF exacerbation.  Unable to go for imaging.  Will start Augmentin  BID for 7 days and check labs today.  May benefit pulmonary referral in future.

## 2023-08-14 NOTE — Assessment & Plan Note (Signed)
Will check PSA today 

## 2023-08-14 NOTE — Assessment & Plan Note (Addendum)
 Chronic, ongoing.  Continue to collaborate with cardiology and discussed with his sister and him importance of adherence to regimen -- provided number to sister as needs follow-up ASAP wit Dr. Florencio.  Sister is main caregiver and patient not consistent with medications, ?some educational barriers.  ?need for pill packs, new referral to PharmD to work on this.

## 2023-08-14 NOTE — Assessment & Plan Note (Signed)
 Chronic, ongoing.  BP at goal today. Sister is main caregiver and patient not consistent with medications, ?some educational barriers.  ?need for pill packs, new referral to PharmD to work on this.  Recommend he monitor BP at least a few mornings a week at home and document.  DASH diet at home.  Continue current medication regimen and adjust as needed.  Labs today: CBC, CMP, TSH.

## 2023-08-14 NOTE — Assessment & Plan Note (Addendum)
 Chronic, ongoing.  Concern for fluid overload as not taking medications consistently  Sister is main caregiver.  ?need for pill packs, will place new PharmD referral and work with them on this.  Refills sent in.  Discussed at length with his sister and him importance of consistently taking medications to avoid exacerbations and hospitalization. Check BNP today Recommend: - Increase Lasix  to 80 MG daily for 5 days, wrote down for sister and refills sent. - Restart Metoprolol , has not taken in some time. - New referral to HF clinic placed, discussed with his sister importance of visits with them. - Reminded to call for an overnight weight gain of >2 pounds or a weekly weight gain of >5 pounds - No adding salt to food and read food labels. Reviewed the importance of keeping daily sodium intake to 2000mg  daily.  - Avoid Ibuprofen  products

## 2023-08-14 NOTE — Assessment & Plan Note (Signed)
Noted past labs, to continue supplement.  Recheck today.

## 2023-08-14 NOTE — Assessment & Plan Note (Signed)
Chronic, progressive.  Continue Exelon per neurology and discussed with his sister need to schedule follow-up with them to ensure ongoing continuity of care.  Provided number to sister, is aware no referral is needed at this time.

## 2023-08-14 NOTE — Assessment & Plan Note (Addendum)
Sister wants him to start patches.  Sent in 21 MG patches to start for 6 weeks and will adjust dose if beneficial and tolerating.  Recommend complete cessation of smoking.

## 2023-08-14 NOTE — Progress Notes (Signed)
 BP 128/61   Pulse 82   Temp 98.5 F (36.9 C) (Oral)   Ht 5' 4 (1.626 m)   Wt 171 lb 6.4 oz (77.7 kg)   SpO2 92%   BMI 29.42 kg/m    Subjective:    Patient ID: Blake [name], male    DOB: 04-06-1956, 68 y.o.   MRN: [id number]  HPI: Sascha Gonnella is a 68 y.o. male  Chief Complaint  Patient presents with   COPD   Depression   Congestive Heart Failure   Memory Loss   Hyperlipidemia   Hypertension   Sister (caregiver) at bedside to assist with HPI, he lives with her. Last visit October 2024, missed follow-up visits since this time.  HYPERTENSION / HYPERLIPIDEMIA/HF To be taking Metoprolol , Lisinopril , Lasix .  Last saw cardiology, Dr. Florencio, on 11/27/22 when they cleared him for hernia surgery + increased his Lisinopril  dose.  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 Has not seen HF Clinic since November 2022.  His sister reports he is not sleeping well at night because has trouble breathing when lies down.  Was out of Lasix  40 MG for 1 1/2 weeks and was given 20 MG by caregiver at time which was leftover from past.  Caregiver recently had knee surgery and was unable to take to cardiology or HF clinic recently, have highly recommended return. Satisfied with current treatment? yes Duration of hypertension: chronic BP monitoring frequency: not checking BP range:  BP medication side effects: no Duration of hyperlipidemia: chronic Cholesterol medication side effects: no Cholesterol supplements: none Medication compliance: poor compliance Aspirin : yes Recent stressors: no Recurrent headaches: no Visual changes: no Palpitations: no Dyspnea: yes with lying down Chest pain: no Lower extremity edema: yes Dizzy/lightheaded: sometimes  COPD Continues to smoke 1 1/2 PPD.  Has smoked since he was 38 to 68 years old.  Has been out of Anoro for awhile and not using.  No Albuterol  use.  Sister reports he recently got over a cold.  She would like nicotine  patches  for him. COPD status: uncontrolled Satisfied with current treatment?: yes Oxygen use: no Dyspnea frequency: yes  Cough frequency: yes Rescue inhaler frequency:  2 times a day Limitation of activity: no Productive cough: no Last Spirometry: unknown Pneumovax: Up To Date Influenza:  refused  PREDIABETES October 2024 was 5.8%. Polydipsia/polyuria: no Visual disturbance: no Chest pain: no Paresthesias: no  DEPRESSION & MEMORY CHANGES No current mood medications. Has not seen neurology since 10/14/2021, when was started on Exelon  for dementia, 3 MG BID, and they stopped Aricept as it was causing muscle cramps.  Family history of uncle with dementia.  Low levels Vitamin D  and B12 recent labs, is taking supplements. Mood status: stable Depressed mood: occasional Anxious mood: occasional Anhedonia: no Significant weight loss or gain: no Insomnia: yes hard to fall asleep Fatigue: yes Feelings of worthlessness or guilt: no Impaired concentration/indecisiveness: no Suicidal ideations: no Hopelessness: no Crying spells: no    08/14/2023    5:07 PM 05/04/2023    3:25 PM 11/15/2022   11:33 AM 02/16/2022    9:12 AM 09/04/2018    1:55 PM  Depression screen PHQ 2/9  Decreased Interest 1 1 0 0 0  Down, Depressed, Hopeless 1 1 0 0 0  PHQ - 2 Score 2 2 0 0 0  Altered sleeping 1 0 0    Tired, decreased energy 2 1 0    Change in appetite 0 0  0    Feeling bad or failure about yourself  0 0 0    Trouble concentrating 0  0    Moving slowly or fidgety/restless  0 0    Suicidal thoughts 0 0 0    PHQ-9 Score 5 3 0    Difficult doing work/chores Not difficult at all  Not difficult at all         08/14/2023    5:08 PM 05/04/2023    3:26 PM 11/15/2022   11:33 AM  GAD 7 : Generalized Anxiety Score  Nervous, Anxious, on Edge 0 1 0  Control/stop worrying 0 0 0  Worry too much - different things 0 0 0  Trouble relaxing 0 1 0  Restless 0 0 0  Easily annoyed or irritable 0 0 0  Afraid - awful might  happen 0 0 0  Total GAD 7 Score 0 2 0  Anxiety Difficulty Not difficult at all Not difficult at all Not difficult at all      05/04/2023    3:27 PM 02/16/2022    9:08 AM  6CIT Screen  What Year? 4 points 4 points  What month? 0 points 3 points  What time? 3 points 0 points  Count back from 20 0 points 4 points  Months in reverse 2 points 4 points  Repeat phrase 6 points 10 points  Total Score 15 points 25 points   Relevant past medical, surgical, family and social history reviewed and updated as indicated. Interim medical history since our last visit reviewed. Allergies and medications reviewed and updated.  Review of Systems  Constitutional:  Positive for fatigue. Negative for activity change, appetite change, diaphoresis and fever.  Respiratory:  Positive for cough and shortness of breath. Negative for chest tightness and wheezing.   Cardiovascular:  Positive for leg swelling. Negative for chest pain and palpitations.  Gastrointestinal: Negative.   Endocrine: Negative for cold intolerance, heat intolerance, polydipsia, polyphagia and polyuria.  Neurological:  Negative for dizziness, syncope, weakness, light-headedness, numbness and headaches.       Memory changes  Psychiatric/Behavioral:  Positive for sleep disturbance. Negative for decreased concentration, self-injury and suicidal ideas. The patient is nervous/anxious.    Per HPI unless specifically indicated above     Objective:    BP 128/61   Pulse 82   Temp 98.5 F (36.9 C) (Oral)   Ht 5' 4 (1.626 m)   Wt 171 lb 6.4 oz (77.7 kg)   SpO2 92%   BMI 29.42 kg/m   Wt Readings from Last 3 Encounters:  08/14/23 171 lb 6.4 oz (77.7 kg)  05/04/23 159 lb (72.1 kg)  11/15/22 159 lb 12.8 oz (72.5 kg)    Physical Exam Vitals and nursing note reviewed. Exam conducted with a chaperone present.  Constitutional:      General: He is awake. He is not in acute distress.    Appearance: He is well-developed and well-groomed. He is  not ill-appearing or toxic-appearing.  HENT:     Head: Normocephalic.     Right Ear: Hearing and external ear normal.     Left Ear: Hearing and external ear normal.  Eyes:     General: Lids are normal.     Extraocular Movements: Extraocular movements intact.     Conjunctiva/sclera: Conjunctivae normal.  Neck:     Thyroid : No thyromegaly.     Vascular: No carotid bruit or JVD.  Cardiovascular:     Rate and Rhythm: Normal rate and regular rhythm.  Heart sounds: Normal heart sounds. No murmur heard.    No gallop.  Pulmonary:     Effort: Pulmonary effort is normal. No accessory muscle usage or respiratory distress.     Breath sounds: Wheezing and rales present. No decreased breath sounds or rhonchi.     Comments: Expiratory wheezes throughout noted and rales to lower bases.  No coughing today. Abdominal:     General: Bowel sounds are normal. There is no distension.     Palpations: Abdomen is soft. There is no fluid wave or hepatomegaly.     Tenderness: There is no abdominal tenderness.  Musculoskeletal:     Cervical back: Full passive range of motion without pain.     Right lower leg: 1+ Pitting Edema present.     Left lower leg: 1+ Pitting Edema present.  Lymphadenopathy:     Cervical: No cervical adenopathy.  Skin:    General: Skin is warm.     Capillary Refill: Capillary refill takes less than 2 seconds.  Neurological:     Mental Status: He is alert.     Cranial Nerves: Cranial nerves 2-12 are intact.     Deep Tendon Reflexes: Reflexes are normal and symmetric.     Reflex Scores:      Brachioradialis reflexes are 2+ on the right side and 2+ on the left side.      Patellar reflexes are 2+ on the right side and 2+ on the left side.    Comments: Reports it is 2008 and Friday.  Is able to state month.  Able to state city and state.  Not able to report President.  Pleasantly confused.  Psychiatric:        Attention and Perception: Attention normal.        Mood and Affect: Mood  normal.        Speech: Speech normal.        Behavior: Behavior normal. Behavior is cooperative.        Cognition and Memory: Memory is impaired.    Results for orders placed or performed in visit on 05/04/23  CBC with Differential/Platelet   Collection Time: 05/04/23  3:32 PM  Result Value Ref Range   WBC 3.9 3.4 - 10.8 x10E3/uL   RBC 4.22 4.14 - 5.80 x10E6/uL   Hemoglobin 13.1 13.0 - 17.7 g/dL   Hematocrit 60.3 62.4 - 51.0 %   MCV 94 79 - 97 fL   MCH 31.0 26.6 - 33.0 pg   MCHC 33.1 31.5 - 35.7 g/dL   RDW 88.3 88.3 - 84.5 %   Platelets 191 150 - 450 x10E3/uL   Neutrophils 56 Not Estab. %   Lymphs 29 Not Estab. %   Monocytes 11 Not Estab. %   Eos 2 Not Estab. %   Basos 1 Not Estab. %   Neutrophils Absolute 2.2 1.4 - 7.0 x10E3/uL   Lymphocytes Absolute 1.1 0.7 - 3.1 x10E3/uL   Monocytes Absolute 0.4 0.1 - 0.9 x10E3/uL   EOS (ABSOLUTE) 0.1 0.0 - 0.4 x10E3/uL   Basophils Absolute 0.0 0.0 - 0.2 x10E3/uL   Immature Granulocytes 1 Not Estab. %   Immature Grans (Abs) 0.0 0.0 - 0.1 x10E3/uL  Comprehensive metabolic panel   Collection Time: 05/04/23  3:32 PM  Result Value Ref Range   Glucose 117 (H) 70 - 99 mg/dL   BUN 15 8 - 27 mg/dL   Creatinine, Ser 9.04 0.76 - 1.27 mg/dL   eGFR 87 >40 fO/fpw/8.26   BUN/Creatinine Ratio 16  10 - 24   Sodium 142 134 - 144 mmol/L   Potassium 4.2 3.5 - 5.2 mmol/L   Chloride 106 96 - 106 mmol/L   CO2 24 20 - 29 mmol/L   Calcium  9.5 8.6 - 10.2 mg/dL   Total Protein 7.5 6.0 - 8.5 g/dL   Albumin 3.7 (L) 3.9 - 4.9 g/dL   Globulin, Total 3.8 1.5 - 4.5 g/dL   Bilirubin Total 0.5 0.0 - 1.2 mg/dL   Alkaline Phosphatase 103 44 - 121 IU/L   AST 38 0 - 40 IU/L   ALT 29 0 - 44 IU/L  Lipid Panel w/o Chol/HDL Ratio   Collection Time: 05/04/23  3:32 PM  Result Value Ref Range   Cholesterol, Total 120 100 - 199 mg/dL   Triglycerides 87 0 - 149 mg/dL   HDL 39 (L) >60 mg/dL   VLDL Cholesterol Cal 17 5 - 40 mg/dL   LDL Chol Calc (NIH) 64 0 - 99 mg/dL   VITAMIN D  25 Hydroxy (Vit-D Deficiency, Fractures)   Collection Time: 05/04/23  3:32 PM  Result Value Ref Range   Vit D, 25-Hydroxy 13.6 (L) 30.0 - 100.0 ng/mL  Vitamin B12   Collection Time: 05/04/23  3:32 PM  Result Value Ref Range   Vitamin B-12 274 232 - 1,245 pg/mL  HgB A1c   Collection Time: 05/04/23  3:32 PM  Result Value Ref Range   Hgb A1c MFr Bld 5.8 (H) 4.8 - 5.6 %   Est. average glucose Bld gHb Est-mCnc 120 mg/dL      Assessment & Plan:   Problem List Items Addressed This Visit       Cardiovascular and Mediastinum   Chronic systolic CHF (congestive heart failure) (HCC) - Primary   Chronic, ongoing.  Concern for fluid overload as not taking medications consistently  Sister is main caregiver.  ?need for pill packs, will place new PharmD referral and work with them on this.  Refills sent in.  Discussed at length with his sister and him importance of consistently taking medications to avoid exacerbations and hospitalization. Check BNP today Recommend: - Increase Lasix  to 80 MG daily for 5 days, wrote down for sister and refills sent. - Restart Metoprolol , has not taken in some time. - New referral to HF clinic placed, discussed with his sister importance of visits with them. - Reminded to call for an overnight weight gain of >2 pounds or a weekly weight gain of >5 pounds - No adding salt to food and read food labels. Reviewed the importance of keeping daily sodium intake to 2000mg  daily.  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  Continue to collaborate with cardiology and discussed with his sister and him importance of adherence to regimen -- provided number to sister as needs follow-up ASAP wit Dr. Florencio.  Sister is main caregiver and patient not consistent with medications, ?some educational barriers.  ?need for pill packs, new referral to PharmD to work on this.  Relevant Medications   furosemide  (LASIX ) 40 MG tablet   nitroGLYCERIN  (NITROSTAT ) 0.4 MG SL tablet   metoprolol  succinate (TOPROL -XL) 25 MG 24 hr tablet   atorvastatin  (LIPITOR) 40 MG tablet   Other Relevant Orders   Comprehensive metabolic panel   Lipid Panel w/o Chol/HDL Ratio   AMB Referral VBCI Care Management   Essential (primary) hypertension   Chronic, ongoing.  BP at goal today. Sister is main caregiver and patient not consistent with medications, ?some educational barriers.  ?need for pill packs, new referral to PharmD to work on this.  Recommend he monitor BP at least a few mornings a week at home and document.  DASH diet at home.  Continue current medication regimen and adjust as needed.  Labs today: CBC, CMP, TSH.        Relevant Medications   furosemide  (LASIX ) 40 MG tablet   nitroGLYCERIN  (NITROSTAT ) 0.4 MG SL tablet   metoprolol  succinate (TOPROL -XL) 25 MG 24 hr tablet   atorvastatin  (LIPITOR) 40 MG tablet   Other Relevant Orders   Comprehensive metabolic panel   TSH   AMB Referral VBCI Care Management     Respiratory   Centrilobular emphysema (HCC)   Chronic, ongoing in daily smoker.  Recommend complete cessation of smoking.  Was using inhalers consistently before he ran out and has not used in awhile per his sister's report.  Placed new referral to PharmD to assist on medication regimen. Discussed importance of using Anoro daily as is preventative to help with symptoms and to use Albuterol  only as needed, educated sister and him on this.  Would benefit lung CT cancer screening, but refuses at this time.   Nicotine  patches sent in.  - Concern currently for PNA vs HF exacerbation.  Unable to go for imaging.  Will start Augmentin  BID for 7 days and check labs today.  May benefit pulmonary referral in future.      Relevant Medications   umeclidinium-vilanterol (ANORO ELLIPTA ) 62.5-25 MCG/ACT AEPB   albuterol  (VENTOLIN  HFA) 108 (90 Base) MCG/ACT inhaler   nicotine  (NICODERM CQ  - DOSED IN MG/24 HOURS) 21 mg/24hr patch   Other Relevant Orders   CBC with Differential/Platelet   AMB Referral VBCI Care Management     Endocrine   IFG (impaired fasting glucose)   A1c last check 5.8%, recheck today and initiate medication as needed.      Relevant Orders   HgB A1c     Nervous and Auditory   Dementia without behavioral disturbance (HCC)   Chronic, progressive.  Continue Exelon  per neurology and discussed with his sister need to schedule follow-up with them to ensure ongoing continuity of care.  Provided number to sister, is aware no referral is needed at this time.      Relevant Medications   rivastigmine  (EXELON ) 3 MG capsule   nicotine  (NICODERM CQ  - DOSED IN MG/24 HOURS) 21 mg/24hr patch   Other Relevant Orders   AMB Referral VBCI Care Management     Genitourinary   Benign prostatic hyperplasia with urinary obstruction   Will check PSA today.      Relevant Orders   PSA     Other   B12 deficiency   Noted past labs, to continue supplement.  Recheck today.      Relevant Orders   Vitamin B12   Clinical depression   Relevant Orders   AMB Referral VBCI Care Management   Hypercholesteremia   Chronic, ongoing.  Continue current medication regimen and adjust as needed.  Lipid panel today.  Sister is main caregiver and patient not consistent with medications, ?some educational barriers.  ?need for pill packs, new referral to PharmD to work on this.       Relevant Medications   furosemide  (LASIX ) 40 MG tablet   nitroGLYCERIN  (NITROSTAT ) 0.4 MG SL tablet   metoprolol  succinate  (TOPROL -XL) 25 MG 24 hr tablet   atorvastatin  (LIPITOR) 40 MG tablet   Other Relevant Orders   Comprehensive metabolic panel   Lipid Panel w/o Chol/HDL Ratio   Nicotine  dependence, chewing tobacco, uncomplicated   Sister wants him to start patches.  Sent in 21 MG patches to start for 6 weeks and will adjust dose if beneficial and tolerating.  Recommend complete cessation of smoking.      Relevant Medications   nicotine  (NICODERM CQ  - DOSED IN MG/24 HOURS) 21 mg/24hr patch   Vitamin D  deficiency   Ongoing.  Recheck level today and recommend continue supplement.      Relevant Orders   VITAMIN D  25 Hydroxy (Vit-D Deficiency, Fractures)       Follow up plan: Return in about 1 week (around 08/21/2023) for HF and COPD.

## 2023-08-15 ENCOUNTER — Other Ambulatory Visit: Payer: Self-pay | Admitting: Nurse Practitioner

## 2023-08-15 ENCOUNTER — Telehealth: Payer: Self-pay

## 2023-08-15 LAB — CBC WITH DIFFERENTIAL/PLATELET
Basophils Absolute: 0 10*3/uL (ref 0.0–0.2)
Basos: 1 %
EOS (ABSOLUTE): 0.1 10*3/uL (ref 0.0–0.4)
Eos: 1 %
Hematocrit: 34.3 % — ABNORMAL LOW (ref 37.5–51.0)
Hemoglobin: 11.2 g/dL — ABNORMAL LOW (ref 13.0–17.7)
Immature Grans (Abs): 0 10*3/uL (ref 0.0–0.1)
Immature Granulocytes: 0 %
Lymphocytes Absolute: 1 10*3/uL (ref 0.7–3.1)
Lymphs: 18 %
MCH: 30.5 pg (ref 26.6–33.0)
MCHC: 32.7 g/dL (ref 31.5–35.7)
MCV: 94 fL (ref 79–97)
Monocytes Absolute: 0.6 10*3/uL (ref 0.1–0.9)
Monocytes: 11 %
Neutrophils Absolute: 3.9 10*3/uL (ref 1.4–7.0)
Neutrophils: 69 %
Platelets: 156 10*3/uL (ref 150–450)
RBC: 3.67 x10E6/uL — ABNORMAL LOW (ref 4.14–5.80)
RDW: 11.5 % — ABNORMAL LOW (ref 11.6–15.4)
WBC: 5.7 10*3/uL (ref 3.4–10.8)

## 2023-08-15 LAB — COMPREHENSIVE METABOLIC PANEL
ALT: 19 [IU]/L (ref 0–44)
AST: 39 [IU]/L (ref 0–40)
Albumin: 3.4 g/dL — ABNORMAL LOW (ref 3.9–4.9)
Alkaline Phosphatase: 101 [IU]/L (ref 44–121)
BUN/Creatinine Ratio: 19 (ref 10–24)
BUN: 22 mg/dL (ref 8–27)
Bilirubin Total: 0.7 mg/dL (ref 0.0–1.2)
CO2: 27 mmol/L (ref 20–29)
Calcium: 9.2 mg/dL (ref 8.6–10.2)
Chloride: 102 mmol/L (ref 96–106)
Creatinine, Ser: 1.13 mg/dL (ref 0.76–1.27)
Globulin, Total: 3.6 g/dL (ref 1.5–4.5)
Glucose: 133 mg/dL — ABNORMAL HIGH (ref 70–99)
Potassium: 3.4 mmol/L — ABNORMAL LOW (ref 3.5–5.2)
Sodium: 142 mmol/L (ref 134–144)
Total Protein: 7 g/dL (ref 6.0–8.5)
eGFR: 70 mL/min/{1.73_m2} (ref >59)

## 2023-08-15 LAB — LIPID PANEL W/O CHOL/HDL RATIO
Cholesterol, Total: 116 mg/dL (ref 100–199)
HDL: 40 mg/dL (ref >39)
LDL Chol Calc (NIH): 64 mg/dL (ref 0–99)
Triglycerides: 50 mg/dL (ref 0–149)
VLDL Cholesterol Cal: 12 mg/dL (ref 5–40)

## 2023-08-15 LAB — VITAMIN D 25 HYDROXY (VIT D DEFICIENCY, FRACTURES): Vit D, 25-Hydroxy: 59.4 ng/mL (ref 30.0–100.0)

## 2023-08-15 LAB — HEMOGLOBIN A1C
Est. average glucose Bld gHb Est-mCnc: 128 mg/dL
Hgb A1c MFr Bld: 6.1 % — ABNORMAL HIGH (ref 4.8–5.6)

## 2023-08-15 LAB — PSA: Prostate Specific Ag, Serum: 1.8 ng/mL (ref 0.0–4.0)

## 2023-08-15 LAB — TSH: TSH: 1.65 u[IU]/mL (ref 0.450–4.500)

## 2023-08-15 LAB — VITAMIN B12: Vitamin B-12: 382 pg/mL (ref 232–1245)

## 2023-08-15 LAB — BRAIN NATRIURETIC PEPTIDE: BNP: 1043.7 pg/mL — ABNORMAL HIGH (ref 0.0–100.0)

## 2023-08-15 MED ORDER — POTASSIUM CHLORIDE CRYS ER 10 MEQ PO TBCR: 10.0000 meq | EXTENDED_RELEASE_TABLET | Freq: Every day | ORAL | 0 refills | Status: DC

## 2023-08-15 NOTE — Progress Notes (Signed)
 Good morning amazing crew, please let Bralen's sister know his labs have returned: - CBC is showing some mild anemia which is new for him.  Please ensure he is getting lots of deep leafy greens in diet and a daily multivitamin may offer benefit. - Kidney and liver function are normal, but potassium is a little low.  Please restart medication as we discussed, plus I have sent in 5 days of potassium for him to take and we will recheck at next visit. - A1c, diabetes testing, has trended up a little from 5.8% to 6.1% -- still prediabetes.  Focus heavily on diet, less sugar products. - B12 remains a little on low side, please ensure to take supplement I sent in for this. - My major concern is his BNP, which looks at heart failure, is very elevated as I suspected it would be.  Please increase the Lasix  like we discussed to get some of the fluid off, but if any worsening symptoms go right to the ER as may need IV treatment.  Also please ensure to schedule with heart failure clinic.  I sent referral and they will call.  This is very important + call heart doctor and schedule with them.  I placed phone number on the wrap up sheet.  Any questions? Keep being stellar!!  Thank you for allowing me to participate in your care.  I appreciate you. Kindest regards, Safwan Tomei

## 2023-08-15 NOTE — Progress Notes (Signed)
 Care Guide Pharmacy Note  08/15/2023 Name: Blake [name] MRN: [id number] DOB: 04-01-1956  Referred By: Valerio Melanie DASEN, NP Reason for referral: Care Coordination (Outreach to schedule with Pharm d )   Blake [name] is a 68 y.o. year old male who is a primary care patient of Cannady, Jolene T, NP.  Blake [name] was referred to the pharmacist for assistance related to: DMII  An unsuccessful telephone outreach was attempted today to contact the patient who was referred to the pharmacy team for assistance with medication management. Additional attempts will be made to contact the patient.  Jeoffrey Buffalo , RMA     [facility]  [facility], [facility] Guide  Direct Dial: [phone]  Website: [email or web address]

## 2023-08-17 NOTE — Progress Notes (Signed)
 Care Guide Pharmacy Note  08/17/2023 Name: Blake [name] MRN: [id number] DOB: 04-11-1956  Referred By: Valerio Melanie DASEN, NP Reason for referral: Care Coordination (Outreach to schedule with Pharm d )   Blake [name] is a 68 y.o. year old male who is a primary care patient of Cannady, Jolene T, NP.  Blake [name] was referred to the pharmacist for assistance related to: DMII  A second unsuccessful telephone outreach was attempted today to contact the patient who was referred to the pharmacy team for assistance with medication assistance. Additional attempts will be made to contact the patient.  Jeoffrey Buffalo , RMA     [facility]  [facility], [facility] Guide  Direct Dial: [phone]  Website: [email or web address]

## 2023-08-22 ENCOUNTER — Ambulatory Visit: Payer: Medicare Other | Admitting: Nurse Practitioner

## 2023-08-22 NOTE — Progress Notes (Signed)
Care Guide Pharmacy Note  08/22/2023 Name: Blake [name] MRN: [id number] DOB: Sep 03, 1955  Referred By: Marjie Skiff, NP Reason for referral: Care Coordination (Outreach to schedule with Pharm d )   Blake [name] is a 68 y.o. year old male who is a primary care patient of Cannady, Dorie Rank, NP.  Blake [name] was referred to the pharmacist for assistance related to: DMII  Successful contact was made with the patient to discuss pharmacy services including being ready for the pharmacist to call at least 5 minutes before the scheduled appointment time and to have medication bottles and any blood pressure readings ready for review. The patient agreed to meet with the pharmacist via telephone visit on (date/time).08/27/2023  Penne Lash , RMA     [facility]  [facility], [facility] Guide  Direct Dial: [phone]  Website: [email or web address]

## 2023-08-26 NOTE — Patient Instructions (Incomplete)
 Heart Failure Action Plan A heart failure action plan helps you know what to do when you have symptoms of heart failure. Your action plan is a color-coded plan that lists the symptoms to watch for and indicates what actions to take. If you have symptoms in the green zone, you're doing well. If you have symptoms in the yellow zone, you're having problems. If you have symptoms in the red zone, you need medical care right away. Follow the plan that was created by you and your health care provider. Review your plan each time you visit your provider. Green zone These signs mean you're doing well and can continue what you're doing: You don't have new or worsening shortness of breath. You have very little swelling or no new swelling. Your weight is stable (no gain or loss). You have a normal activity level. You don't have chest pain or any other new symptoms. Yellow zone These signs and symptoms mean your condition may be getting worse and you should make some changes: You have trouble breathing when you're active. You have swelling in your feet or legs or have discomfort in your belly. You gain 2-3 lb (0.9-1.4 kg) in 24 hours, or 5 lb (2.3 kg) in a week. This amount may be more or less depending on your condition. You get tired easily. You have trouble sleeping. You have a dry cough. If you have any of these symptoms: Contact your provider within the next day. Your provider may adjust your medicines. Red zone These signs and symptoms mean you should get medical help right away: You have trouble breathing when resting or cannot lie flat and you need to raise your head to help you breathe. You have a dry cough that's getting worse. You have swelling or pain in your feet or legs or discomfort in your belly that's getting worse. You suddenly gain more than 2-3 lb (0.9-1.4 kg) in 24 hours, or more than 5 lb (2.3 kg) in a week. This amount may be more or less depending on your condition. You have  trouble staying awake or you feel confused. You don't have an appetite. You have worsening sadness or depression. These symptoms may be an emergency. Call 911 right away. Do not wait to see if the symptoms will go away. Do not drive yourself to the hospital. Follow these instructions at home: Take medicines only as told. Eat a heart-healthy diet. Work with a dietitian to create an eating plan that's best for you. Weigh yourself each day. Your target weight is __________ lb (__________ kg). Call your provider if you gain more than __________ lb (__________ kg) in 24 hours, or more than __________ lb (__________ kg) in a week. Health care provider name: _____________________________________________________ Health care provider phone number: _____________________________________________________ Where to find more information American Heart Association: heart.org This information is not intended to replace advice given to you by your health care provider. Make sure you discuss any questions you have with your health care provider. Document Revised: 02/08/2023 Document Reviewed: 02/08/2023 Elsevier Patient Education  2024 [employer].

## 2023-08-27 ENCOUNTER — Other Ambulatory Visit: Payer: Self-pay

## 2023-08-27 NOTE — Progress Notes (Unsigned)
   08/27/2023  Patient ID: Blake [name], male   DOB: 1956/07/09, 68 y.o.   MRN: [id number]  Out reach for scheduled telephone visit for medication review and to discuss pill packaging.  I was able to reach patient's sister, [name]); but she was on her way to take [name] to an appointment at the heart failure clinic.  Telephone visit has been rescheduled to Wednesday morning at 8:30 AM.  [name], PharmD, DPLA

## 2023-08-29 ENCOUNTER — Other Ambulatory Visit: Payer: Self-pay

## 2023-08-29 ENCOUNTER — Ambulatory Visit: Payer: Medicare Other | Admitting: Nurse Practitioner

## 2023-08-29 ENCOUNTER — Other Ambulatory Visit (HOSPITAL_COMMUNITY): Payer: Self-pay

## 2023-08-29 NOTE — Progress Notes (Signed)
08/29/2023 Name: Blake [name] MRN: [id number] DOB: 06/15/1956  Chief Complaint  Patient presents with   Medication Management   Blake [name] is a 68 y.o. year old male who presented for a telephone visit.   They were referred to the pharmacist by their PCP for assistance in managing medication access.   Subjective:  Care Team: Primary Care Provider: Marjie Skiff, NP ; Next Scheduled Visit: 09/04/2023  Medication Access/Adherence  Current Pharmacy:  [facility] [address] [place] - [address] [place] [address] Phone: [phone] Fax: [phone]  [id number] [place], [place] - [address] [place] [address] Phone: [phone] Fax: [phone]  [facility] - [facility] [address] [place] [address] Phone: [phone] Fax: [phone]  -Patient reports affordability concerns with their medications: No  -Patient reports access/transportation concerns to their pharmacy: No  -Patient reports adherence concerns with their medications:  Yes    Medication Management: Current adherence strategy: none- sisters assists with medications, and patient was previously enrolled in compliance packaging and home delivery through [facility].  However, due to a lapse in insurance coverage and inability of pharmacy to contact patient/caretaker, medications were only filled [facility] [place]. -Patient reports Fair adherence to medications -Patient reports the following barriers to adherence: forgets to take -Recent fill dates:  Albuterol inhaler 08/19/23 Potassium 09/05/23 (5 day regimen) Rivastigmine 3mg  90d supply 08/16/23 Furosemide 40mg  90d supply 08/09/23 -All other medications do not reflect being filled since May or June per insurance dispensing report  Objective: Lab Results  Component Value Date   HGBA1C 6.1 (H) 08/14/2023   Lab Results  Component Value Date    CREATININE 1.13 08/14/2023   BUN 22 08/14/2023   NA 142 08/14/2023   K 3.4 (L) 08/14/2023   CL 102 08/14/2023   CO2 27 08/14/2023   Lab Results  Component Value Date   CHOL 116 08/14/2023   HDL 40 08/14/2023   LDLCALC 64 08/14/2023   TRIG 50 08/14/2023   CHOLHDL 3.8 06/08/2019   Medications Reviewed Today     Reviewed by [name], [facility] (Pharmacist) on 08/29/23 at [phone]  Med List Status: <None>   Medication Order Taking? Sig Documenting Provider Last Dose Status Informant  albuterol (VENTOLIN HFA) 108 (90 Base) MCG/ACT inhaler [id number] Yes Inhale 2 puffs into the lungs every 6 (six) hours as needed for wheeze/shortness of breath. Aura Dials T, NP Taking Active   aspirin 81 MG chewable tablet [id number] Yes Chew 1 tablet (81 mg total) by mouth daily. Aura Dials T, NP Taking Active   atorvastatin (LIPITOR) 40 MG tablet [id number] Yes Take 1 tablet (40 mg total) by mouth daily. Aura Dials T, NP Taking Active   Cholecalciferol (VITAMIN D3) 1.25 MG (50000 UT) CAPS [id number] Yes Take 1 capsule (50,000 Units total) by mouth every 7 (seven) days. Aura Dials T, NP Taking Active   Cyanocobalamin (B-12) 1000 MCG TABS [id number] Yes Take 1 tablet (1,000 mcg total) by mouth daily. Aura Dials T, NP Taking Active   furosemide (LASIX) 40 MG tablet [id number] Yes Take 1 tablet (40 mg total) by mouth daily. Aura Dials T, NP Taking Active   lisinopril (ZESTRIL) 5 MG tablet [id number] Yes Take 1 tablet (5 mg total) by mouth daily. Aura Dials T, NP Taking Active   metoprolol succinate (TOPROL-XL) 25 MG 24 hr tablet [id number] Yes Take 1  tablet (25 mg total) by mouth daily. Aura Dials T, NP Taking Active   nicotine (NICODERM CQ - DOSED IN MG/24 HOURS) 21 mg/24hr patch [id number] Yes Place 1 patch (21 mg total) onto the skin daily. Aura Dials T, NP Taking Active   nitroGLYCERIN (NITROSTAT) 0.4 MG SL tablet [id number] Yes Place 1 tablet (0.4 mg total) under the  tongue every 5 (five) minutes as needed for chest pain. Reported on 09/29/2015 Aura Dials T, NP Taking Active   omeprazole (PRILOSEC) 20 MG capsule [id number] Yes Take 1 capsule (20 mg total) by mouth daily. Aura Dials T, NP Taking Active   potassium chloride (KLOR-CON M) 10 MEQ tablet [id number]  Take 1 tablet (10 mEq total) by mouth daily for 5 days. Aura Dials T, NP  Expired 08/20/23 2359   rivastigmine (EXELON) 3 MG capsule [id number] Yes Take 1 capsule (3 mg total) by mouth 2 (two) times daily. Aura Dials T, NP Taking Active   umeclidinium-vilanterol (ANORO ELLIPTA) 62.5-25 MCG/ACT AEPB [id number] Yes Inhale 1 puff into the lungs daily at 6 (six) AM. Aura Dials T, NP Taking Active            Assessment/Plan:   Medication Management: -Currently strategy insufficient to maintain appropriate adherence to prescribed medication regimen -Discussed collaboration with local pharmacies for adherence packaging. Reviewed local pharmacies with adherence packaging options. Patient/caretaker elects to resume compliance packaging and home delivery through [facility].  Medication list has been verified and is up to date.  Patient address, phone number, and insurance have also been verified.  Coordinating with pharmacy to transfer medications to [facility] for packaging and delivery.  Follow Up Plan: Will monitor progress to assist with adherence  [name], PharmD, DPLA

## 2023-08-31 ENCOUNTER — Other Ambulatory Visit: Payer: Self-pay

## 2023-09-01 NOTE — Patient Instructions (Incomplete)
Heart Failure: Eating Plan Heart failure is a long-term condition where the heart can't pump enough blood through the body. When this happens, parts of the body don't get the blood and oxygen they need. Living with heart failure can be hard. But a healthy lifestyle and choosing the right foods may help to improve your symptoms. If you have heart failure, your eating plan will include limiting the amount of salt, also called sodium, and unhealthy fats you eat. What are tips for following this plan? Reading food labels Check food labels for the amount of sodium per serving. Choose foods that have less than 140 mg (milligrams) of sodium in each serving. Check food labels for the number of calories per serving. This is important if you need to limit your daily calorie intake to lose weight. Check food labels for the serving size. If you eat more than one serving, you'll be eating more sodium and calories than what's listed on the label. Look for foods with the words "sodium-free," "very low sodium," or "low sodium" on the package. Foods labeled as "reduced sodium," "lightly salted," or "no salt added" may still have more sodium than what's recommended for you. Cooking Avoid adding salt when cooking. Before using any salt substitutes, talk with your health care provider or an expert in healthy eating called a dietitian. Season food with salt-free seasonings, spices, or herbs. Check the label of seasoning mixes to make sure they don't contain salt. Cook with heart-healthy oils, such as olive, canola, soybean, or sunflower oil. Do not fry foods. Cook foods using low-fat methods, like baking, boiling, grilling, and broiling. Limit unhealthy fats when cooking. To do this: Remove the skin from poultry, such as chicken. Remove all the fat you can see on meats. Skim the fat off from stews, soups, and gravies before serving them. Meal planning  Limit your intake of: Processed, canned, or prepackaged  foods. Foods that are high in trans fat, such as fried foods. Sweets, desserts, sugary drinks, and other foods with added sugar. Full-fat dairy products, such as whole milk. Eat a balanced diet. This may include: 4-5 servings of fruit each day and 4-5 servings of vegetables each day. At each meal, try to fill one-half of your plate with fruits and vegetables. Up to 6-8 servings of whole grains each day. Up to 2 servings of lean meat, poultry, or fish each day. One serving of meat is equal to 3 oz (85 g). This is about the same size as a deck of cards. 2 servings of low-fat dairy each day. Heart-healthy fats. Healthy fats called omega-3 fatty acids are a good choice. They're found in foods such as flaxseed and cold-water fish like sardines, salmon, and mackerel. Aim to eat 25-35 g (grams) of fiber a day. Foods that are high in fiber include apples, broccoli, carrots, beans, peas, and whole grains. Do not add salt or condiments that contain salt (such as soy sauce) to foods before eating. When eating at a restaurant, ask that your food be prepared with less salt or no salt, if possible. Try to eat 2 or more plant-based or meat-free meals each week. Cook more meals at home and eat less at restaurants, buffets, and fast food places. General information Do not eat more than 2,300 mg of sodium a day. The amount of sodium that's recommended for you may be lower, depending on your condition. Stay at a healthy weight as told. Ask your provider what a healthy weight is for you. Check  your weight every day. Work with your provider and dietitian to make a plan that will help you lose weight or stay at your current weight. Limit how much fluid you drink. Ask your provider or dietitian how much fluid you can have each day. Limit or avoid alcohol as told. Recommended foods Fruits All fresh, frozen, and canned fruits. Dried fruits, such as raisins, prunes, and cranberries. Vegetables All fresh vegetables.  Vegetables that are frozen without sauce or added salt. Low-sodium or sodium-free canned vegetables. Grains Bread with less than 80 mg of sodium per slice. Whole-wheat pasta, quinoa, and brown rice. Oats and oatmeal. Barley. Millet. Grits and cream of wheat. Whole-grain and whole-wheat cold cereal. Meats and other protein foods Lean cuts of meat. Skinless chicken and [place]. Fish with high omega-3 fatty acids, such as salmon, sardines, and other cold-water fishes. Eggs. Dried beans, peas, and edamame. Unsalted nuts and nut butters. Dairy Low-fat or nonfat (skim) milk and dried milk. Rice milk, soy milk, and almond milk. Low-fat or nonfat yogurt. Small amounts of reduced-sodium block cheese. Low-sodium cottage cheese. Fats and oils Olive, canola, soybean, flaxseed, avocado, or sunflower oil. Sweets and desserts Applesauce. Granola bars. Sugar-free pudding and gelatin. Frozen fruit bars. Seasoning and other foods Fresh and dried herbs. Lemon or lime juice. Vinegar. Low-sodium ketchup. Salt-free marinades, salad dressings, sauces, and seasonings. The items listed above may not be all the foods and drinks you can have. Talk with a dietitian to learn more. Foods to avoid Fruits Fruits that are dried with preservatives that contain sodium. Vegetables Canned vegetables. Frozen vegetables with sauce or seasonings. Creamed vegetables. [place] fries. Onion rings. Pickled vegetables and sauerkraut. Grains Bread with more than 80 mg of sodium per slice. Hot or cold cereal with more than 140 mg sodium per serving. Salted pretzels and crackers. Prepackaged breadcrumbs. Bagels, croissants, and biscuits. Meats and other protein foods Ribs and chicken wings. Bacon, ham, pepperoni, bologna, salami, and packaged luncheon meats. Hot dogs, bratwurst, and sausage. Canned meat. Smoked meat and fish. Salted nuts and seeds. Dairy Whole milk, half-and-half, and cream. Buttermilk. Processed cheese, cheese spreads, and  cheese curds. Regular cottage cheese. Feta cheese. Shredded cheese. String cheese. Fats and oils Butter, lard, shortening, ghee, and bacon fat. Canned and packaged gravies. Seasoning and other foods Onion salt, garlic salt, table salt, and sea salt. Marinades. Regular salad dressings. Relishes, pickles, and olives. Meat flavorings and tenderizers, and bouillon cubes. Horseradish, ketchup, and mustard. Worcestershire sauce. Teriyaki sauce, soy sauce (including reduced sodium). Hot sauce and Tabasco sauce. Steak sauce, fish sauce, oyster sauce, and cocktail sauce. Taco seasonings. Barbecue sauce. Tartar sauce. The items listed above may not be all the foods and drinks you should avoid. Talk with a dietitian to learn more. This information is not intended to replace advice given to you by your health care provider. Make sure you discuss any questions you have with your health care provider. Document Revised: 02/19/2023 Document Reviewed: 02/08/2023 Elsevier Patient Education  2024 [employer].

## 2023-09-03 ENCOUNTER — Other Ambulatory Visit (HOSPITAL_COMMUNITY): Payer: Self-pay

## 2023-09-03 ENCOUNTER — Other Ambulatory Visit: Payer: Self-pay

## 2023-09-04 ENCOUNTER — Telehealth: Payer: Self-pay | Admitting: Family

## 2023-09-04 ENCOUNTER — Other Ambulatory Visit: Payer: Self-pay

## 2023-09-04 ENCOUNTER — Other Ambulatory Visit: Payer: Self-pay | Admitting: Nurse Practitioner

## 2023-09-04 ENCOUNTER — Ambulatory Visit: Payer: Medicare Other | Admitting: Nurse Practitioner

## 2023-09-04 ENCOUNTER — Other Ambulatory Visit (HOSPITAL_COMMUNITY): Payer: Self-pay

## 2023-09-04 DIAGNOSIS — I10 Essential (primary) hypertension: Secondary | ICD-10-CM

## 2023-09-04 DIAGNOSIS — I5022 Chronic systolic (congestive) heart failure: Secondary | ICD-10-CM

## 2023-09-04 MED ORDER — POTASSIUM CHLORIDE CRYS ER 10 MEQ PO TBCR
EXTENDED_RELEASE_TABLET | ORAL | 0 refills | Status: DC
  Filled 2023-09-04 (×2): qty 5, 5d supply, fill #0

## 2023-09-04 MED ORDER — RIVASTIGMINE TARTRATE 3 MG PO CAPS
3.0000 mg | ORAL_CAPSULE | Freq: Two times a day (BID) | ORAL | 4 refills | Status: DC
  Filled 2023-09-04: qty 60, 30d supply, fill #0
  Filled 2023-09-13 – 2023-09-26 (×2): qty 60, 30d supply, fill #1
  Filled 2023-10-23 – 2023-10-24 (×3): qty 60, 30d supply, fill #2
  Filled 2023-11-12 – 2023-11-20 (×2): qty 60, 30d supply, fill #3
  Filled 2023-12-10: qty 60, 30d supply, fill #4
  Filled ????-??-??: fill #1
  Filled ????-??-??: fill #4

## 2023-09-04 MED ORDER — NICOTINE 21 MG/24HR TD PT24
21.0000 mg | MEDICATED_PATCH | Freq: Every day | TRANSDERMAL | 0 refills | Status: AC
  Filled 2023-09-04: qty 14, 14d supply, fill #0

## 2023-09-04 MED ORDER — LISINOPRIL 5 MG PO TABS
5.0000 mg | ORAL_TABLET | Freq: Every day | ORAL | 4 refills | Status: DC
  Filled 2023-09-04: qty 30, 30d supply, fill #0
  Filled 2023-09-13 – 2023-09-26 (×2): qty 30, 30d supply, fill #1
  Filled 2023-10-23 – 2023-10-24 (×3): qty 30, 30d supply, fill #2
  Filled 2023-11-12 – 2023-11-20 (×2): qty 30, 30d supply, fill #3
  Filled 2023-12-10: qty 30, 30d supply, fill #4
  Filled ????-??-??: fill #4
  Filled ????-??-??: fill #1

## 2023-09-04 MED ORDER — METOPROLOL SUCCINATE ER 25 MG PO TB24
25.0000 mg | ORAL_TABLET | Freq: Every day | ORAL | 4 refills | Status: DC
  Filled 2023-09-04: qty 30, 30d supply, fill #0
  Filled 2023-09-13 – 2023-09-26 (×2): qty 30, 30d supply, fill #1
  Filled 2023-10-23 – 2023-10-24 (×3): qty 30, 30d supply, fill #2
  Filled 2023-11-12 – 2023-11-20 (×2): qty 30, 30d supply, fill #3
  Filled 2023-12-10: qty 30, 30d supply, fill #4
  Filled ????-??-??: fill #4
  Filled ????-??-??: fill #1

## 2023-09-04 MED ORDER — NITROGLYCERIN 0.4 MG SL SUBL
0.4000 mg | SUBLINGUAL_TABLET | SUBLINGUAL | 1 refills | Status: AC | PRN
  Filled 2023-09-04: qty 25, 1d supply, fill #0

## 2023-09-04 MED ORDER — ASPIRIN 81 MG PO CHEW
81.0000 mg | CHEWABLE_TABLET | Freq: Every day | ORAL | 4 refills | Status: AC
  Filled 2023-09-04: qty 30, 30d supply, fill #0
  Filled 2023-09-13 – 2023-09-26 (×2): qty 30, 30d supply, fill #1
  Filled 2023-10-23 – 2023-10-24 (×3): qty 30, 30d supply, fill #2
  Filled 2023-11-12 – 2023-11-20 (×2): qty 30, 30d supply, fill #3
  Filled 2023-12-10 – 2023-12-20 (×2): qty 30, 30d supply, fill #4
  Filled 2024-01-23: qty 30, 30d supply, fill #5
  Filled 2024-02-26: qty 30, 30d supply, fill #6
  Filled 2024-03-27: qty 30, 30d supply, fill #7
  Filled 2024-04-29: qty 30, 30d supply, fill #8
  Filled 2024-05-27 – 2024-07-30 (×2): qty 30, 30d supply, fill #9
  Filled ????-??-??: fill #1

## 2023-09-04 MED ORDER — ANORO ELLIPTA 62.5-25 MCG/ACT IN AEPB
1.0000 | INHALATION_SPRAY | Freq: Every day | RESPIRATORY_TRACT | 4 refills | Status: DC
  Filled 2023-09-04: qty 60, 30d supply, fill #0
  Filled 2023-10-23 – 2023-10-24 (×3): qty 60, 30d supply, fill #1
  Filled 2023-11-12 – 2023-11-20 (×2): qty 60, 30d supply, fill #2
  Filled 2023-12-10 – 2023-12-13 (×3): qty 60, 30d supply, fill #3
  Filled ????-??-??: fill #3

## 2023-09-04 MED ORDER — VITAMIN D3 1.25 MG (50000 UT) PO CAPS
50000.0000 [IU] | ORAL_CAPSULE | ORAL | 4 refills | Status: AC
  Filled 2023-09-04: qty 12, 84d supply, fill #0
  Filled 2023-12-10 – 2023-12-13 (×3): qty 12, 84d supply, fill #1
  Filled 2024-02-26: qty 12, 84d supply, fill #2
  Filled 2024-05-27: qty 12, 84d supply, fill #3

## 2023-09-04 MED ORDER — ALBUTEROL SULFATE HFA 108 (90 BASE) MCG/ACT IN AERS
2.0000 | INHALATION_SPRAY | Freq: Four times a day (QID) | RESPIRATORY_TRACT | 2 refills | Status: AC | PRN
  Filled 2023-09-04: qty 6.7, 25d supply, fill #0
  Filled 2024-07-01: qty 6.7, 25d supply, fill #1

## 2023-09-04 MED ORDER — OMEPRAZOLE 20 MG PO CPDR
20.0000 mg | DELAYED_RELEASE_CAPSULE | Freq: Every day | ORAL | 4 refills | Status: AC
  Filled 2023-09-04: qty 30, 30d supply, fill #0
  Filled 2023-09-13 – 2023-09-26 (×2): qty 30, 30d supply, fill #1
  Filled 2023-10-23 – 2023-10-24 (×3): qty 30, 30d supply, fill #2
  Filled 2023-11-12 – 2023-11-20 (×2): qty 30, 30d supply, fill #3
  Filled 2023-12-10 – 2023-12-20 (×2): qty 30, 30d supply, fill #4
  Filled 2024-01-23: qty 30, 30d supply, fill #5
  Filled 2024-02-26: qty 30, 30d supply, fill #6
  Filled 2024-03-27: qty 30, 30d supply, fill #7
  Filled 2024-04-29: qty 30, 30d supply, fill #8
  Filled 2024-05-27: qty 30, 30d supply, fill #9
  Filled 2024-07-01: qty 30, 30d supply, fill #10
  Filled 2024-07-30: qty 30, 30d supply, fill #11
  Filled ????-??-??: fill #1

## 2023-09-04 MED ORDER — ATORVASTATIN CALCIUM 40 MG PO TABS
40.0000 mg | ORAL_TABLET | Freq: Every day | ORAL | 4 refills | Status: DC
  Filled 2023-09-04: qty 30, 30d supply, fill #0
  Filled 2023-09-13 – 2023-09-26 (×2): qty 30, 30d supply, fill #1
  Filled 2023-10-23 – 2023-10-24 (×3): qty 30, 30d supply, fill #2
  Filled 2023-11-12 – 2023-11-20 (×2): qty 30, 30d supply, fill #3
  Filled 2023-12-10: qty 30, 30d supply, fill #4
  Filled ????-??-??: fill #1
  Filled ????-??-??: fill #4

## 2023-09-04 MED ORDER — FUROSEMIDE 40 MG PO TABS
40.0000 mg | ORAL_TABLET | Freq: Every day | ORAL | 4 refills | Status: DC
  Filled 2023-09-04: qty 30, 30d supply, fill #0
  Filled 2023-09-04: qty 90, 90d supply, fill #0

## 2023-09-04 MED ORDER — B-12 1000 MCG PO TABS
1.0000 | ORAL_TABLET | Freq: Every day | ORAL | 4 refills | Status: AC
  Filled 2023-09-04: qty 30, 30d supply, fill #0
  Filled 2023-09-13 – 2023-09-26 (×2): qty 30, 30d supply, fill #1
  Filled 2023-10-23 – 2023-10-24 (×3): qty 30, 30d supply, fill #2
  Filled 2023-11-12 – 2023-11-20 (×2): qty 30, 30d supply, fill #3
  Filled 2023-12-10 – 2023-12-20 (×2): qty 30, 30d supply, fill #4
  Filled 2024-01-23: qty 30, 30d supply, fill #5
  Filled 2024-02-26: qty 30, 30d supply, fill #6
  Filled 2024-03-27: qty 30, 30d supply, fill #7
  Filled 2024-04-29: qty 30, 30d supply, fill #8
  Filled 2024-05-27 – 2024-06-02 (×2): qty 30, 30d supply, fill #9
  Filled 2024-07-01: qty 30, 30d supply, fill #10
  Filled 2024-07-30: qty 30, 30d supply, fill #11
  Filled ????-??-??: fill #1

## 2023-09-04 NOTE — Progress Notes (Signed)
   09/04/2023  Patient ID: Blake [name], male   DOB: 1956/02/11, 68 y.o.   MRN: [id number]  Contacted [facility] to follow-up on [facility], and they have still not received these prescriptions to be able to initiate compliance packaging and home delivery.  Pending new prescriptions for PCP to sign to go to [facility] if in agreement.  Once sent, I will contact [facility] to [facility] any [facility].  [name], PharmD, DPLA

## 2023-09-04 NOTE — Progress Notes (Deleted)
 Advanced Heart Failure Clinic Note   Referring Physician: Aura Dials, NP PCP: Marjie Skiff, NP (last seen 02/25) Cardiologist: Dorothyann Peng, MD (last seen 05/24)  Chief Complaint:   HPI:  [name] is a 68 y/o male with a history of DM, anemia, COPD, CAD, MI X2, CABG X4 in 2009, dementia, depression, HTN, hepatitis C, hyperlipidemia, pulmonary HTN, tobacco use and chronic heart failure.   Echo 06/08/19: EF 35-40% with mild/ mod MR, normal RV, mild TR Echo 12/17/20: EF 35%, moderate LAE, severe TR, mod/ severe MR  LHC 06/09/19: 1.  Significant underlying three-vessel coronary artery disease with patent LIMA to LAD and SVG to diagonal.  Patent stent in the left circumflex with no significant restenosis.  Known chronically occluded small right coronary artery with bridging and left-to-right collaterals.  Chronically occluded SVG to RCA and SVG to OM.  No significant change in coronary anatomy since 2016. 2.  Mildly to moderately reduced LV systolic function with an EF of 40 to 45% with anterior apical hypokinesis.  Mildly elevated left ventricular end-diastolic pressure at 20 mmHg.  He presents today for his initial visit with a chief complaint of    Review of Systems: [y] = yes, [ ]  = no   General: Weight gain [ ] ; Weight loss [ ] ; Anorexia [ ] ; Fatigue [ ] ; Fever [ ] ; Chills [ ] ; Weakness [ ]   Cardiac: Chest pain/pressure [ ] ; Resting SOB [ ] ; Exertional SOB [ ] ; Orthopnea [ ] ; Pedal Edema [ ] ; Palpitations [ ] ; Syncope [ ] ; Presyncope [ ] ; Paroxysmal nocturnal dyspnea[ ]   Pulmonary: Cough [ ] ; Wheezing[ ] ; Hemoptysis[ ] ; Sputum [ ] ; Snoring [ ]   GI: Vomiting[ ] ; Dysphagia[ ] ; Melena[ ] ; Hematochezia [ ] ; Heartburn[ ] ; Abdominal pain [ ] ; Constipation [ ] ; Diarrhea [ ] ; BRBPR [ ]   GU: Hematuria[ ] ; Dysuria [ ] ; Nocturia[ ]   Vascular: Pain in legs with walking [ ] ; Pain in feet with lying flat [ ] ; Non-healing sores [ ] ; Stroke [ ] ; TIA [ ] ; Slurred speech [ ] ;   Neuro: Headaches[ ] ; Vertigo[ ] ; Seizures[ ] ; Paresthesias[ ] ;Blurred vision [ ] ; Diplopia [ ] ; Vision changes [ ]   Ortho/Skin: Arthritis [ ] ; Joint pain [ ] ; Muscle pain [ ] ; Joint swelling [ ] ; Back Pain [ ] ; Rash [ ]   Psych: Depression[ ] ; Anxiety[ ]   Heme: Bleeding problems [ ] ; Clotting disorders [ ] ; Anemia [ ]   Endocrine: Diabetes [ ] ; Thyroid dysfunction[ ]    Past Medical History:  Diagnosis Date   Acute headache    CHF (congestive heart failure) (HCC)    COPD (chronic obstructive pulmonary disease) (HCC)    Coronary artery disease    a. cath 2009 w/ severe 3-vessel dz, rec bypass; b. MI 2012-->4-vessel cabg; c. cath 2016: occluded VG-OM and VG-RPDA, patent LIMA-LAD and patent VG-Diag, 95% stenosis of mid LCx s/p PCI/DES 0% residual stenosis   Depression    Hepatitis B    Hepatitis C    HFrEF (heart failure with reduced ejection fraction) (HCC)    a. LV gram 2009 with EF 32% with anterolateral and apical HK   Hyperlipidemia    Hypertension    MI (myocardial infarction) (HCC)    x 2   Tobacco abuse     Current Outpatient Medications  Medication Sig Dispense Refill   albuterol (VENTOLIN HFA) 108 (90 Base) MCG/ACT inhaler Inhale 2 puffs into the lungs every 6 (six) hours as needed. 6.7 g 2  aspirin 81 MG chewable tablet Chew 1 tablet (81 mg total) by mouth daily. 90 tablet 4   atorvastatin (LIPITOR) 40 MG tablet Take 1 tablet (40 mg total) by mouth daily. 90 tablet 4   Cholecalciferol (VITAMIN D3) 1.25 MG (50000 UT) CAPS Take 1 capsule (50,000 Units total) by mouth every 7 (seven) days. 12 capsule 4   Cyanocobalamin (B-12) 1000 MCG TABS Take 1 tablet (1,000 mcg total) by mouth daily. 90 tablet 4   furosemide (LASIX) 40 MG tablet Take 1 tablet (40 mg total) by mouth daily. 90 tablet 4   lisinopril (ZESTRIL) 5 MG tablet Take 1 tablet (5 mg total) by mouth daily. 90 tablet 4   metoprolol succinate (TOPROL-XL) 25 MG 24 hr tablet Take 1 tablet (25 mg total) by mouth daily. 90  tablet 4   nicotine (NICODERM CQ - DOSED IN MG/24 HOURS) 21 mg/24hr patch Place 1 patch (21 mg total) onto the skin daily. 42 patch 0   nitroGLYCERIN (NITROSTAT) 0.4 MG SL tablet Place 1 tablet (0.4 mg total) under the tongue every 5 (five) minutes as needed for chest pain. Reported on 09/29/2015 25 tablet 1   omeprazole (PRILOSEC) 20 MG capsule Take 1 capsule (20 mg total) by mouth daily. 90 capsule 4   potassium chloride (KLOR-CON M) 10 MEQ tablet Take 1 tablet (10 mEq total) by mouth daily for 5 days. 5 tablet 0   rivastigmine (EXELON) 3 MG capsule Take 1 capsule (3 mg total) by mouth 2 (two) times daily. 180 capsule 4   umeclidinium-vilanterol (ANORO ELLIPTA) 62.5-25 MCG/ACT AEPB Inhale 1 puff into the lungs daily at 6 (six) AM. 60 each 4   No current facility-administered medications for this visit.    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   Sexual activity: Not Currently  Other Topics Concern   Not on file  Social History Narrative   Not on file   Social Drivers of Health   Financial Resource Strain: Low Risk  (03/20/2023)   Overall Financial Resource Strain (CARDIA)    Difficulty of Paying Living Expenses: Not very hard  Food Insecurity: No Food Insecurity (03/20/2023)   Hunger Vital Sign    Worried About Running Out of Food in the Last Year: Never true    Ran Out of Food in the Last Year: Never true   Transportation Needs: No Transportation Needs (03/20/2023)   PRAPARE - Administrator, Civil Service (Medical): No    Lack of Transportation (Non-Medical): No  Physical Activity: Inactive (03/20/2023)   Exercise Vital Sign    Days of Exercise per Week: 0 days    Minutes of Exercise per Session: 0 min  Stress: No Stress Concern Present (03/20/2023)   [employer] of Occupational Health -  Occupational Stress Questionnaire    Feeling of Stress : Only a little  Social Connections: Socially Isolated (03/20/2023)   Social Connection and Isolation Panel [NHANES]    Frequency of Communication with Friends and Family: More than three times a week    Frequency of Social Gatherings with Friends and Family: More than three times a week    Attends Religious Services: Never    Database administrator or Organizations: No    Attends Banker Meetings: Never    Marital Status: Separated  Intimate Partner Violence: Not At Risk (03/20/2023)   Humiliation, Afraid, Rape, and Kick questionnaire    Fear of Current or Ex-Partner: No    Emotionally Abused: No    Physically Abused: No    Sexually Abused: No      Family History  Problem Relation Age of Onset   Hypertension Mother    Hypertension Father    Hypertension Sister    Hypertension Sister     There were no vitals filed for this visit.   PHYSICAL EXAM: General:  Well appearing. No respiratory difficulty HEENT: normal Neck: supple. no JVD. Carotids 2+ bilat; no bruits. No lymphadenopathy or thyromegaly appreciated. Cor: PMI nondisplaced. Regular rate & rhythm. No rubs, gallops or murmurs. Lungs: clear Abdomen: soft, nontender, nondistended. No hepatosplenomegaly. No bruits or masses. Good bowel sounds. Extremities: no cyanosis, clubbing, rash, edema Neuro: alert & oriented x 3, cranial nerves grossly intact. moves all 4 extremities w/o difficulty. Affect pleasant.  ECG:   ASSESSMENT & PLAN:  1: Chronic heart  failure with reduced ejection fraction-  - suspect due to - NYHA class - euvolemic - weighing daily - Echo 12/17/20: EF 35%, moderate LAE, severe TR, mod/ severe MR - will get echo updated - continue  - BNP 07/25/22 reviewed and was 319.5  2: HTN- - BP - saw PCP Har[facility]) 02/25 - BMET 08/14/23 reviewed and showed sodium 142, potassium 3.4, creatinine 1.13 & GFR 70  3: CAD- - CABG 2009 - saw cardiology Juliann Pares) 05/24  4: Hyperlipidemia- - LDL 08/14/23 reviewed and was [address], [place] 09/04/23

## 2023-09-04 NOTE — Progress Notes (Signed)
   09/04/2023  Patient ID: Blake [name] Radi, male   DOB: 1955/11/01, 68 y.o.   MRN: [id number]  New prescriptions for maintenance meds have all been sent electronically to [facility] for adherence packaging and home delivery.  [facility] [place] [facility] any [facility], so fills do not get duplicated in the future.  Pharmacy states all medications have been deactivated, and they state patient never picked up prescribed potassium from earlier this month.  Consulting PCP to see if she would like to send this to [facility] as well or wait until patient is seen again later this afternoon.  [name], PharmD, DPLA

## 2023-09-04 NOTE — Telephone Encounter (Signed)
 Pt confirmed appt for 09/05/23

## 2023-09-05 ENCOUNTER — Other Ambulatory Visit: Payer: Self-pay

## 2023-09-05 ENCOUNTER — Encounter: Payer: Medicare Other | Admitting: Family

## 2023-09-05 NOTE — Telephone Encounter (Signed)
 Patient did not show for his Heart Failure Clinic appointment on 09/05/23.

## 2023-09-06 ENCOUNTER — Other Ambulatory Visit: Payer: Self-pay

## 2023-09-06 ENCOUNTER — Other Ambulatory Visit (HOSPITAL_COMMUNITY): Payer: Self-pay

## 2023-09-07 ENCOUNTER — Other Ambulatory Visit (HOSPITAL_COMMUNITY): Payer: Self-pay

## 2023-09-10 ENCOUNTER — Other Ambulatory Visit: Payer: Self-pay

## 2023-09-13 ENCOUNTER — Other Ambulatory Visit: Payer: Self-pay

## 2023-09-26 ENCOUNTER — Other Ambulatory Visit: Payer: Self-pay

## 2023-09-27 ENCOUNTER — Other Ambulatory Visit (HOSPITAL_COMMUNITY): Payer: Self-pay

## 2023-09-27 ENCOUNTER — Other Ambulatory Visit: Payer: Self-pay

## 2023-09-27 ENCOUNTER — Ambulatory Visit: Admitting: Nurse Practitioner

## 2023-09-28 ENCOUNTER — Other Ambulatory Visit: Payer: Self-pay

## 2023-10-18 ENCOUNTER — Ambulatory Visit: Admitting: Nurse Practitioner

## 2023-10-23 ENCOUNTER — Other Ambulatory Visit: Payer: Self-pay

## 2023-10-24 ENCOUNTER — Other Ambulatory Visit (HOSPITAL_COMMUNITY): Payer: Self-pay

## 2023-10-24 ENCOUNTER — Other Ambulatory Visit: Payer: Self-pay

## 2023-10-25 ENCOUNTER — Other Ambulatory Visit: Payer: Self-pay

## 2023-11-12 ENCOUNTER — Other Ambulatory Visit: Payer: Self-pay

## 2023-11-14 ENCOUNTER — Ambulatory Visit: Admitting: Nurse Practitioner

## 2023-11-20 ENCOUNTER — Other Ambulatory Visit: Payer: Self-pay

## 2023-11-21 ENCOUNTER — Other Ambulatory Visit: Payer: Self-pay

## 2023-11-22 ENCOUNTER — Other Ambulatory Visit: Payer: Self-pay

## 2023-11-24 NOTE — Patient Instructions (Incomplete)
 Insomnia Insomnia is a sleep disorder that makes it difficult to fall asleep or stay asleep. Insomnia can cause fatigue, low energy, difficulty concentrating, mood swings, and poor performance at work or school. There are three different ways to classify insomnia: Difficulty falling asleep. Difficulty staying asleep. Waking up too early in the morning. Any type of insomnia can be long-term (chronic) or short-term (acute). Both are common. Short-term insomnia usually lasts for 3 months or less. Chronic insomnia occurs at least three times a week for longer than 3 months. What are the causes? Insomnia may be caused by another condition, situation, or substance, such as: Having certain mental health conditions, such as anxiety and depression. Using caffeine, alcohol, tobacco, or drugs. Having gastrointestinal conditions, such as gastroesophageal reflux disease (GERD). Having certain medical conditions. These include: Asthma. Alzheimer's disease. Stroke. Chronic pain. An overactive thyroid gland (hyperthyroidism). Other sleep disorders, such as restless legs syndrome and sleep apnea. Menopause. Sometimes, the cause of insomnia may not be known. What increases the risk? Risk factors for insomnia include: Gender. Females are affected more often than males. Age. Insomnia is more common as people get older. Stress and certain medical and mental health conditions. Lack of exercise. Having an irregular work schedule. This may include working night shifts and traveling between different time zones. What are the signs or symptoms? If you have insomnia, the main symptom is having trouble falling asleep or having trouble staying asleep. This may lead to other symptoms, such as: Feeling tired or having low energy. Feeling nervous about going to sleep. Not feeling rested in the morning. Having trouble concentrating. Feeling irritable, anxious, or depressed. How is this diagnosed? This condition  may be diagnosed based on: Your symptoms and medical history. Your health care provider may ask about: Your sleep habits. Any medical conditions you have. Your mental health. A physical exam. How is this treated? Treatment for insomnia depends on the cause. Treatment may focus on treating an underlying condition that is causing the insomnia. Treatment may also include: Medicines to help you sleep. Counseling or therapy. Lifestyle adjustments to help you sleep better. Follow these instructions at home: Eating and drinking  Limit or avoid alcohol, caffeinated beverages, and products that contain nicotine and tobacco, especially close to bedtime. These can disrupt your sleep. Do not eat a large meal or eat spicy foods right before bedtime. This can lead to digestive discomfort that can make it hard for you to sleep. Sleep habits  Keep a sleep diary to help you and your health care provider figure out what could be causing your insomnia. Write down: When you sleep. When you wake up during the night. How well you sleep and how rested you feel the next day. Any side effects of medicines you are taking. What you eat and drink. Make your bedroom a dark, comfortable place where it is easy to fall asleep. Put up shades or blackout curtains to block light from outside. Use a white noise machine to block noise. Keep the temperature cool. Limit screen use before bedtime. This includes: Not watching TV. Not using your smartphone, tablet, or computer. Stick to a routine that includes going to bed and waking up at the same times every day and night. This can help you fall asleep faster. Consider making a quiet activity, such as reading, part of your nighttime routine. Try to avoid taking naps during the day so that you sleep better at night. Get out of bed if you are still awake after  15 minutes of trying to sleep. Keep the lights down, but try reading or doing a quiet activity. When you feel  sleepy, go back to bed. General instructions Take over-the-counter and prescription medicines only as told by your health care provider. Exercise regularly as told by your health care provider. However, avoid exercising in the hours right before bedtime. Use relaxation techniques to manage stress. Ask your health care provider to suggest some techniques that may work well for you. These may include: Breathing exercises. Routines to release muscle tension. Visualizing peaceful scenes. Make sure that you drive carefully. Do not drive if you feel very sleepy. Keep all follow-up visits. This is important. Contact a health care provider if: You are tired throughout the day. You have trouble in your daily routine due to sleepiness. You continue to have sleep problems, or your sleep problems get worse. Get help right away if: You have thoughts about hurting yourself or someone else. Get help right away if you feel like you may hurt yourself or others, or have thoughts about taking your own life. Go to your nearest emergency room or: Call 911. Call the National Suicide Prevention Lifeline at (906)021-1611 or 988. This is open 24 hours a day. Text the Crisis Text Line at 325-069-2793. Summary Insomnia is a sleep disorder that makes it difficult to fall asleep or stay asleep. Insomnia can be long-term (chronic) or short-term (acute). Treatment for insomnia depends on the cause. Treatment may focus on treating an underlying condition that is causing the insomnia. Keep a sleep diary to help you and your health care provider figure out what could be causing your insomnia. This information is not intended to replace advice given to you by your health care provider. Make sure you discuss any questions you have with your health care provider. Document Revised: 06/06/2021 Document Reviewed: 06/06/2021 Elsevier Patient Education  2024 [employer].

## 2023-11-27 ENCOUNTER — Ambulatory Visit: Admitting: Nurse Practitioner

## 2023-12-07 ENCOUNTER — Telehealth: Payer: Self-pay

## 2023-12-07 NOTE — Telephone Encounter (Signed)
 Returned call to patients sister. She is aware she needs to come into the office to complete a medical records request form so the request can be started.

## 2023-12-07 NOTE — Telephone Encounter (Signed)
 Copied from CRM [id number]. Topic: Medical Record Request - Records Request >> Dec 07, 2023 10:42 AM Hobson Luna F wrote: Reason for CRM: Patient's sister is calling in because patient needs a copy of his medical records.

## 2023-12-08 NOTE — Patient Instructions (Incomplete)
 Insomnia Insomnia is a sleep disorder that makes it difficult to fall asleep or stay asleep. Insomnia can cause fatigue, low energy, difficulty concentrating, mood swings, and poor performance at work or school. There are three different ways to classify insomnia: Difficulty falling asleep. Difficulty staying asleep. Waking up too early in the morning. Any type of insomnia can be long-term (chronic) or short-term (acute). Both are common. Short-term insomnia usually lasts for 3 months or less. Chronic insomnia occurs at least three times a week for longer than 3 months. What are the causes? Insomnia may be caused by another condition, situation, or substance, such as: Having certain mental health conditions, such as anxiety and depression. Using caffeine, alcohol, tobacco, or drugs. Having gastrointestinal conditions, such as gastroesophageal reflux disease (GERD). Having certain medical conditions. These include: Asthma. Alzheimer's disease. Stroke. Chronic pain. An overactive thyroid gland (hyperthyroidism). Other sleep disorders, such as restless legs syndrome and sleep apnea. Menopause. Sometimes, the cause of insomnia may not be known. What increases the risk? Risk factors for insomnia include: Gender. Females are affected more often than males. Age. Insomnia is more common as people get older. Stress and certain medical and mental health conditions. Lack of exercise. Having an irregular work schedule. This may include working night shifts and traveling between different time zones. What are the signs or symptoms? If you have insomnia, the main symptom is having trouble falling asleep or having trouble staying asleep. This may lead to other symptoms, such as: Feeling tired or having low energy. Feeling nervous about going to sleep. Not feeling rested in the morning. Having trouble concentrating. Feeling irritable, anxious, or depressed. How is this diagnosed? This condition  may be diagnosed based on: Your symptoms and medical history. Your health care provider may ask about: Your sleep habits. Any medical conditions you have. Your mental health. A physical exam. How is this treated? Treatment for insomnia depends on the cause. Treatment may focus on treating an underlying condition that is causing the insomnia. Treatment may also include: Medicines to help you sleep. Counseling or therapy. Lifestyle adjustments to help you sleep better. Follow these instructions at home: Eating and drinking  Limit or avoid alcohol, caffeinated beverages, and products that contain nicotine and tobacco, especially close to bedtime. These can disrupt your sleep. Do not eat a large meal or eat spicy foods right before bedtime. This can lead to digestive discomfort that can make it hard for you to sleep. Sleep habits  Keep a sleep diary to help you and your health care provider figure out what could be causing your insomnia. Write down: When you sleep. When you wake up during the night. How well you sleep and how rested you feel the next day. Any side effects of medicines you are taking. What you eat and drink. Make your bedroom a dark, comfortable place where it is easy to fall asleep. Put up shades or blackout curtains to block light from outside. Use a white noise machine to block noise. Keep the temperature cool. Limit screen use before bedtime. This includes: Not watching TV. Not using your smartphone, tablet, or computer. Stick to a routine that includes going to bed and waking up at the same times every day and night. This can help you fall asleep faster. Consider making a quiet activity, such as reading, part of your nighttime routine. Try to avoid taking naps during the day so that you sleep better at night. Get out of bed if you are still awake after  15 minutes of trying to sleep. Keep the lights down, but try reading or doing a quiet activity. When you feel  sleepy, go back to bed. General instructions Take over-the-counter and prescription medicines only as told by your health care provider. Exercise regularly as told by your health care provider. However, avoid exercising in the hours right before bedtime. Use relaxation techniques to manage stress. Ask your health care provider to suggest some techniques that may work well for you. These may include: Breathing exercises. Routines to release muscle tension. Visualizing peaceful scenes. Make sure that you drive carefully. Do not drive if you feel very sleepy. Keep all follow-up visits. This is important. Contact a health care provider if: You are tired throughout the day. You have trouble in your daily routine due to sleepiness. You continue to have sleep problems, or your sleep problems get worse. Get help right away if: You have thoughts about hurting yourself or someone else. Get help right away if you feel like you may hurt yourself or others, or have thoughts about taking your own life. Go to your nearest emergency room or: Call 911. Call the National Suicide Prevention Lifeline at (906)021-1611 or 988. This is open 24 hours a day. Text the Crisis Text Line at 325-069-2793. Summary Insomnia is a sleep disorder that makes it difficult to fall asleep or stay asleep. Insomnia can be long-term (chronic) or short-term (acute). Treatment for insomnia depends on the cause. Treatment may focus on treating an underlying condition that is causing the insomnia. Keep a sleep diary to help you and your health care provider figure out what could be causing your insomnia. This information is not intended to replace advice given to you by your health care provider. Make sure you discuss any questions you have with your health care provider. Document Revised: 06/06/2021 Document Reviewed: 06/06/2021 Elsevier Patient Education  2024 [employer].

## 2023-12-10 ENCOUNTER — Other Ambulatory Visit: Payer: Self-pay

## 2023-12-11 ENCOUNTER — Ambulatory Visit: Admitting: Nurse Practitioner

## 2023-12-12 ENCOUNTER — Telehealth: Payer: Self-pay

## 2023-12-12 NOTE — Telephone Encounter (Signed)
 Copied from CRM [id number]. Topic: Medical Record Request - Other >> Dec 12, 2023  8:23 AM Rosaria Common wrote: Reason for CRM: Patterson Bora calling to verify if form of release for diagnosis is ready for pickup at the front desk. Callback number is [phone].

## 2023-12-13 ENCOUNTER — Other Ambulatory Visit (HOSPITAL_COMMUNITY): Payer: Self-pay

## 2023-12-19 ENCOUNTER — Other Ambulatory Visit: Payer: Self-pay

## 2023-12-19 ENCOUNTER — Other Ambulatory Visit (HOSPITAL_COMMUNITY): Payer: Self-pay

## 2023-12-19 ENCOUNTER — Encounter: Payer: Self-pay | Admitting: Nurse Practitioner

## 2023-12-19 ENCOUNTER — Ambulatory Visit (INDEPENDENT_AMBULATORY_CARE_PROVIDER_SITE_OTHER): Admitting: Nurse Practitioner

## 2023-12-19 VITALS — BP 126/70 | HR 75 | Temp 97.9°F | Ht 64.0 in | Wt 159.0 lb

## 2023-12-19 DIAGNOSIS — I10 Essential (primary) hypertension: Secondary | ICD-10-CM

## 2023-12-19 DIAGNOSIS — I25118 Atherosclerotic heart disease of native coronary artery with other forms of angina pectoris: Secondary | ICD-10-CM | POA: Diagnosis not present

## 2023-12-19 DIAGNOSIS — R7301 Impaired fasting glucose: Secondary | ICD-10-CM

## 2023-12-19 DIAGNOSIS — F0390 Unspecified dementia without behavioral disturbance: Secondary | ICD-10-CM | POA: Diagnosis not present

## 2023-12-19 DIAGNOSIS — F17220 Nicotine dependence, chewing tobacco, uncomplicated: Secondary | ICD-10-CM

## 2023-12-19 DIAGNOSIS — I5022 Chronic systolic (congestive) heart failure: Secondary | ICD-10-CM | POA: Diagnosis not present

## 2023-12-19 DIAGNOSIS — E7800 Pure hypercholesterolemia, unspecified: Secondary | ICD-10-CM

## 2023-12-19 DIAGNOSIS — J432 Centrilobular emphysema: Secondary | ICD-10-CM

## 2023-12-19 DIAGNOSIS — F324 Major depressive disorder, single episode, in partial remission: Secondary | ICD-10-CM

## 2023-12-19 DIAGNOSIS — D649 Anemia, unspecified: Secondary | ICD-10-CM | POA: Diagnosis not present

## 2023-12-19 LAB — MICROALBUMIN, URINE WAIVED
Creatinine, Urine Waived: 200 mg/dL (ref 10–300)
Microalb, Ur Waived: 150 mg/L — ABNORMAL HIGH (ref 0–19)

## 2023-12-19 LAB — BAYER DCA HB A1C WAIVED: HB A1C (BAYER DCA - WAIVED): 5.8 % — ABNORMAL HIGH (ref 4.8–5.6)

## 2023-12-19 MED ORDER — TRAZODONE HCL 50 MG PO TABS
25.0000 mg | ORAL_TABLET | Freq: Every evening | ORAL | 3 refills | Status: AC | PRN
  Filled 2023-12-19: qty 90, 90d supply, fill #0
  Filled 2024-06-02 – 2024-06-03 (×3): qty 30, 30d supply, fill #1
  Filled 2024-07-01: qty 30, 30d supply, fill #2
  Filled 2024-07-30: qty 30, 30d supply, fill #3

## 2023-12-19 MED ORDER — METOPROLOL SUCCINATE ER 25 MG PO TB24
25.0000 mg | ORAL_TABLET | Freq: Every day | ORAL | 4 refills | Status: DC
  Filled 2023-12-19: qty 30, 30d supply, fill #0
  Filled 2024-01-23: qty 30, 30d supply, fill #1
  Filled 2024-02-26: qty 30, 30d supply, fill #2
  Filled 2024-03-27: qty 30, 30d supply, fill #3
  Filled 2024-04-29: qty 30, 30d supply, fill #4

## 2023-12-19 MED ORDER — RIVASTIGMINE TARTRATE 3 MG PO CAPS
3.0000 mg | ORAL_CAPSULE | Freq: Two times a day (BID) | ORAL | 4 refills | Status: AC
  Filled 2023-12-19: qty 60, 30d supply, fill #0
  Filled 2024-01-23: qty 60, 30d supply, fill #1
  Filled 2024-02-26: qty 60, 30d supply, fill #2
  Filled 2024-03-27: qty 60, 30d supply, fill #3
  Filled 2024-04-29: qty 60, 30d supply, fill #4
  Filled 2024-05-27: qty 60, 30d supply, fill #5
  Filled 2024-07-01: qty 60, 30d supply, fill #6
  Filled 2024-07-30: qty 60, 30d supply, fill #7

## 2023-12-19 MED ORDER — LISINOPRIL 5 MG PO TABS
5.0000 mg | ORAL_TABLET | Freq: Every day | ORAL | 4 refills | Status: DC
  Filled 2023-12-19: qty 30, 30d supply, fill #0
  Filled 2024-01-23: qty 30, 30d supply, fill #1
  Filled 2024-02-26: qty 30, 30d supply, fill #2
  Filled 2024-03-27: qty 30, 30d supply, fill #3
  Filled 2024-04-29: qty 30, 30d supply, fill #4

## 2023-12-19 MED ORDER — ATORVASTATIN CALCIUM 40 MG PO TABS
40.0000 mg | ORAL_TABLET | Freq: Every day | ORAL | 4 refills | Status: AC
  Filled 2023-12-19: qty 30, 30d supply, fill #0
  Filled 2024-01-23: qty 30, 30d supply, fill #1
  Filled 2024-02-26: qty 30, 30d supply, fill #2
  Filled 2024-03-27: qty 30, 30d supply, fill #3
  Filled 2024-04-29: qty 30, 30d supply, fill #4
  Filled 2024-05-27: qty 30, 30d supply, fill #5
  Filled 2024-07-01: qty 30, 30d supply, fill #6
  Filled 2024-07-30: qty 30, 30d supply, fill #7

## 2023-12-19 MED ORDER — UMECLIDINIUM-VILANTEROL 62.5-25 MCG/ACT IN AEPB
1.0000 | INHALATION_SPRAY | Freq: Every day | RESPIRATORY_TRACT | 4 refills | Status: AC
  Filled 2023-12-19: qty 60, 60d supply, fill #0
  Filled 2024-07-01: qty 60, 30d supply, fill #0

## 2023-12-19 MED ORDER — FUROSEMIDE 40 MG PO TABS
40.0000 mg | ORAL_TABLET | Freq: Every day | ORAL | 4 refills | Status: DC
  Filled 2023-12-19: qty 30, 30d supply, fill #0
  Filled 2024-01-23: qty 30, 30d supply, fill #1
  Filled 2024-02-26: qty 30, 30d supply, fill #2
  Filled 2024-03-27: qty 30, 30d supply, fill #3
  Filled 2024-04-29: qty 30, 30d supply, fill #4

## 2023-12-19 NOTE — Assessment & Plan Note (Signed)
 I have recommended complete cessation of tobacco use. I have discussed various options available for assistance with tobacco cessation including over the counter methods (Nicotine gum, patch and lozenges). We also discussed prescription options (Chantix, Nicotine Inhaler / Nasal Spray). The patient is not interested in pursuing any prescription tobacco cessation options at this time.

## 2023-12-19 NOTE — Assessment & Plan Note (Signed)
 Chronic, ongoing.  Continue current medication regimen and adjust as needed. Lipid panel today.

## 2023-12-19 NOTE — Assessment & Plan Note (Signed)
 A1c 5.8% today, trend down from 6.1%, initiate medication as needed.

## 2023-12-19 NOTE — Assessment & Plan Note (Signed)
Chronic, ongoing.  Denies SI/HI.  No current medications and scoring is stable.  Initiate medication as needed in future.  Could consider Zoloft.

## 2023-12-19 NOTE — Patient Instructions (Signed)

## 2023-12-19 NOTE — Assessment & Plan Note (Signed)
 Chronic, ongoing.  Continue to collaborate with cardiology, recommend they schedule follow-up.  Sister is main caregiver.  He has pill packs now, but unsure he is consistently taking medications. Continue to collaborate with [facility] Pharm[facility].

## 2023-12-19 NOTE — Progress Notes (Signed)
 BP 126/70 (BP Location: Left Arm, Patient Position: Sitting)   Pulse 75   Temp 97.9 F (36.6 C) (Oral)   Ht 5' 4 (1.626 m)   Wt 159 lb (72.1 kg)   SpO2 96%   BMI 27.29 kg/m    Subjective:    Patient ID: Blake [name], male    DOB: [birth year], 68 y.o.   MRN: [id number]  HPI: Jarin Heimann is a 68 y.o. male  Chief Complaint  Patient presents with   Congestive Heart Failure   Dementia   Hyperlipidemia   Hypertension   He lives with sister, she is often at visits but not present today -- was on phone briefly. Last visit 08/14/23, but missed 1 week follow-up after that for his acute HF.  Missed multiple visits after this that were scheduled.  Has not attended specialists as has been recommended multiple times. Unsure he is consistently taking medications, although reports he is.  Did work with [facility] Pharm[facility] on pills packs.  HYPERTENSION / HYPERLIPIDEMIA/HF Continues Metoprolol , Lisinopril , Lasix . Saw cardiology last, Dr. Beau Bound, on 11/27/22, they cleared him for hernia surgery + increased his Lisinopril  dose.  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 Has not seen HF Clinic since November 2022.  Sister helps care for him. Denies SOB today. States he has been exercising at the park lately. Satisfied with current treatment? yes Duration of hypertension: chronic BP monitoring frequency: not checking BP range:  BP medication side effects: no Duration of hyperlipidemia: chronic Cholesterol medication side effects: no Cholesterol supplements: none Medication compliance: poor compliance Aspirin : yes Recent stressors: no Recurrent headaches: no Visual changes: no Palpitations: no Dyspnea: no and denies orthopnea Chest pain: no Lower extremity edema: yes, occasional to legs Dizzy/lightheaded: sometimes  COPD Smokes 1 1/2 PPD.  Has smoked since he was 82 to 68 years old.  Has Anoro and reports using every other day.  Denies recent Albuterol  use.  Does endorse coughing  one to two times a day. COPD status: stable Satisfied with current treatment?: yes Oxygen use: no Dyspnea frequency: non Cough frequency: occasional Rescue inhaler frequency: 2 times a day Limitation of activity: no Productive cough: no Last Spirometry: unknown Pneumovax: Up To Date Influenza: refused  Impaired Fasting Glucose HbA1C:  Lab Results  Component Value Date   HGBA1C 6.1 (H) 08/14/2023  Duration of elevated blood sugar:  Polydipsia: no Polyuria: no Weight change: no Visual disturbance: no Glucose Monitoring: no    Accucheck frequency: Not Checking    Fasting glucose:     Post prandial:  Diabetic Education: Not Completed Family history of diabetes: no   DEPRESSION & MEMORY CHANGES Has not seen neurology since 10/14/2021, when was started on Exelon  for dementia, 3 MG BID, and Aricept was stopped due to muscle cramps.  Family history of uncle with dementia.  Has not been sleeping well for awhile, having nightmares. Has these every other night.   Mood status: stable Depressed mood: occasional Anxious mood: occasional Anhedonia: no Significant weight loss or gain: no Insomnia: yes hard to fall asleep Fatigue: yes Feelings of worthlessness or guilt: no Impaired concentration/indecisiveness: no Suicidal ideations: no Hopelessness: no Crying spells: no    12/19/2023    2:33 PM 08/14/2023    5:07 PM 05/04/2023    3:25 PM 11/15/2022   11:33 AM 02/16/2022    9:12 AM  Depression screen PHQ 2/9  Decreased Interest 0 1 1 0 0  Down, Depressed, Hopeless 0 1  1 0 0  PHQ - 2 Score 0 2 2 0 0  Altered sleeping 2 1 0 0   Tired, decreased energy 0 2 1 0   Change in appetite 0 0 0 0   Feeling bad or failure about yourself  0 0 0 0   Trouble concentrating 0 0  0   Moving slowly or fidgety/restless 0  0 0   Suicidal thoughts 0 0 0 0   PHQ-9 Score 2 5 3  0   Difficult doing work/chores  Not difficult at all  Not difficult at all        12/19/2023    2:34 PM 08/14/2023    5:08  PM 05/04/2023    3:26 PM 11/15/2022   11:33 AM  GAD 7 : Generalized Anxiety Score  Nervous, Anxious, on Edge 0 0 1 0  Control/stop worrying 0 0 0 0  Worry too much - different things 0 0 0 0  Trouble relaxing 0 0 1 0  Restless 0 0 0 0  Easily annoyed or irritable 0 0 0 0  Afraid - awful might happen 0 0 0 0  Total GAD 7 Score 0 0 2 0  Anxiety Difficulty Not difficult at all Not difficult at all Not difficult at all Not difficult at all      12/19/2023    2:52 PM 05/04/2023    3:27 PM 02/16/2022    9:08 AM  6CIT Screen  What Year? 4 points 4 points 4 points  What month? 3 points 0 points 3 points  What time? 0 points 3 points 0 points  Count back from 20 4 points 0 points 4 points  Months in reverse 0 points 2 points 4 points  Repeat phrase 10 points 6 points 10 points  Total Score 21 points 15 points 25 points   Relevant past medical, surgical, family and social history reviewed and updated as indicated. Interim medical history since our last visit reviewed. Allergies and medications reviewed and updated.  Review of Systems  Constitutional:  Negative for activity change, appetite change, diaphoresis, fatigue and fever.  Respiratory:  Positive for cough. Negative for chest tightness, shortness of breath and wheezing.   Cardiovascular:  Positive for leg swelling (occasional). Negative for chest pain and palpitations.  Gastrointestinal: Negative.   Endocrine: Negative for cold intolerance, heat intolerance, polydipsia, polyphagia and polyuria.  Neurological:  Negative for dizziness, syncope, weakness, light-headedness, numbness and headaches.       Memory changes  Psychiatric/Behavioral:  Positive for sleep disturbance. Negative for decreased concentration, self-injury and suicidal ideas. The patient is not nervous/anxious.    Per HPI unless specifically indicated above     Objective:    BP 126/70 (BP Location: Left Arm, Patient Position: Sitting)   Pulse 75   Temp 97.9 F  (36.6 C) (Oral)   Ht 5' 4 (1.626 m)   Wt 159 lb (72.1 kg)   SpO2 96%   BMI 27.29 kg/m   Wt Readings from Last 3 Encounters:  12/19/23 159 lb (72.1 kg)  08/14/23 171 lb 6.4 oz (77.7 kg)  05/04/23 159 lb (72.1 kg)    Physical Exam Vitals and nursing note reviewed.  Constitutional:      General: He is awake. He is not in acute distress.    Appearance: He is well-developed and well-groomed. He is not ill-appearing or toxic-appearing.  HENT:     Head: Normocephalic.     Right Ear: Hearing and external ear normal.  Left Ear: Hearing and external ear normal.  Eyes:     General: Lids are normal.     Extraocular Movements: Extraocular movements intact.     Conjunctiva/sclera: Conjunctivae normal.  Neck:     Thyroid : No thyromegaly.     Vascular: No carotid bruit.  Cardiovascular:     Rate and Rhythm: Normal rate and regular rhythm.     Heart sounds: Normal heart sounds. No murmur heard.    No gallop.  Pulmonary:     Effort: No accessory muscle usage or respiratory distress.     Breath sounds: Wheezing (intermittent expiratory wheezes throughout) present. No decreased breath sounds or rales.  Abdominal:     General: Bowel sounds are normal. There is no distension.     Palpations: Abdomen is soft.     Tenderness: There is no abdominal tenderness.  Musculoskeletal:     Cervical back: Full passive range of motion without pain.     Right lower leg: Edema (trace) present.     Left lower leg: Edema (trace) present.  Lymphadenopathy:     Cervical: No cervical adenopathy.  Skin:    General: Skin is warm.     Capillary Refill: Capillary refill takes less than 2 seconds.  Neurological:     Mental Status: He is alert. Mental status is at baseline.     Deep Tendon Reflexes: Reflexes are normal and symmetric.     Reflex Scores:      Brachioradialis reflexes are 2+ on the right side and 2+ on the left side.      Patellar reflexes are 2+ on the right side and 2+ on the left side.     Comments: Pleasantly confused per baseline.  Not oriented to month, year, or day.  Unable to state current President.  Is able to state city, state, and country + stated sister's name.  Psychiatric:        Attention and Perception: Attention normal.        Mood and Affect: Mood normal.        Speech: Speech normal.        Behavior: Behavior normal. Behavior is cooperative.        Cognition and Memory: Memory is impaired.    Results for orders placed or performed in visit on 08/14/23  CBC with Differential/Platelet   Collection Time: 08/14/23  3:19 PM  Result Value Ref Range   WBC 5.7 3.4 - 10.8 x10E3/uL   RBC 3.67 (L) 4.14 - 5.80 x10E6/uL   Hemoglobin 11.2 (L) 13.0 - 17.7 g/dL   Hematocrit 30.8 (L) 65.7 - 51.0 %   MCV 94 79 - 97 fL   MCH 30.5 26.6 - 33.0 pg   MCHC 32.7 31.5 - 35.7 g/dL   RDW 84.6 (L) 96.2 - 95.2 %   Platelets 156 150 - 450 x10E3/uL   Neutrophils 69 Not Estab. %   Lymphs 18 Not Estab. %   Monocytes 11 Not Estab. %   Eos 1 Not Estab. %   Basos 1 Not Estab. %   Neutrophils Absolute 3.9 1.4 - 7.0 x10E3/uL   Lymphocytes Absolute 1.0 0.7 - 3.1 x10E3/uL   Monocytes Absolute 0.6 0.1 - 0.9 x10E3/uL   EOS (ABSOLUTE) 0.1 0.0 - 0.4 x10E3/uL   Basophils Absolute 0.0 0.0 - 0.2 x10E3/uL   Immature Granulocytes 0 Not Estab. %   Immature Grans (Abs) 0.0 0.0 - 0.1 x10E3/uL  Comprehensive metabolic panel   Collection Time: 08/14/23  3:19 PM  Result Value Ref Range   Glucose 133 (H) 70 - 99 mg/dL   BUN 22 8 - 27 mg/dL   Creatinine, Ser 0.98 0.76 - 1.27 mg/dL   eGFR 70 >11 BJ/YNW/2.95   BUN/Creatinine Ratio 19 10 - 24   Sodium 142 134 - 144 mmol/L   Potassium 3.4 (L) 3.5 - 5.2 mmol/L   Chloride 102 96 - 106 mmol/L   CO2 27 20 - 29 mmol/L   Calcium  9.2 8.6 - 10.2 mg/dL   Total Protein 7.0 6.0 - 8.5 g/dL   Albumin 3.4 (L) 3.9 - 4.9 g/dL   Globulin, Total 3.6 1.5 - 4.5 g/dL   Bilirubin Total 0.7 0.0 - 1.2 mg/dL   Alkaline Phosphatase 101 44 - 121 IU/L   AST 39 0 - 40 IU/L    ALT 19 0 - 44 IU/L  Lipid Panel w/o Chol/HDL Ratio   Collection Time: 08/14/23  3:19 PM  Result Value Ref Range   Cholesterol, Total 116 100 - 199 mg/dL   Triglycerides 50 0 - 149 mg/dL   HDL 40 >62 mg/dL   VLDL Cholesterol Cal 12 5 - 40 mg/dL   LDL Chol Calc (NIH) 64 0 - 99 mg/dL  TSH   Collection Time: 08/14/23  3:19 PM  Result Value Ref Range   TSH 1.650 0.450 - 4.500 uIU/mL  PSA   Collection Time: 08/14/23  3:19 PM  Result Value Ref Range   Prostate Specific Ag, Serum 1.8 0.0 - 4.0 ng/mL  VITAMIN D  25 Hydroxy (Vit-D Deficiency, Fractures)   Collection Time: 08/14/23  3:19 PM  Result Value Ref Range   Vit D, 25-Hydroxy 59.4 30.0 - 100.0 ng/mL  Vitamin B12   Collection Time: 08/14/23  3:19 PM  Result Value Ref Range   Vitamin B-12 382 232 - 1,245 pg/mL  HgB A1c   Collection Time: 08/14/23  3:19 PM  Result Value Ref Range   Hgb A1c MFr Bld 6.1 (H) 4.8 - 5.6 %   Est. average glucose Bld gHb Est-mCnc 128 mg/dL  B Nat Peptide   Collection Time: 08/14/23  3:19 PM  Result Value Ref Range   BNP 1,043.7 (H) 0.0 - 100.0 pg/mL      Assessment & Plan:   Problem List Items Addressed This Visit       Cardiovascular and Mediastinum   Essential (primary) hypertension   Chronic, ongoing.  BP at goal today. Continue pill packs and collaboration with [facility] Pharm[facility].  Recommend he monitor BP at least a few mornings a week at home and document.  DASH diet at home.  Continue current medication regimen and adjust as needed.  Labs today: CBC, CMP.      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  Continue to collaborate with cardiology, recommend they schedule  follow-up.  Sister is main caregiver.  He has pill packs now, but unsure he is consistently taking medications. Continue to collaborate with [facility] Pharm[facility].      Relevant Medications   furosemide  (LASIX ) 40 MG tablet   lisinopril  (ZESTRIL ) 5 MG tablet   metoprolol  succinate (TOPROL -XL) 25 MG 24 hr tablet  atorvastatin  (LIPITOR) 40 MG tablet   Chronic systolic CHF (congestive heart failure) (HCC)   Chronic, ongoing.  Euvolemic today.  Will check BNP as past one quite elevated during acute HF.  Recommend he return to HF Clinic, he missed recent visit. Recommend: - Continue Lasix  and Metoprolol  - New referral to HF clinic placed last visit and has not attended, highly recommend he return. - Reminded to call for an overnight weight gain of >2 pounds or a weekly weight gain of >5 pounds - No adding salt to food and read food labels. Reviewed the importance of keeping daily sodium intake to 2000mg  daily.  - Avoid Ibuprofen  products      Relevant Medications   furosemide  (LASIX ) 40 MG tablet   lisinopril  (ZESTRIL ) 5 MG tablet   metoprolol  succinate (TOPROL -XL) 25 MG 24 hr tablet   atorvastatin  (LIPITOR) 40 MG tablet   Other Relevant Orders   Microalbumin, Urine Waived   B Nat Peptide     Respiratory   Centrilobular emphysema (HCC)   Relevant Medications   umeclidinium-vilanterol (ANORO ELLIPTA ) 62.5-25 MCG/ACT AEPB     Endocrine   IFG (impaired fasting glucose)   A1c 5.8% today, trend down from 6.1%, initiate medication as needed.      Relevant Orders   Bayer DCA Hb A1c Waived   Microalbumin, Urine Waived     Nervous and Auditory   Dementia without behavioral disturbance (HCC) - Primary   Chronic, progressive. Continue Exelon  per neurology and discussed with them need to schedule follow-up with neurology to ensure ongoing continuity of care.  Provided number to sister last visit.      Relevant Medications   rivastigmine  (EXELON ) 3 MG capsule   traZODone (DESYREL) 50 MG tablet      Other   Nicotine  dependence, chewing tobacco, uncomplicated   I have recommended complete cessation of tobacco use. I have discussed various options available for assistance with tobacco cessation including over the counter methods (Nicotine  gum, patch and lozenges). We also discussed prescription options (Chantix, Nicotine  Inhaler / Nasal Spray). The patient is not interested in pursuing any prescription tobacco cessation options at this time.       Hypercholesteremia   Chronic, ongoing.  Continue current medication regimen and adjust as needed.  Lipid panel today.        Relevant Medications   furosemide  (LASIX ) 40 MG tablet   lisinopril  (ZESTRIL ) 5 MG tablet   metoprolol  succinate (TOPROL -XL) 25 MG 24 hr tablet   atorvastatin  (LIPITOR) 40 MG tablet   Other Relevant Orders   Lipid Panel w/o Chol/HDL Ratio   Comprehensive metabolic panel with GFR   Clinical depression   Chronic, ongoing.  Denies SI/HI.  No current medications and scoring is stable.  Initiate medication as needed in future.  Could consider Zoloft.      Relevant Medications   traZODone (DESYREL) 50 MG tablet   Other Visit Diagnoses       Low hemoglobin       Noted past labs, recheck today and check iron and ferritin.   Relevant Orders   CBC with Differential/Platelet   Ferritin   Iron          Follow up plan: Return in about 8 weeks (around 02/13/2024) for HTN/HLD/HF, INSOMNIA, MEMORY, COPD.

## 2023-12-19 NOTE — Assessment & Plan Note (Signed)
 Chronic, progressive. Continue Exelon  per neurology and discussed with them need to schedule follow-up with neurology to ensure ongoing continuity of care.  Provided number to sister last visit.

## 2023-12-19 NOTE — Assessment & Plan Note (Addendum)
 Chronic, ongoing.  Euvolemic today.  Will check BNP as past one quite elevated during acute HF.  Recommend he return to HF Clinic, he missed recent visit. Recommend: - Continue Lasix  and Metoprolol  - New referral to HF clinic placed last visit and has not attended, highly recommend he return. - Reminded to call for an overnight weight gain of >2 pounds or a weekly weight gain of >5 pounds - No adding salt to food and read food labels. Reviewed the importance of keeping daily sodium intake to 2000mg  daily.  - Avoid Ibuprofen  products

## 2023-12-19 NOTE — Assessment & Plan Note (Signed)
 Chronic, ongoing.  BP at goal today. Continue pill packs and collaboration with [facility] Pharm[facility].  Recommend he monitor BP at least a few mornings a week at home and document.  DASH diet at home.  Continue current medication regimen and adjust as needed.  Labs today: CBC, CMP.

## 2023-12-20 ENCOUNTER — Other Ambulatory Visit: Payer: Self-pay

## 2023-12-20 ENCOUNTER — Ambulatory Visit: Payer: Self-pay | Admitting: Nurse Practitioner

## 2023-12-20 LAB — CBC WITH DIFFERENTIAL/PLATELET
Basophils Absolute: 0 10*3/uL (ref 0.0–0.2)
Basos: 1 %
EOS (ABSOLUTE): 0.1 10*3/uL (ref 0.0–0.4)
Eos: 2 %
Hematocrit: 39.1 % (ref 37.5–51.0)
Hemoglobin: 12.9 g/dL — ABNORMAL LOW (ref 13.0–17.7)
Immature Grans (Abs): 0 10*3/uL (ref 0.0–0.1)
Immature Granulocytes: 0 %
Lymphocytes Absolute: 0.9 10*3/uL (ref 0.7–3.1)
Lymphs: 25 %
MCH: 30.4 pg (ref 26.6–33.0)
MCHC: 33 g/dL (ref 31.5–35.7)
MCV: 92 fL (ref 79–97)
Monocytes Absolute: 0.5 10*3/uL (ref 0.1–0.9)
Monocytes: 14 %
Neutrophils Absolute: 2.2 10*3/uL (ref 1.4–7.0)
Neutrophils: 58 %
Platelets: 165 10*3/uL (ref 150–450)
RBC: 4.25 x10E6/uL (ref 4.14–5.80)
RDW: 12.3 % (ref 11.6–15.4)
WBC: 3.7 10*3/uL (ref 3.4–10.8)

## 2023-12-20 LAB — COMPREHENSIVE METABOLIC PANEL WITH GFR
ALT: 30 IU/L (ref 0–44)
AST: 41 IU/L — ABNORMAL HIGH (ref 0–40)
Albumin: 3.8 g/dL — ABNORMAL LOW (ref 3.9–4.9)
Alkaline Phosphatase: 124 IU/L — ABNORMAL HIGH (ref 44–121)
BUN/Creatinine Ratio: 18 (ref 10–24)
BUN: 19 mg/dL (ref 8–27)
Bilirubin Total: 0.6 mg/dL (ref 0.0–1.2)
CO2: 24 mmol/L (ref 20–29)
Calcium: 9.5 mg/dL (ref 8.6–10.2)
Chloride: 105 mmol/L (ref 96–106)
Creatinine, Ser: 1.08 mg/dL (ref 0.76–1.27)
Globulin, Total: 3.9 g/dL (ref 1.5–4.5)
Glucose: 109 mg/dL — ABNORMAL HIGH (ref 70–99)
Potassium: 4.3 mmol/L (ref 3.5–5.2)
Sodium: 142 mmol/L (ref 134–144)
Total Protein: 7.7 g/dL (ref 6.0–8.5)
eGFR: 74 mL/min/{1.73_m2} (ref >59)

## 2023-12-20 LAB — BRAIN NATRIURETIC PEPTIDE: BNP: 521.4 pg/mL — ABNORMAL HIGH (ref 0.0–100.0)

## 2023-12-20 LAB — LIPID PANEL W/O CHOL/HDL RATIO
Cholesterol, Total: 117 mg/dL (ref 100–199)
HDL: 43 mg/dL (ref >39)
LDL Chol Calc (NIH): 60 mg/dL (ref 0–99)
Triglycerides: 63 mg/dL (ref 0–149)
VLDL Cholesterol Cal: 14 mg/dL (ref 5–40)

## 2023-12-20 LAB — IRON: Iron: 68 ug/dL (ref 38–169)

## 2023-12-20 LAB — FERRITIN: Ferritin: 137 ng/mL (ref 30–400)

## 2023-12-20 NOTE — Progress Notes (Signed)
 Good morning, please let Lanell know his labs have returned: - BNP, heart lab, remains elevated with his heart failure but less than previous check.  I do highly recommend he return to heart failure clinic as we discussed last visit and I placed referral for.  The [facility] could help you get transportation there. - Kidney and liver function overall stable. - Lipid panel shows levels at goal. - CBC, blood counts, shows improving levels and iron level is normal.  Please continue all medications and ensure to take daily.  Any questions? Keep being amazing!!  Thank you for allowing me to participate in your care.  I appreciate you. Kindest regards, Lochlann Mastrangelo

## 2023-12-21 ENCOUNTER — Other Ambulatory Visit: Payer: Self-pay

## 2023-12-24 ENCOUNTER — Other Ambulatory Visit: Payer: Self-pay

## 2023-12-31 ENCOUNTER — Other Ambulatory Visit: Payer: Self-pay

## 2024-01-15 IMAGING — CR DG KNEE COMPLETE 4+V*R*
4 series · 4 of 4 positions shown · non-contrast
Comparison: Right knee x-rays dated March 03, 2021.

CLINICAL DATA: Right knee pain, swelling, and redness.

EXAM:
RIGHT KNEE - COMPLETE 4+ VIEW

[knee ap]
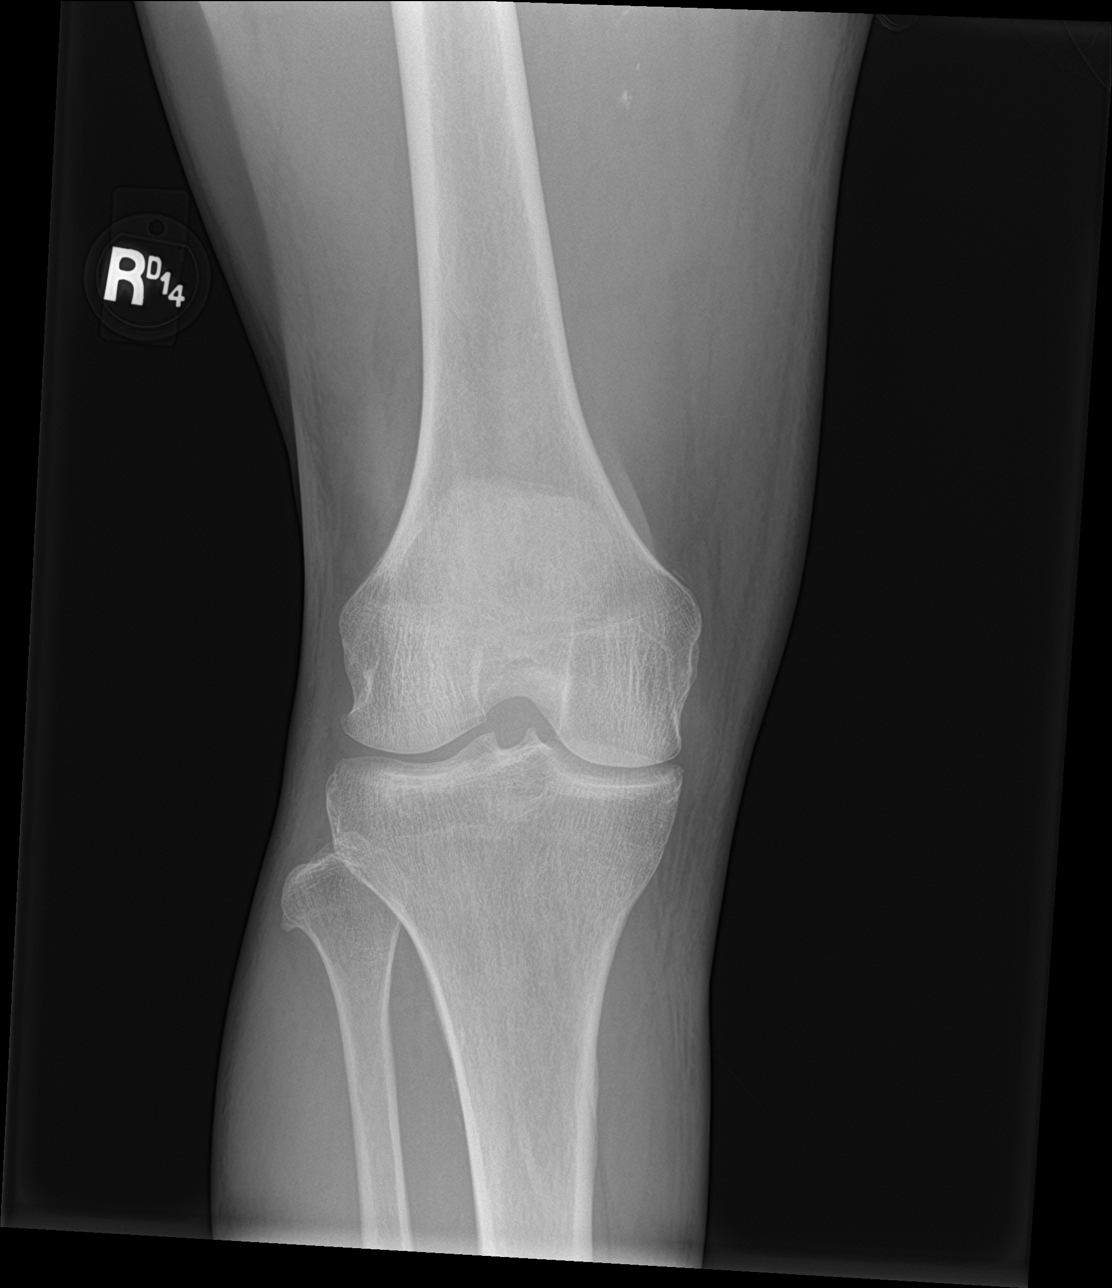

[knee obl (1 of 2)]
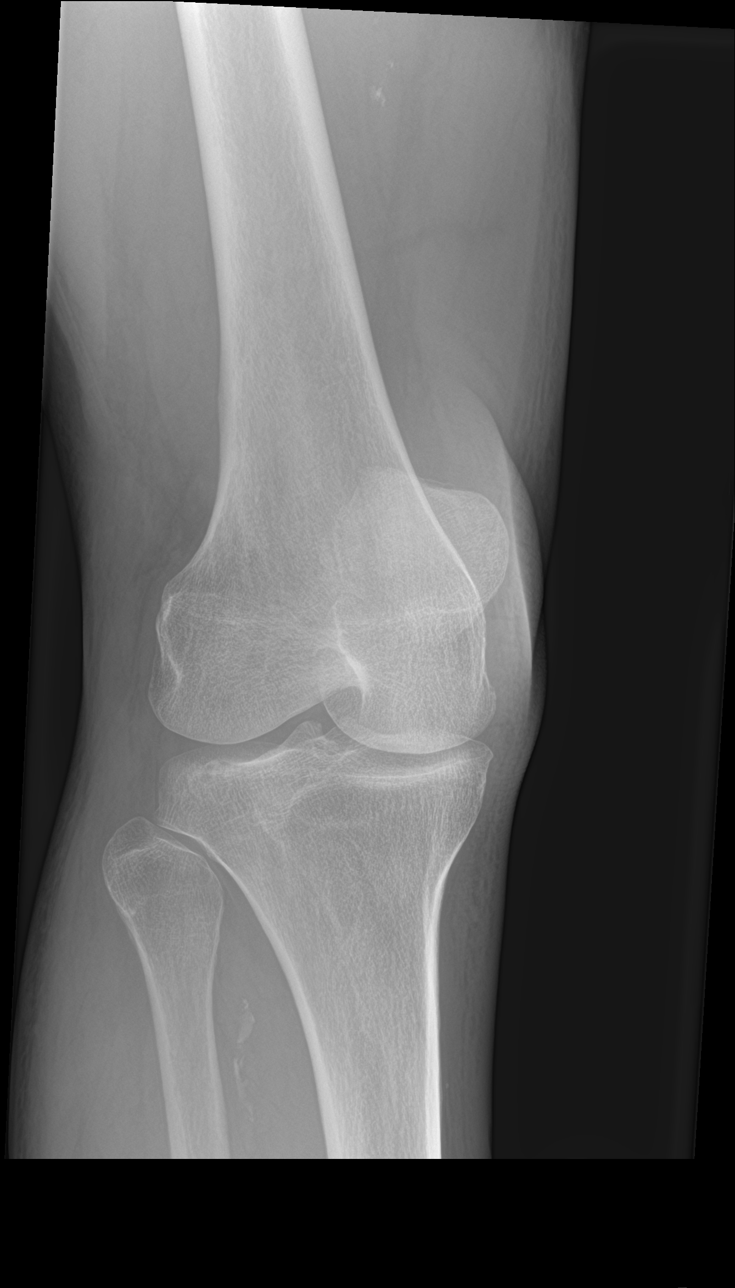

[knee obl (2 of 2)]
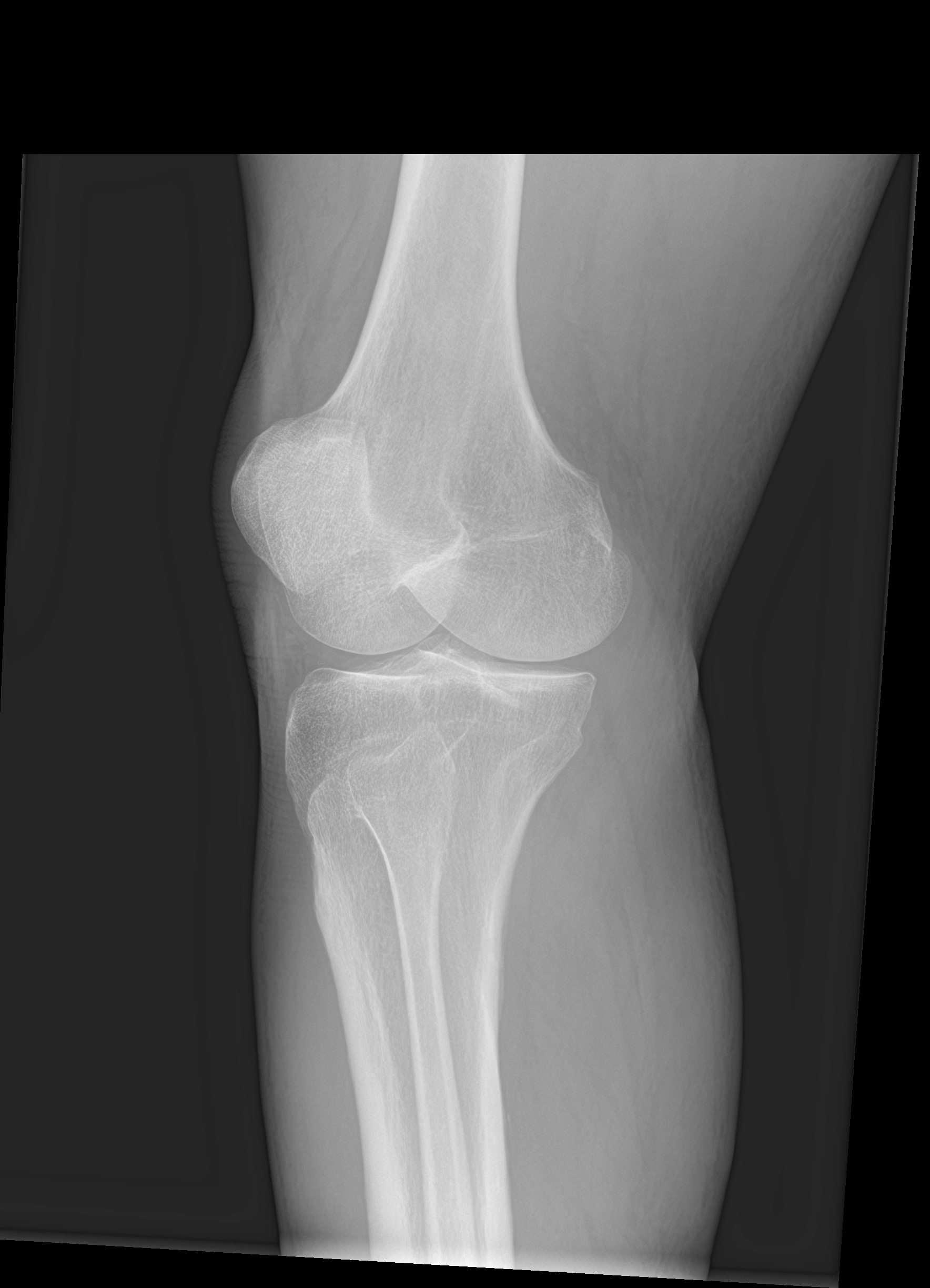

[knee lat]
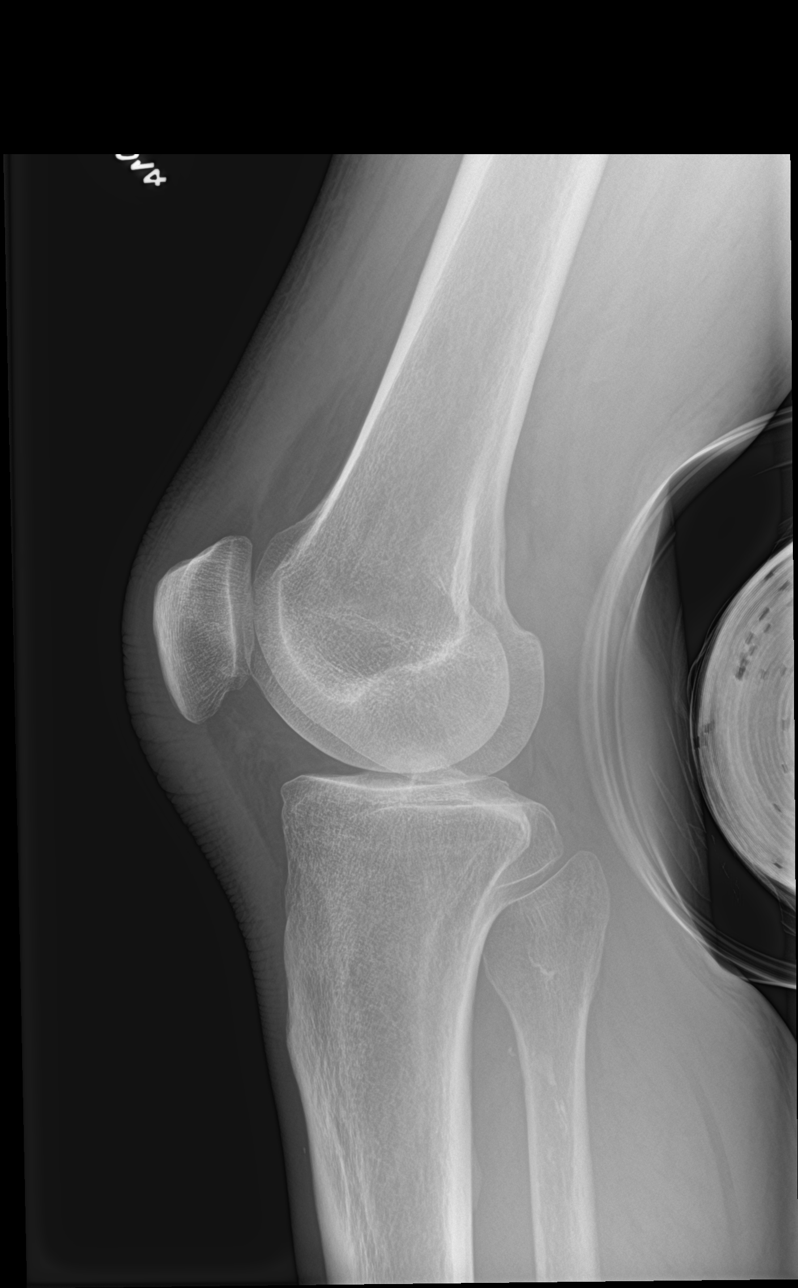

[4 of 4 positions shown; findings below may reference images not displayed]

FINDINGS: No acute fracture or dislocation. No joint effusion. Unchanged mild
medial compartment joint space narrowing. Bone mineralization is
normal. Unchanged diffuse soft tissue swelling.
IMPRESSION: 1. No acute osseous abnormality.

## 2024-01-21 ENCOUNTER — Other Ambulatory Visit: Payer: Self-pay

## 2024-01-23 ENCOUNTER — Other Ambulatory Visit: Payer: Self-pay

## 2024-01-24 ENCOUNTER — Other Ambulatory Visit: Payer: Self-pay

## 2024-01-25 ENCOUNTER — Other Ambulatory Visit: Payer: Self-pay

## 2024-01-31 ENCOUNTER — Ambulatory Visit: Payer: Self-pay

## 2024-01-31 ENCOUNTER — Other Ambulatory Visit: Payer: Self-pay

## 2024-01-31 NOTE — Telephone Encounter (Signed)
 FYI Only or Action Required?: FYI only for provider.  Patient was last seen in primary care on 12/19/2023 by Valerio Melanie DASEN, NP.  Called Nurse Triage reporting No chief complaint on file..  Symptoms began several days ago.  Interventions attempted: Nothing.  Symptoms are: gradually worsening.  Triage Disposition: See HCP Within 4 Hours (Or PCP Triage)  Patient/caregiver understands and will follow disposition?: Yes   Copied from CRM #[id number]. Topic: Clinical - Red Word Triage >> Jan 31, 2024  8:13 AM Vena H wrote: Red Word that prompted transfer to Nurse Triage: Pt's sister [name] called in stating the pt is having really bad stomach pain and is a 9/10 Reason for Disposition  [1] MILD-MODERATE pain AND [2] constant AND [3] age > 60 years  Answer Assessment - Initial Assessment Questions 1. LOCATION: Where does it hurt?      Left Upper Abdominal Area  2. RADIATION: Does the pain shoot anywhere else? (e.g., chest, back)     No  3. ONSET: When did the pain begin? (Minutes, hours or days ago)      2 Days ago  4. SUDDEN: Gradual or sudden onset?     Gradual  5. PATTERN Does the pain come and go, or is it constant?     Intermittent  6. SEVERITY: How bad is the pain?  (e.g., Scale 1-10; mild, moderate, or severe)     Moderate  7. RECURRENT SYMPTOM: Have you ever had this type of stomach pain before? If Yes, ask: When was the last time? and What happened that time?      Yes  8. CAUSE: What do you think is causing the stomach pain? (e.g., gallstones, recent abdominal surgery)     Unsure  9. RELIEVING/AGGRAVATING FACTORS: What makes it better or worse? (e.g., antacids, bending or twisting motion, bowel movement)     Unable to lay on the left side.   10. OTHER SYMPTOMS: Do you have any other symptoms? (e.g., back pain, diarrhea, fever, urination pain, vomiting)       No  Protocols used: Abdominal Pain - Male-A-AH

## 2024-02-08 ENCOUNTER — Encounter: Payer: Self-pay | Admitting: Intensive Care

## 2024-02-08 ENCOUNTER — Telehealth: Payer: Self-pay | Admitting: Emergency Medicine

## 2024-02-08 ENCOUNTER — Ambulatory Visit: Admitting: Nurse Practitioner

## 2024-02-08 ENCOUNTER — Other Ambulatory Visit: Payer: Self-pay

## 2024-02-08 ENCOUNTER — Emergency Department
Admission: EM | Admit: 2024-02-08 | Discharge: 2024-02-08 | Discharge status: Against Medical Advice | Attending: Emergency Medicine | Admitting: Emergency Medicine

## 2024-02-08 ENCOUNTER — Emergency Department

## 2024-02-08 DIAGNOSIS — R0602 Shortness of breath: Secondary | ICD-10-CM | POA: Insufficient documentation

## 2024-02-08 DIAGNOSIS — R079 Chest pain, unspecified: Secondary | ICD-10-CM | POA: Diagnosis not present

## 2024-02-08 DIAGNOSIS — R059 Cough, unspecified: Secondary | ICD-10-CM | POA: Diagnosis not present

## 2024-02-08 DIAGNOSIS — Z5321 Procedure and treatment not carried out due to patient leaving prior to being seen by health care provider: Secondary | ICD-10-CM | POA: Insufficient documentation

## 2024-02-08 DIAGNOSIS — Z20822 Contact with and (suspected) exposure to covid-19: Secondary | ICD-10-CM | POA: Diagnosis not present

## 2024-02-08 DIAGNOSIS — I509 Heart failure, unspecified: Secondary | ICD-10-CM | POA: Insufficient documentation

## 2024-02-08 DIAGNOSIS — R0989 Other specified symptoms and signs involving the circulatory and respiratory systems: Secondary | ICD-10-CM | POA: Insufficient documentation

## 2024-02-08 LAB — CBC
HCT: 44.6 % (ref 39.0–52.0)
Hemoglobin: 13.9 g/dL (ref 13.0–17.0)
MCH: 30 pg (ref 26.0–34.0)
MCHC: 31.2 g/dL (ref 30.0–36.0)
MCV: 96.3 fL (ref 80.0–100.0)
Platelets: 215 K/uL (ref 150–400)
RBC: 4.63 MIL/uL (ref 4.22–5.81)
RDW: 13.1 % (ref 11.5–15.5)
WBC: 4.2 K/uL (ref 4.0–10.5)
nRBC: 0 % (ref 0.0–0.2)

## 2024-02-08 LAB — BASIC METABOLIC PANEL WITH GFR
Anion gap: 6 (ref 5–15)
BUN: 20 mg/dL (ref 8–23)
CO2: 28 mmol/L (ref 22–32)
Calcium: 9 mg/dL (ref 8.9–10.3)
Chloride: 106 mmol/L (ref 98–111)
Creatinine, Ser: 0.68 mg/dL (ref 0.61–1.24)
GFR, Estimated: >60 mL/min (ref >60)
Glucose, Bld: 96 mg/dL (ref 70–99)
Potassium: 4.1 mmol/L (ref 3.5–5.1)
Sodium: 140 mmol/L (ref 135–145)

## 2024-02-08 LAB — TROPONIN I (HIGH SENSITIVITY): Troponin I (High Sensitivity): 22 ng/L — ABNORMAL HIGH (ref <18)

## 2024-02-08 NOTE — ED Notes (Signed)
 Patient states she is leaving and going to [facility] due to wait time. Patient was encouraged to stay but chose to leave.

## 2024-02-08 NOTE — ED Triage Notes (Signed)
 Patient brought over from [facility]. Congestion, cough and runny nose 1.5 weeks. Experiencing more sob and cp. Reports he cannot lie down flat due to sob  Wife gave nitroglycerin  at home with some relief. Wife reports he does have intermittent chest pain which is why he has nitroglycerin  at home.  Reports other people in house have been sick   History CHF

## 2024-02-08 NOTE — Telephone Encounter (Signed)
 Called patient due to left emergency department before provider exam to inquire about condition and follow up plans. Went to [facility], but VM is full.

## 2024-02-15 ENCOUNTER — Other Ambulatory Visit: Payer: Self-pay

## 2024-02-16 NOTE — Patient Instructions (Incomplete)

## 2024-02-20 ENCOUNTER — Ambulatory Visit: Admitting: Nurse Practitioner

## 2024-02-26 ENCOUNTER — Other Ambulatory Visit: Payer: Self-pay

## 2024-02-27 ENCOUNTER — Other Ambulatory Visit: Payer: Self-pay

## 2024-03-20 ENCOUNTER — Other Ambulatory Visit: Payer: Self-pay

## 2024-03-27 ENCOUNTER — Other Ambulatory Visit: Payer: Self-pay

## 2024-03-28 ENCOUNTER — Other Ambulatory Visit: Payer: Self-pay

## 2024-04-21 ENCOUNTER — Other Ambulatory Visit: Payer: Self-pay

## 2024-04-29 ENCOUNTER — Other Ambulatory Visit: Payer: Self-pay

## 2024-04-30 ENCOUNTER — Other Ambulatory Visit: Payer: Self-pay

## 2024-05-02 ENCOUNTER — Other Ambulatory Visit: Payer: Self-pay

## 2024-05-05 ENCOUNTER — Other Ambulatory Visit: Payer: Self-pay

## 2024-05-06 ENCOUNTER — Other Ambulatory Visit: Payer: Self-pay

## 2024-05-07 ENCOUNTER — Other Ambulatory Visit: Payer: Self-pay

## 2024-05-07 ENCOUNTER — Ambulatory Visit (INDEPENDENT_AMBULATORY_CARE_PROVIDER_SITE_OTHER): Admitting: Pediatrics

## 2024-05-07 ENCOUNTER — Encounter: Payer: Self-pay | Admitting: Pediatrics

## 2024-05-07 VITALS — BP 156/76 | HR 68 | Temp 97.8°F | Resp 14 | Ht 67.01 in | Wt 177.0 lb

## 2024-05-07 DIAGNOSIS — M21961 Unspecified acquired deformity of right lower leg: Secondary | ICD-10-CM

## 2024-05-07 DIAGNOSIS — M21962 Unspecified acquired deformity of left lower leg: Secondary | ICD-10-CM | POA: Diagnosis not present

## 2024-05-07 DIAGNOSIS — I509 Heart failure, unspecified: Secondary | ICD-10-CM | POA: Diagnosis not present

## 2024-05-07 DIAGNOSIS — R0602 Shortness of breath: Secondary | ICD-10-CM

## 2024-05-07 DIAGNOSIS — J432 Centrilobular emphysema: Secondary | ICD-10-CM

## 2024-05-07 MED ORDER — PREDNISONE 20 MG PO TABS: 40.0000 mg | ORAL_TABLET | Freq: Every day | ORAL | 0 refills | Status: AC

## 2024-05-07 MED ORDER — UMECLIDINIUM-VILANTEROL 62.5-25 MCG/ACT IN AEPB: 1.0000 | INHALATION_SPRAY | Freq: Every day | RESPIRATORY_TRACT | 4 refills | Status: DC

## 2024-05-07 MED ORDER — DOXYCYCLINE HYCLATE 100 MG PO TABS: 100.0000 mg | ORAL_TABLET | Freq: Two times a day (BID) | ORAL | 0 refills | Status: AC

## 2024-05-07 MED ORDER — ALBUTEROL SULFATE HFA 108 (90 BASE) MCG/ACT IN AERS: 2.0000 | INHALATION_SPRAY | Freq: Four times a day (QID) | RESPIRATORY_TRACT | 2 refills | Status: DC | PRN

## 2024-05-07 MED ORDER — FUROSEMIDE 80 MG PO TABS: 80.0000 mg | ORAL_TABLET | Freq: Every day | ORAL | 0 refills | Status: DC

## 2024-05-07 NOTE — Progress Notes (Signed)
 Office Visit  BP (!) 156/76 (BP Location: Left Arm, Patient Position: Sitting, Cuff Size: Normal)   Pulse 68   Temp 97.8 F (36.6 C) (Oral)   Resp 14   Ht 5' 7.01 (1.702 m)   Wt 177 lb (80.3 kg)   SpO2 96%   BMI 27.72 kg/m    Subjective:    Patient ID: Blake [name], male    DOB: Oct 08, 1955, 68 y.o.   MRN: [id number]  HPI: Blake [name] is a 68 y.o. male  Chief Complaint  Patient presents with   Wheezing    About a week. Runny nose, coughing, can't breathe, dizziness when supine, chest pressure a bit, No fevers. Legs swelling both.     Discussed the use of AI scribe software for clinical note transcription with the patient, who gave verbal consent to proceed.  History of Present Illness   Blake [name] is a 68 year old male with COPD who presents with shortness of breath and swelling.  He has been experiencing shortness of breath for the past week, accompanied by dizziness and frequent coughing. He has a history of COPD and has run out of his inhalers.  He reports swelling in his legs, which has affected his ability to wear shoes comfortably. His caregiver notes that he has been wearing his shoes in a way that suggests significant swelling. He continues to experience symptoms of fluid overload, including increased shortness of breath when lying down. He also mentions experiencing mild chest pain.  He is currently taking furosemide  (Lasix ) for fluid management at a dose of 40 mg. Prior to this episode, his symptoms were well-managed.     Relevant past medical, surgical, family and social history reviewed and updated as indicated. Interim medical history since our last visit reviewed. Allergies and medications reviewed and updated.  ROS per HPI unless specifically indicated above     Objective:    BP (!) 156/76 (BP Location: Left Arm, Patient Position: Sitting, Cuff Size: Normal)   Pulse 68   Temp 97.8 F (36.6 C) (Oral)   Resp 14   Ht 5' 7.01 (1.702  m)   Wt 177 lb (80.3 kg)   SpO2 96%   BMI 27.72 kg/m   Wt Readings from Last 3 Encounters:  05/07/24 177 lb (80.3 kg)  12/19/23 159 lb (72.1 kg)  08/14/23 171 lb 6.4 oz (77.7 kg)     Physical Exam Constitutional:      General: He is not in acute distress.    Appearance: Normal appearance.  Pulmonary:     Effort: Pulmonary effort is normal. No respiratory distress.     Breath sounds: Rales present.     Comments: Rales present throughout, possible crakles at the bases bilaterally.  Musculoskeletal:        General: Normal range of motion.     Right lower leg: Edema present.     Left lower leg: Edema present.     Comments: 2+pitting edema to distal shin bilaterally  Skin:    Comments: Normal skin color  Neurological:     General: No focal deficit present.     Mental Status: He is alert. Mental status is at baseline.  Psychiatric:        Mood and Affect: Mood normal.        Behavior: Behavior normal.        Thought Content: Thought content normal.         12/19/2023    2:33 PM 08/14/2023  5:07 PM 05/04/2023    3:25 PM 11/15/2022   11:33 AM 02/16/2022    9:12 AM  Depression screen PHQ 2/9  Decreased Interest 0 1 1 0 0  Down, Depressed, Hopeless 0 1 1 0 0  PHQ - 2 Score 0 2 2 0 0  Altered sleeping 2 1 0 0   Tired, decreased energy 0 2 1 0   Change in appetite 0 0 0 0   Feeling bad or failure about yourself  0 0 0 0   Trouble concentrating 0 0  0   Moving slowly or fidgety/restless 0  0 0   Suicidal thoughts 0 0 0 0   PHQ-9 Score 2 5 3  0   Difficult doing work/chores  Not difficult at all  Not difficult at all        12/19/2023    2:34 PM 08/14/2023    5:08 PM 05/04/2023    3:26 PM 11/15/2022   11:33 AM  GAD 7 : Generalized Anxiety Score  Nervous, Anxious, on Edge 0 0 1 0  Control/stop worrying 0 0 0 0  Worry too much - different things 0 0 0 0  Trouble relaxing 0 0 1 0  Restless 0 0 0 0  Easily annoyed or irritable 0 0 0 0  Afraid - awful might happen 0 0 0 0   Total GAD 7 Score 0 0 2 0  Anxiety Difficulty Not difficult at all Not difficult at all Not difficult at all Not difficult at all       Assessment & Plan:  Assessment & Plan   SOB (shortness of breath) Suspect multifactorial etiology. Character of symptoms appears to be COPD exacerbation with possible HF exacerbation as well. Will send for labs and plan to treat for both. See discussion below.  -     CBC with Differential/Platelet -     Brain natriuretic peptide -     Basic metabolic panel with GFR -     Magnesium  Centrilobular emphysema (HCC) Suspect acute exacerbation likely contributing to respiratory symptoms. Tx w pred and abx. - Send refills for inhalers to manage COPD symptoms.  -     Albuterol  Sulfate HFA; Inhale 2 puffs into the lungs every 6 (six) hours as needed.  Dispense: 6.7 g; Refill: 2 -     Umeclidinium-Vilanterol; Inhale 1 puff into the lungs daily at 6 (six) AM.  Dispense: 60 each; Refill: 4 -     Doxycycline Hyclate; Take 1 tablet (100 mg total) by mouth 2 (two) times daily for 5 days.  Dispense: 10 tablet; Refill: 0 -     predniSONE ; Take 2 tablets (40 mg total) by mouth daily with breakfast for 5 days.  Dispense: 10 tablet; Refill: 0  Acute exacerbation of chronic heart failure (HCC) Increased fluid retention suspected given elevated bp, orthopnea, PND and edematous lower extremities, suspect contributing to SOB as above. Pt given strict return precautions and anticipatory guidance for ED. - Increase furosemide  to 80 mg for a few days to reduce fluid overload. - Refer to cardiologist for further management (has missed multiple follow up visits) but may benefit from HF clinic if still volume up next week - Order labs to assess overall health status. -     Furosemide ; Take 1 tablet (80 mg total) by mouth daily for 7 days.  Dispense: 10 tablet; Refill: 0  Deformity of both feet Requesting new shoe fitting. -     Ambulatory referral to  Podiatry   Follow up  plan: Return in about 1 week (around 05/14/2024) for sob.  Hadassah SHAUNNA Nett, MD

## 2024-05-07 NOTE — Patient Instructions (Signed)
 Plan: - Taking furosemide  (lasix ) 80mg  starting today until your next visit. I sent the new prescription to your pharmacy.  - Start doxycyline 100mg  twice daily for 5 days - Start prednisone  40mg  for 5 days  Return in 1 weeks.   Please seek medical attention in the Emergency Department immediately if you develop persistant or increased chest pain, shortness of breath, fainting spells, sudden sweatiness, pain radiating to your back, coughing up blood, leg swelling, fevers greater than 100.57F that do not resolve with Tylenol , or any other symptoms that concern you.

## 2024-05-09 ENCOUNTER — Ambulatory Visit: Payer: Self-pay | Admitting: Pediatrics

## 2024-05-10 LAB — BASIC METABOLIC PANEL WITH GFR
BUN/Creatinine Ratio: 19 (ref 10–24)
BUN: 20 mg/dL (ref 8–27)
CO2: 28 mmol/L (ref 20–29)
Calcium: 9.7 mg/dL (ref 8.6–10.2)
Chloride: 100 mmol/L (ref 96–106)
Creatinine, Ser: 1.07 mg/dL (ref 0.76–1.27)
Glucose: 113 mg/dL — ABNORMAL HIGH (ref 70–99)
Potassium: 4.4 mmol/L (ref 3.5–5.2)
Sodium: 142 mmol/L (ref 134–144)
eGFR: 75 mL/min/1.73 (ref >59)

## 2024-05-10 LAB — CBC WITH DIFFERENTIAL/PLATELET
Basophils Absolute: 0 x10E3/uL (ref 0.0–0.2)
Basos: 1 %
EOS (ABSOLUTE): 0.1 x10E3/uL (ref 0.0–0.4)
Eos: 1 %
Hematocrit: 40.8 % (ref 37.5–51.0)
Hemoglobin: 12.8 g/dL — ABNORMAL LOW (ref 13.0–17.7)
Immature Grans (Abs): 0 x10E3/uL (ref 0.0–0.1)
Immature Granulocytes: 0 %
Lymphocytes Absolute: 0.8 x10E3/uL (ref 0.7–3.1)
Lymphs: 18 %
MCH: 30.3 pg (ref 26.6–33.0)
MCHC: 31.4 g/dL — ABNORMAL LOW (ref 31.5–35.7)
MCV: 97 fL (ref 79–97)
Monocytes Absolute: 0.5 x10E3/uL (ref 0.1–0.9)
Monocytes: 11 %
Neutrophils Absolute: 3 x10E3/uL (ref 1.4–7.0)
Neutrophils: 69 %
Platelets: 167 x10E3/uL (ref 150–450)
RBC: 4.22 x10E6/uL (ref 4.14–5.80)
RDW: 11.9 % (ref 11.6–15.4)
WBC: 4.3 x10E3/uL (ref 3.4–10.8)

## 2024-05-10 LAB — MAGNESIUM: Magnesium: 1.6 mg/dL (ref 1.6–2.3)

## 2024-05-10 LAB — BRAIN NATRIURETIC PEPTIDE: BNP: 2037.7 pg/mL — ABNORMAL HIGH (ref 0.0–100.0)

## 2024-05-10 NOTE — Patient Instructions (Signed)
 Heart Failure Action Plan A heart failure action plan helps you know what to do when you have symptoms of heart failure. Your action plan is a color-coded plan that lists the symptoms to watch for and indicates what actions to take. If you have symptoms in the green zone, you're doing well. If you have symptoms in the yellow zone, you're having problems. If you have symptoms in the red zone, you need medical care right away. Follow the plan that was created by you and your health care provider. Review your plan each time you visit your provider. Green zone These signs mean you're doing well and can continue what you're doing: You don't have new or worsening shortness of breath. You have very little swelling or no new swelling. Your weight is stable (no gain or loss). You have a normal activity level. You don't have chest pain or any other new symptoms. Yellow zone These signs and symptoms mean your condition may be getting worse and you should make some changes: You have trouble breathing when you're active. You have swelling in your feet or legs or have discomfort in your belly. You gain 2-3 lb (0.9-1.4 kg) in 24 hours, or 5 lb (2.3 kg) in a week. This amount may be more or less depending on your condition. You get tired easily. You have trouble sleeping. You have a dry cough. If you have any of these symptoms: Contact your provider within the next day. Your provider may adjust your medicines. Red zone These signs and symptoms mean you should get medical help right away: You have trouble breathing when resting or cannot lie flat and you need to raise your head to help you breathe. You have a dry cough that's getting worse. You have swelling or pain in your feet or legs or discomfort in your belly that's getting worse. You suddenly gain more than 2-3 lb (0.9-1.4 kg) in 24 hours, or more than 5 lb (2.3 kg) in a week. This amount may be more or less depending on your condition. You have  trouble staying awake or you feel confused. You don't have an appetite. You have worsening sadness or depression. These symptoms may be an emergency. Call 911 right away. Do not wait to see if the symptoms will go away. Do not drive yourself to the hospital. Follow these instructions at home: Take medicines only as told. Eat a heart-healthy diet. Work with a dietitian to create an eating plan that's best for you. Weigh yourself each day. Your target weight is __________ lb (__________ kg). Call your provider if you gain more than __________ lb (__________ kg) in 24 hours, or more than __________ lb (__________ kg) in a week. Health care provider name: _____________________________________________________ Health care provider phone number: _____________________________________________________ Where to find more information American Heart Association: heart.org This information is not intended to replace advice given to you by your health care provider. Make sure you discuss any questions you have with your health care provider. Document Revised: 02/08/2023 Document Reviewed: 02/08/2023 Elsevier Patient Education  2024 [employer].

## 2024-05-11 ENCOUNTER — Encounter: Payer: Self-pay | Admitting: Pediatrics

## 2024-05-14 ENCOUNTER — Inpatient Hospital Stay
Admission: EM | Admit: 2024-05-14 | Discharge: 2024-05-18 | DRG: 291 | Disposition: A | Discharge status: Home Health | Attending: Internal Medicine | Admitting: Internal Medicine

## 2024-05-14 ENCOUNTER — Other Ambulatory Visit: Payer: Self-pay

## 2024-05-14 ENCOUNTER — Emergency Department

## 2024-05-14 ENCOUNTER — Ambulatory Visit (INDEPENDENT_AMBULATORY_CARE_PROVIDER_SITE_OTHER): Admitting: Nurse Practitioner

## 2024-05-14 ENCOUNTER — Encounter: Payer: Self-pay | Admitting: Nurse Practitioner

## 2024-05-14 VITALS — BP 149/72 | HR 70 | Temp 98.3°F | Ht 64.0 in | Wt 168.4 lb

## 2024-05-14 DIAGNOSIS — Z955 Presence of coronary angioplasty implant and graft: Secondary | ICD-10-CM

## 2024-05-14 DIAGNOSIS — J9622 Acute and chronic respiratory failure with hypercapnia: Secondary | ICD-10-CM | POA: Diagnosis present

## 2024-05-14 DIAGNOSIS — N401 Enlarged prostate with lower urinary tract symptoms: Secondary | ICD-10-CM | POA: Diagnosis present

## 2024-05-14 DIAGNOSIS — Z604 Social exclusion and rejection: Secondary | ICD-10-CM | POA: Diagnosis present

## 2024-05-14 DIAGNOSIS — I252 Old myocardial infarction: Secondary | ICD-10-CM

## 2024-05-14 DIAGNOSIS — J432 Centrilobular emphysema: Secondary | ICD-10-CM

## 2024-05-14 DIAGNOSIS — J9601 Acute respiratory failure with hypoxia: Secondary | ICD-10-CM | POA: Diagnosis not present

## 2024-05-14 DIAGNOSIS — Z5941 Food insecurity: Secondary | ICD-10-CM | POA: Diagnosis not present

## 2024-05-14 DIAGNOSIS — I440 Atrioventricular block, first degree: Secondary | ICD-10-CM | POA: Diagnosis present

## 2024-05-14 DIAGNOSIS — F17290 Nicotine dependence, other tobacco product, uncomplicated: Secondary | ICD-10-CM | POA: Diagnosis present

## 2024-05-14 DIAGNOSIS — I110 Hypertensive heart disease with heart failure: Principal | ICD-10-CM | POA: Diagnosis present

## 2024-05-14 DIAGNOSIS — R413 Other amnesia: Secondary | ICD-10-CM | POA: Diagnosis present

## 2024-05-14 DIAGNOSIS — Z1152 Encounter for screening for COVID-19: Secondary | ICD-10-CM

## 2024-05-14 DIAGNOSIS — Z951 Presence of aortocoronary bypass graft: Secondary | ICD-10-CM

## 2024-05-14 DIAGNOSIS — J9602 Acute respiratory failure with hypercapnia: Secondary | ICD-10-CM

## 2024-05-14 DIAGNOSIS — J9692 Respiratory failure, unspecified with hypercapnia: Secondary | ICD-10-CM | POA: Diagnosis present

## 2024-05-14 DIAGNOSIS — Z7951 Long term (current) use of inhaled steroids: Secondary | ICD-10-CM

## 2024-05-14 DIAGNOSIS — I255 Ischemic cardiomyopathy: Secondary | ICD-10-CM | POA: Diagnosis present

## 2024-05-14 DIAGNOSIS — I2489 Other forms of acute ischemic heart disease: Secondary | ICD-10-CM | POA: Diagnosis present

## 2024-05-14 DIAGNOSIS — G9341 Metabolic encephalopathy: Secondary | ICD-10-CM | POA: Diagnosis present

## 2024-05-14 DIAGNOSIS — I5021 Acute systolic (congestive) heart failure: Secondary | ICD-10-CM | POA: Diagnosis not present

## 2024-05-14 DIAGNOSIS — F17210 Nicotine dependence, cigarettes, uncomplicated: Secondary | ICD-10-CM | POA: Diagnosis present

## 2024-05-14 DIAGNOSIS — Z79899 Other long term (current) drug therapy: Secondary | ICD-10-CM | POA: Diagnosis not present

## 2024-05-14 DIAGNOSIS — Z720 Tobacco use: Secondary | ICD-10-CM | POA: Diagnosis present

## 2024-05-14 DIAGNOSIS — E785 Hyperlipidemia, unspecified: Secondary | ICD-10-CM | POA: Diagnosis present

## 2024-05-14 DIAGNOSIS — I5023 Acute on chronic systolic (congestive) heart failure: Secondary | ICD-10-CM | POA: Diagnosis present

## 2024-05-14 DIAGNOSIS — N138 Other obstructive and reflux uropathy: Secondary | ICD-10-CM | POA: Diagnosis present

## 2024-05-14 DIAGNOSIS — Z7982 Long term (current) use of aspirin: Secondary | ICD-10-CM | POA: Diagnosis not present

## 2024-05-14 DIAGNOSIS — K219 Gastro-esophageal reflux disease without esophagitis: Secondary | ICD-10-CM | POA: Diagnosis present

## 2024-05-14 DIAGNOSIS — I509 Heart failure, unspecified: Secondary | ICD-10-CM

## 2024-05-14 DIAGNOSIS — J441 Chronic obstructive pulmonary disease with (acute) exacerbation: Secondary | ICD-10-CM | POA: Diagnosis present

## 2024-05-14 DIAGNOSIS — J40 Bronchitis, not specified as acute or chronic: Secondary | ICD-10-CM | POA: Diagnosis present

## 2024-05-14 DIAGNOSIS — J439 Emphysema, unspecified: Secondary | ICD-10-CM | POA: Diagnosis present

## 2024-05-14 DIAGNOSIS — J9621 Acute and chronic respiratory failure with hypoxia: Secondary | ICD-10-CM | POA: Diagnosis present

## 2024-05-14 DIAGNOSIS — I2510 Atherosclerotic heart disease of native coronary artery without angina pectoris: Secondary | ICD-10-CM | POA: Diagnosis present

## 2024-05-14 DIAGNOSIS — I5022 Chronic systolic (congestive) heart failure: Secondary | ICD-10-CM | POA: Diagnosis present

## 2024-05-14 DIAGNOSIS — Z716 Tobacco abuse counseling: Secondary | ICD-10-CM

## 2024-05-14 DIAGNOSIS — Z8249 Family history of ischemic heart disease and other diseases of the circulatory system: Secondary | ICD-10-CM

## 2024-05-14 LAB — COMPREHENSIVE METABOLIC PANEL WITH GFR
ALT: 18 U/L (ref 0–44)
AST: 33 U/L (ref 15–41)
Albumin: 3 g/dL — ABNORMAL LOW (ref 3.5–5.0)
Alkaline Phosphatase: 93 U/L (ref 38–126)
Anion gap: 9 (ref 5–15)
BUN: 25 mg/dL — ABNORMAL HIGH (ref 8–23)
CO2: 34 mmol/L — ABNORMAL HIGH (ref 22–32)
Calcium: 8.9 mg/dL (ref 8.9–10.3)
Chloride: 97 mmol/L — ABNORMAL LOW (ref 98–111)
Creatinine, Ser: 1.09 mg/dL (ref 0.61–1.24)
GFR, Estimated: >60 mL/min (ref >60)
Glucose, Bld: 106 mg/dL — ABNORMAL HIGH (ref 70–99)
Potassium: 3.7 mmol/L (ref 3.5–5.1)
Sodium: 140 mmol/L (ref 135–145)
Total Bilirubin: 0.6 mg/dL (ref 0.0–1.2)
Total Protein: 8 g/dL (ref 6.5–8.1)

## 2024-05-14 LAB — BLOOD GAS, VENOUS
Acid-Base Excess: 16.5 mmol/L — ABNORMAL HIGH (ref 0.0–2.0)
Acid-Base Excess: 14.2 mmol/L — ABNORMAL HIGH (ref 0.0–2.0)
Bicarbonate: 45.2 mmol/L — ABNORMAL HIGH (ref 20.0–28.0)
Bicarbonate: 41 mmol/L — ABNORMAL HIGH (ref 20.0–28.0)
O2 Saturation: 26.3 %
O2 Saturation: 85.1 %
Patient temperature: 37
Patient temperature: 37
pCO2, Ven: 73 mmHg (ref 44–60)
pCO2, Ven: 59 mmHg (ref 44–60)
pH, Ven: 7.4 (ref 7.25–7.43)
pH, Ven: 7.45 — ABNORMAL HIGH (ref 7.25–7.43)
pO2, Ven: 52 mmHg — ABNORMAL HIGH (ref 32–45)

## 2024-05-14 LAB — CBC WITH DIFFERENTIAL/PLATELET
Abs Immature Granulocytes: 0.02 K/uL (ref 0.00–0.07)
Basophils Absolute: 0 K/uL (ref 0.0–0.1)
Basophils Relative: 0 %
Eosinophils Absolute: 0.1 K/uL (ref 0.0–0.5)
Eosinophils Relative: 2 %
HCT: 41 % (ref 39.0–52.0)
Hemoglobin: 12.6 g/dL — ABNORMAL LOW (ref 13.0–17.0)
Immature Granulocytes: 0 %
Lymphocytes Relative: 16 %
Lymphs Abs: 1.1 K/uL (ref 0.7–4.0)
MCH: 30.4 pg (ref 26.0–34.0)
MCHC: 30.7 g/dL (ref 30.0–36.0)
MCV: 99 fL (ref 80.0–100.0)
Monocytes Absolute: 0.7 K/uL (ref 0.1–1.0)
Monocytes Relative: 10 %
Neutro Abs: 5.1 K/uL (ref 1.7–7.7)
Neutrophils Relative %: 72 %
Platelets: 210 K/uL (ref 150–400)
RBC: 4.14 MIL/uL — ABNORMAL LOW (ref 4.22–5.81)
RDW: 13.1 % (ref 11.5–15.5)
WBC: 7.1 K/uL (ref 4.0–10.5)
nRBC: 0 % (ref 0.0–0.2)

## 2024-05-14 LAB — RESP PANEL BY RT-PCR (RSV, FLU A&B, COVID)  RVPGX2
Influenza A by PCR: NEGATIVE
Influenza B by PCR: NEGATIVE
Resp Syncytial Virus by PCR: NEGATIVE
SARS Coronavirus 2 by RT PCR: NEGATIVE

## 2024-05-14 LAB — TROPONIN I (HIGH SENSITIVITY)
Troponin I (High Sensitivity): 46 ng/L — ABNORMAL HIGH (ref <18)
Troponin I (High Sensitivity): 44 ng/L — ABNORMAL HIGH (ref <18)

## 2024-05-14 LAB — HIV ANTIBODY (ROUTINE TESTING W REFLEX): HIV Screen 4th Generation wRfx: NONREACTIVE

## 2024-05-14 LAB — BRAIN NATRIURETIC PEPTIDE: B Natriuretic Peptide: 950 pg/mL — ABNORMAL HIGH (ref 0.0–100.0)

## 2024-05-14 LAB — MAGNESIUM: Magnesium: 1.8 mg/dL (ref 1.7–2.4)

## 2024-05-14 LAB — PROTIME-INR
INR: 1 (ref 0.8–1.2)
Prothrombin Time: 14.2 s (ref 11.4–15.2)

## 2024-05-14 MED ORDER — FUROSEMIDE 10 MG/ML IJ SOLN
40.0000 mg | Freq: Two times a day (BID) | INTRAMUSCULAR | Status: DC
  Administered 2024-05-15: 40 mg via INTRAVENOUS
  Filled 2024-05-14: qty 4

## 2024-05-14 MED ORDER — ACETAMINOPHEN 650 MG RE SUPP: 650.0000 mg | Freq: Four times a day (QID) | RECTAL | Status: DC | PRN

## 2024-05-14 MED ORDER — SENNOSIDES-DOCUSATE SODIUM 8.6-50 MG PO TABS: 1.0000 | ORAL_TABLET | Freq: Every evening | ORAL | Status: DC | PRN

## 2024-05-14 MED ORDER — UMECLIDINIUM-VILANTEROL 62.5-25 MCG/ACT IN AEPB: 1.0000 | INHALATION_SPRAY | Freq: Every day | RESPIRATORY_TRACT | 4 refills | Status: DC

## 2024-05-14 MED ORDER — IPRATROPIUM-ALBUTEROL 0.5-2.5 (3) MG/3ML IN SOLN
3.0000 mL | Freq: Once | RESPIRATORY_TRACT | Status: AC
  Administered 2024-05-14: 3 mL via RESPIRATORY_TRACT
  Filled 2024-05-14: qty 3

## 2024-05-14 MED ORDER — PREDNISONE 20 MG PO TABS
40.0000 mg | ORAL_TABLET | Freq: Every day | ORAL | Status: AC
  Administered 2024-05-15 – 2024-05-18 (×4): 40 mg via ORAL
  Filled 2024-05-14 (×4): qty 2

## 2024-05-14 MED ORDER — SODIUM CHLORIDE 0.9 % IV SOLN
500.0000 mg | INTRAVENOUS | Status: DC
  Administered 2024-05-15: 500 mg via INTRAVENOUS
  Filled 2024-05-14 (×2): qty 5

## 2024-05-14 MED ORDER — ENOXAPARIN SODIUM 40 MG/0.4ML IJ SOSY
40.0000 mg | PREFILLED_SYRINGE | INTRAMUSCULAR | Status: DC
  Administered 2024-05-15 – 2024-05-17 (×4): 40 mg via SUBCUTANEOUS
  Filled 2024-05-14 (×4): qty 0.4

## 2024-05-14 MED ORDER — ACETAMINOPHEN 325 MG PO TABS: 650.0000 mg | ORAL_TABLET | Freq: Four times a day (QID) | ORAL | Status: DC | PRN

## 2024-05-14 MED ORDER — NICOTINE 14 MG/24HR TD PT24
14.0000 mg | MEDICATED_PATCH | Freq: Every day | TRANSDERMAL | Status: DC
  Administered 2024-05-14 – 2024-05-18 (×5): 14 mg via TRANSDERMAL
  Filled 2024-05-14 (×5): qty 1

## 2024-05-14 MED ORDER — FUROSEMIDE 10 MG/ML IJ SOLN
40.0000 mg | Freq: Once | INTRAMUSCULAR | Status: AC
  Administered 2024-05-14: 40 mg via INTRAVENOUS
  Filled 2024-05-14: qty 4

## 2024-05-14 MED ORDER — ALBUTEROL SULFATE HFA 108 (90 BASE) MCG/ACT IN AERS: 2.0000 | INHALATION_SPRAY | Freq: Four times a day (QID) | RESPIRATORY_TRACT | 2 refills | Status: AC | PRN

## 2024-05-14 MED ORDER — MAGNESIUM SULFATE 2 GM/50ML IV SOLN
2.0000 g | Freq: Once | INTRAVENOUS | Status: AC
  Administered 2024-05-14: 2 g via INTRAVENOUS
  Filled 2024-05-14: qty 50

## 2024-05-14 MED ORDER — BUDESONIDE 0.5 MG/2ML IN SUSP
0.5000 mg | Freq: Two times a day (BID) | RESPIRATORY_TRACT | Status: DC
  Administered 2024-05-14 – 2024-05-15 (×2): 0.5 mg via RESPIRATORY_TRACT
  Filled 2024-05-14 (×2): qty 2

## 2024-05-14 MED ORDER — METHYLPREDNISOLONE SODIUM SUCC 125 MG IJ SOLR
125.0000 mg | Freq: Once | INTRAMUSCULAR | Status: AC
  Administered 2024-05-14: 125 mg via INTRAVENOUS
  Filled 2024-05-14: qty 2

## 2024-05-14 MED ORDER — IPRATROPIUM-ALBUTEROL 0.5-2.5 (3) MG/3ML IN SOLN
3.0000 mL | Freq: Four times a day (QID) | RESPIRATORY_TRACT | Status: DC
  Administered 2024-05-14 – 2024-05-15 (×2): 3 mL via RESPIRATORY_TRACT
  Filled 2024-05-14 (×2): qty 3

## 2024-05-14 MED ORDER — ALBUTEROL SULFATE (2.5 MG/3ML) 0.083% IN NEBU
2.5000 mg | INHALATION_SOLUTION | Freq: Once | RESPIRATORY_TRACT | Status: AC
  Administered 2024-05-14: 2.5 mg via RESPIRATORY_TRACT
  Filled 2024-05-14: qty 3

## 2024-05-14 MED ORDER — SODIUM CHLORIDE 0.9% FLUSH
3.0000 mL | Freq: Two times a day (BID) | INTRAVENOUS | Status: DC
  Administered 2024-05-15 – 2024-05-17 (×5): 3 mL via INTRAVENOUS

## 2024-05-14 NOTE — ED Notes (Signed)
 Pt educated about risk of getting out of bed alone as he is a high fall risk. Urinal provided, bed locked and in lower position, nonskid socks on, bed alarm on, fall risk bracelet on pt and fall risk magnet on the door.

## 2024-05-14 NOTE — Progress Notes (Signed)
 Patient arrieved at 2200 from the ED. Patient continues to ask for food, called his sister to bring patient food. Reeducated patient and girlfriend patient is NPO. Nothing by mouth. Continues to ask staff. Sign posted, all staff aware patient is NPO

## 2024-05-14 NOTE — ED Notes (Signed)
 Spoke to receiving RN on 2A, states she is ready for pt. Transp[ort requested

## 2024-05-14 NOTE — Progress Notes (Addendum)
 BP (!) 149/72   Pulse 70   Temp 98.3 F (36.8 C) (Oral)   Ht 5' 4 (1.626 m)   Wt 168 lb 6 oz (76.4 kg)   SpO2 93%   BMI 28.90 kg/m    Subjective:    Patient ID: Blake [name], male    DOB: 1956-02-26, 67 y.o.   MRN: [id number]  HPI: Blake [name] is a 68 y.o. male  Chief Complaint  Patient presents with   Shortness of Breath    A little better, still have the cough and congestion   HYPERTENSION without Chronic Kidney Disease and HF Taking Lisinopril , Lasix , Metoprolol , ASA.  Continues to have shortness of breath which he reports has not improved. Abdomen feels bloated.  Last echo on 06/08/2019 noted EF 35 to 40%, have ordered repeat in past but did not attend. Had left heart catheterization on 06/09/2019. Has not seen cardiology since 11/27/22 and HF Clinic 05/23/21. Hypertension status: uncontrolled  Satisfied with current treatment? yes Duration of hypertension: chronic BP monitoring frequency:  not checking BP range:  BP medication side effects:  no Medication compliance: poor compliance Previous BP meds: as above Aspirin : yes Recurrent headaches: no Visual changes: no Palpitations: yes Dyspnea: yes Chest pain: yes when coughing it hurts Lower extremity edema: yes Dizzy/lightheaded: no   COPD Treated with Doxycycline and Prednisone  on 05/07/24 + increased Lasix  to 80 MG for period due to SOB and HF. Continues to report SOB with no improvement. Has been out of inhalers for 2-3 weeks. COPD status: exacerbated Satisfied with current treatment?: yes Oxygen use: no Dyspnea frequency: yes Cough frequency: yes Rescue inhaler frequency:  occasional Limitation of activity: yes Productive cough: nothing Last Spirometry: unknown Pneumovax: Not up to Date Influenza: Not up to Date   Relevant past medical, surgical, family and social history reviewed and updated as indicated. Interim medical history since our last visit reviewed. Allergies and medications  reviewed and updated.  Review of Systems  Constitutional:  Positive for appetite change and fatigue. Negative for activity change, chills, diaphoresis and fever.  HENT:  Positive for postnasal drip and rhinorrhea.   Respiratory:  Positive for cough, chest tightness, shortness of breath and wheezing.   Cardiovascular:  Positive for chest pain, palpitations and leg swelling.  Gastrointestinal:  Positive for abdominal distention. Negative for abdominal pain, constipation, diarrhea, nausea and vomiting.  Neurological: Negative.   Psychiatric/Behavioral: Negative.      Per HPI unless specifically indicated above     Objective:    BP (!) 149/72   Pulse 70   Temp 98.3 F (36.8 C) (Oral)   Ht 5' 4 (1.626 m)   Wt 168 lb 6 oz (76.4 kg)   SpO2 93%   BMI 28.90 kg/m   Wt Readings from Last 3 Encounters:  05/14/24 174 lb (78.9 kg)  05/14/24 168 lb 6 oz (76.4 kg)  05/07/24 177 lb (80.3 kg)    Physical Exam Vitals and nursing note reviewed.  Constitutional:      General: He is awake. He is not in acute distress.    Appearance: He is well-developed and well-groomed. He is ill-appearing. He is not toxic-appearing.  HENT:     Head: Normocephalic.     Right Ear: Hearing, ear canal and external ear normal. A middle ear effusion is present. There is no impacted cerumen.     Left Ear: Hearing, ear canal and external ear normal. A middle ear effusion is present. There is no impacted cerumen.  Nose: Rhinorrhea present. Rhinorrhea is clear.  Eyes:     General: Lids are normal.     Extraocular Movements: Extraocular movements intact.     Conjunctiva/sclera: Conjunctivae normal.  Neck:     Thyroid : No thyromegaly.     Vascular: No carotid bruit.  Cardiovascular:     Rate and Rhythm: Normal rate and regular rhythm.     Heart sounds: Normal heart sounds. No murmur heard.    No gallop.     Comments: Dry skin to both legs. Pulmonary:     Effort: No accessory muscle usage or respiratory  distress.     Breath sounds: Decreased air movement present. Wheezing and rales present.     Comments: Coarse breath sounds throughout.  SOB with talking. Cannot move without SOB presenting. Abdominal:     General: Bowel sounds are normal. There is distension (mild noted).     Palpations: Abdomen is soft.     Tenderness: There is no abdominal tenderness.  Musculoskeletal:     Cervical back: Full passive range of motion without pain.     Right lower leg: 2+ Pitting Edema present.     Left lower leg: 2+ Pitting Edema present.  Lymphadenopathy:     Cervical: No cervical adenopathy.  Skin:    General: Skin is warm.     Capillary Refill: Capillary refill takes less than 2 seconds.  Neurological:     Mental Status: He is alert and oriented to person, place, and time.     Deep Tendon Reflexes: Reflexes are normal and symmetric.     Reflex Scores:      Brachioradialis reflexes are 2+ on the right side and 2+ on the left side.      Patellar reflexes are 2+ on the right side and 2+ on the left side. Psychiatric:        Attention and Perception: Attention normal.        Mood and Affect: Mood normal.        Speech: Speech normal.        Behavior: Behavior normal. Behavior is cooperative.        Thought Content: Thought content normal.     Results for orders placed or performed in visit on 05/07/24  CBC w/Diff   Collection Time: 05/07/24 10:46 AM  Result Value Ref Range   WBC 4.3 3.4 - 10.8 x10E3/uL   RBC 4.22 4.14 - 5.80 x10E6/uL   Hemoglobin 12.8 (L) 13.0 - 17.7 g/dL   Hematocrit 59.1 62.4 - 51.0 %   MCV 97 79 - 97 fL   MCH 30.3 26.6 - 33.0 pg   MCHC 31.4 (L) 31.5 - 35.7 g/dL   RDW 88.0 88.3 - 84.5 %   Platelets 167 150 - 450 x10E3/uL   Neutrophils 69 Not Estab. %   Lymphs 18 Not Estab. %   Monocytes 11 Not Estab. %   Eos 1 Not Estab. %   Basos 1 Not Estab. %   Neutrophils Absolute 3.0 1.4 - 7.0 x10E3/uL   Lymphocytes Absolute 0.8 0.7 - 3.1 x10E3/uL   Monocytes Absolute 0.5  0.1 - 0.9 x10E3/uL   EOS (ABSOLUTE) 0.1 0.0 - 0.4 x10E3/uL   Basophils Absolute 0.0 0.0 - 0.2 x10E3/uL   Immature Granulocytes 0 Not Estab. %   Immature Grans (Abs) 0.0 0.0 - 0.1 x10E3/uL  B Nat Peptide   Collection Time: 05/07/24 10:46 AM  Result Value Ref Range   BNP 2,037.7 (H) 0.0 - 100.0 pg/mL  Basic Metabolic Panel (BMET)   Collection Time: 05/07/24 10:46 AM  Result Value Ref Range   Glucose 113 (H) 70 - 99 mg/dL   BUN 20 8 - 27 mg/dL   Creatinine, Ser 8.92 0.76 - 1.27 mg/dL   eGFR 75 >40 fO/fpw/8.26   BUN/Creatinine Ratio 19 10 - 24   Sodium 142 134 - 144 mmol/L   Potassium 4.4 3.5 - 5.2 mmol/L   Chloride 100 96 - 106 mmol/L   CO2 28 20 - 29 mmol/L   Calcium  9.7 8.6 - 10.2 mg/dL  Magnesium   Collection Time: 05/07/24 10:46 AM  Result Value Ref Range   Magnesium 1.6 1.6 - 2.3 mg/dL      Assessment & Plan:   Problem List Items Addressed This Visit       Cardiovascular and Mediastinum   Acute on chronic systolic (congestive) heart failure (HCC) - Primary   Acute exacerbation, was treated last week with increase in Lasix  but continues to have SOB with talking and signficant edema, BNP last week in 2000 range.  At this time symptoms continue with no improvement. Having SOB with talking and struggling with breathing in different positions. Have called EMS and will have him assessed at hospital. Suspect will need admission for possible COPD exacerbation and acute HF episode.  He is not consistent with taking medications and attending visits. Will place [facility] referral.      Relevant Orders   AMB Referral [facility] Care Management     Respiratory   Centrilobular emphysema (HCC)   Chronic, ongoing in daily smoker.  Recommend complete cessation of smoking.  Not consistent with using inhalers. Will place new referral to PharmD to assist on medication regimen. Discussed importance of using Anoro daily as is preventative to help with symptoms and to use Albuterol  only as needed,  educated sister and him on this.  Would benefit lung CT cancer screening, but refuses at this time.    - Concern currently due to no improvement of symptoms from last week and having SOB with talking. Will send him to ER for further assessment and work-up.       Relevant Medications   umeclidinium-vilanterol (ANORO ELLIPTA ) 62.5-25 MCG/ACT AEPB   albuterol  (VENTOLIN  HFA) 108 (90 Base) MCG/ACT inhaler   Other Relevant Orders   AMB Referral [facility] Care Management    I personally spent a total of 30 minutes in the care of the patient today including preparing to see the patient, getting/reviewing separately obtained history, performing a medically appropriate exam/evaluation, counseling and educating, referring and communicating with other health care professionals, and coordinating care.   Follow up plan: Return for Return after ER visit for follow-up.

## 2024-05-14 NOTE — ED Notes (Signed)
 Pt's family called out due to pt's bed being wet, upon entering the room and checking it seems as though the pt's urinal spilled in the bed. This tech changed pt's bed and placed a chuck underneath pt. Pt was aware that his boxers were wet but stated that he did not want to change his boxers. Pt has no other needs at the moment.

## 2024-05-14 NOTE — ED Notes (Signed)
Took pt off bipap 

## 2024-05-14 NOTE — H&P (Signed)
 History and Physical    Blake [name]:[id number] DOB: Jul 17, 1955 DOA: 05/14/2024  DOS: the patient was seen and examined on 05/14/2024  PCP: Blake Melanie DASEN, NP   Patient coming from: Clinic  I have personally briefly reviewed patient's old medical records in [facility] and CareEverywhere  HPI:   Blake [name] is a 68 y.o. year old male with past medical history of hypertension, hyperlipidemia, CAD status post stent placement, HFrEF (EF 35% in 05/2021), COPD presenting to the ED with worsening dyspnea.   Pt states over the last 3 to 4 days he has been developing dyspnea with exertion.  It has been worsening to where patient is not able to ambulate across the room without getting short of breath.  Prior to this he was able to ambulate without getting any shortness of breath.  He denies any fever or chills.  But did state he has a cough without any sputum production.  He does not report any sick contacts.   On arrival to the ED patient was noted to be HDS stable.  Lab work and imaging obtained.  CBC without leukocytosis, mild anemia.  CMP with elevated bicarb, but no AKI.  VBG with normal pH but elevated pCO2 at 73.  Respiratory panel negative for COVID flu and RSV.  BNP elevated at 950.  Troponin mildly elevated at 46, repeat pending.  Chest x-ray showing some signs of volume overload.  EDP gave patient Solu-Medrol , DuoNeb and placed him on BiPAP due to concern for tachypnea.  Given need for continued care [facility] was consulted   Review of Systems: As mentioned in the history of present illness. All other systems reviewed and are negative.   Past Medical History:  Diagnosis Date   Acute headache    CHF (congestive heart failure) (HCC)    COPD (chronic obstructive pulmonary disease) (HCC)    Coronary artery disease    a. cath 2009 w/ severe 3-vessel dz, rec bypass; b. MI 2012-->4-vessel cabg; c. cath 2016: occluded VG-OM and VG-RPDA, patent LIMA-LAD and patent VG-Diag,  95% stenosis of mid LCx s/p PCI/DES 0% residual stenosis   Depression    Hepatitis B    Hepatitis C    HFrEF (heart failure with reduced ejection fraction) (HCC)    a. LV gram 2009 with EF 32% with anterolateral and apical HK   Hyperlipidemia    Hypertension    MI (myocardial infarction) (HCC)    x 2   Tobacco abuse     Past Surgical History:  Procedure Laterality Date   CARDIAC CATHETERIZATION     CORONARY ANGIOPLASTY WITH STENT PLACEMENT  2009   CORONARY ARTERY BYPASS GRAFT  2012   [facility]   KNEE SURGERY     right knee    LEFT HEART CATH AND CORS/GRAFTS ANGIOGRAPHY N/A 06/09/2019   Procedure: LEFT HEART CATH AND CORS/GRAFTS ANGIOGRAPHY;  Surgeon: Darron Deatrice LABOR, MD;  Location: [facility] INVASIVE CV LAB;  Service: Cardiovascular;  Laterality: N/A;     No Known Allergies  Family History  Problem Relation Age of Onset   Hypertension Mother    Hypertension Father    Hypertension Sister    Hypertension Sister     Prior to Admission medications   Medication Sig Start Date End Date Taking? Authorizing Provider  albuterol  (VENTOLIN  HFA) 108 (90 Base) MCG/ACT inhaler Inhale 2 puffs into the lungs every 6 (six) hours as needed. 05/14/24   Cannady, Jolene T, NP  aspirin  81 MG chewable tablet Chew  1 tablet (81 mg total) by mouth daily. 09/04/23   Cannady, Jolene T, NP  atorvastatin  (LIPITOR) 40 MG tablet Take 1 tablet (40 mg total) by mouth daily. 12/19/23   Cannady, Jolene T, NP  Cholecalciferol  (VITAMIN D3) 1.25 MG (50000 UT) CAPS Take 1 capsule (50,000 Units total) by mouth every 7 (seven) days. 09/04/23   Cannady, Jolene T, NP  Cyanocobalamin  (B-12) 1000 MCG TABS Take 1 tablet (1,000 mcg total) by mouth daily. 09/04/23   Cannady, Jolene T, NP  furosemide  (LASIX ) 40 MG tablet Take 1 tablet (40 mg total) by mouth daily. 12/19/23   Cannady, Jolene T, NP  furosemide  (LASIX ) 80 MG tablet Take 1 tablet (80 mg total) by mouth daily for 7 days. 05/07/24 05/14/24  Herold Hadassah SQUIBB, MD  lisinopril   (ZESTRIL ) 5 MG tablet Take 1 tablet (5 mg total) by mouth daily. 12/19/23   Cannady, Jolene T, NP  metoprolol  succinate (TOPROL -XL) 25 MG 24 hr tablet Take 1 tablet (25 mg total) by mouth daily. 12/19/23   Cannady, Jolene T, NP  nitroGLYCERIN  (NITROSTAT ) 0.4 MG SL tablet Place 1 tablet (0.4 mg total) under the tongue every 5 (five) minutes as needed for chest pain. Reported on 09/29/2015 09/04/23   Cannady, Jolene T, NP  omeprazole  (PRILOSEC) 20 MG capsule Take 1 capsule (20 mg total) by mouth daily. 09/04/23   Cannady, Jolene T, NP  rivastigmine  (EXELON ) 3 MG capsule Take 1 capsule (3 mg total) by mouth 2 (two) times daily. 12/19/23   Cannady, Jolene T, NP  traZODone  (DESYREL ) 50 MG tablet Take 0.5-1 tablets (25-50 mg total) by mouth at bedtime as needed for sleep. 12/19/23   Cannady, Jolene T, NP  umeclidinium-vilanterol (ANORO ELLIPTA ) 62.5-25 MCG/ACT AEPB Inhale 1 puff into the lungs daily at 6 (six) AM. 05/14/24   Blake Melanie DASEN, NP    Social History:  reports that he has been smoking cigarettes. He has a 63 pack-year smoking history. He has been exposed to tobacco smoke. He has never used smokeless tobacco. He reports that he does not currently use drugs after having used the following drugs: Heroin. He reports that he does not drink alcohol. Lives with wife Tobacco-currently smoking but has decreased smoking to 1 pack a day EtOH- Denies use.  Illicit drug use- denies use.  IADLs/ADLs- can perform independently at baseline    Physical Exam: Vitals:   05/14/24 1529 05/14/24 1540 05/14/24 1642 05/14/24 1905  BP:    (!) 160/89  Pulse:   78 75  Resp:  (!) 24 (!) 21 (!) 23  Temp:      TempSrc:      SpO2:   100% 99%  Weight: 78.9 kg     Height: 5' 6 (1.676 m)        Gen: NAD HENT: BiPAP in place CV: Regular rate and rhythm, good pulses in all extremities Resp: Slight wheeze heard, patient with diminished breath sounds at bilateral bases. Abd: No TTP, no bowel sounds, right inguinal  hernia noted that is reducible. MSK: No asymmetry Skin: No lesions noted on skin Neuro: Alert and oriented x 4 Psych: Pleasant mood   Labs on Admission: I have personally reviewed following labs and imaging studies  CBC: Recent Labs  Lab 05/14/24 1541  WBC 7.1  NEUTROABS 5.1  HGB 12.6*  HCT 41.0  MCV 99.0  PLT 210   Basic Metabolic Panel: Recent Labs  Lab 05/14/24 1541 05/14/24 1743  NA 140  --   K  3.7  --   CL 97*  --   CO2 34*  --   GLUCOSE 106*  --   BUN 25*  --   CREATININE 1.09  --   CALCIUM  8.9  --   MG  --  1.8   GFR: Estimated Creatinine Clearance: 62.3 mL/min (by C-G formula based on SCr of 1.09 mg/dL). Liver Function Tests: Recent Labs  Lab 05/14/24 1541  AST 33  ALT 18  ALKPHOS 93  BILITOT 0.6  PROT 8.0  ALBUMIN 3.0*   No results for input(s): LIPASE, AMYLASE in the last 168 hours. No results for input(s): AMMONIA in the last 168 hours. Coagulation Profile: Recent Labs  Lab 05/14/24 1541  INR 1.0   Cardiac Enzymes: Recent Labs  Lab 05/14/24 1541 05/14/24 1743  TROPONINIHS 46* 44*   BNP (last 3 results) Recent Labs    12/19/23 1439 05/07/24 1046 05/14/24 1541  BNP 521.4* 2,037.7* 950.0*   HbA1C: No results for input(s): HGBA1C in the last 72 hours. CBG: No results for input(s): GLUCAP in the last 168 hours. Lipid Profile: No results for input(s): CHOL, HDL, LDLCALC, TRIG, CHOLHDL, LDLDIRECT in the last 72 hours. Thyroid  Function Tests: No results for input(s): TSH, T4TOTAL, FREET4, T3FREE, THYROIDAB in the last 72 hours. Anemia Panel: No results for input(s): VITAMINB12, FOLATE, FERRITIN, TIBC, IRON, RETICCTPCT in the last 72 hours. Urine analysis:    Component Value Date/Time   COLORURINE YELLOW (A) 09/05/2021 1634   APPEARANCEUR CLEAR (A) 09/05/2021 1634   LABSPEC 1.019 09/05/2021 1634   PHURINE 6.0 09/05/2021 1634   GLUCOSEU NEGATIVE 09/05/2021 1634   HGBUR NEGATIVE  09/05/2021 1634   BILIRUBINUR NEGATIVE 09/05/2021 1634   KETONESUR NEGATIVE 09/05/2021 1634   PROTEINUR NEGATIVE 09/05/2021 1634   NITRITE NEGATIVE 09/05/2021 1634   LEUKOCYTESUR TRACE (A) 09/05/2021 1634    Radiological Exams on Admission: I have personally reviewed images DG Chest Portable 1 View Result Date: 05/14/2024 EXAM: 1 VIEW(S) XRAY OF THE CHEST 05/14/2024 03:54:41 PM COMPARISON: 02/08/2024 CLINICAL HISTORY: short of breath FINDINGS: LUNGS AND PLEURA: Mild diffuse interstitial prominence. No focal pulmonary opacity. No pulmonary edema. No pleural effusion. No pneumothorax. HEART AND MEDIASTINUM: Mild cardiomegaly. Aortic atherosclerosis. Post-CABG changes noted. Mild central vascular prominence. BONES AND SOFT TISSUES: Intact sternotomy wires. No acute osseous abnormality. IMPRESSION: 1. Mild diffuse interstitial prominence, which may reflect early pulmonary edema due to CHF treatment . Electronically signed by: Waddell Calk MD 05/14/2024 04:13 PM EST RP Workstation: [id number]    EKG: My personal interpretation of EKG shows: Sinus rhythm without any acute ST changes does show some premature beats.  There is baseline artifact    Assessment/Plan Principal Problem:   Respiratory failure with hypercapnia (HCC) Active Problems:   S/P CABG (coronary artery bypass graft)   Chronic systolic CHF (congestive heart failure) (HCC)   Benign prostatic hyperplasia with urinary obstruction   Acute on chronic systolic (congestive) heart failure (HCC)   Patient with respiratory failure likely secondary to COPD exacerbation and CHF exacerbation.  VBG shows hypercapnia.  Patient started on BiPAP and nebulizers.  He was given 1 dose of Solu-Medrol  and will continue him on prednisone  and azithromycin for COPD exacerbation.  He was given 1 dose of IV Lasix  and at bedside was noted to have good urine output.  Will continue IV Lasix  as his chest x-ray shows volume overload and his BNP is elevated.  I do  not appreciate rales on exam but he has poor air movement in  the bases.  Will monitor his input and output.  Replete his electrolytes and monitor him on telemetry.  Will trial him off BiPAP and repeat VBG in 30 minutes.   Hyperlipidemia: Resume his statin once he is taking  p.o.  Hypertension: Holding home medications while we are diuresing him.  Can restart his beta-blocker once he is off BiPAP.  GERD: Continue home PPI once he is off BiPAP.   VTE prophylaxis:  Lovenox  Diet: Heart healthy Code Status:  Full Code Telemetry:  Admission status: Inpatient, Telemetry bed Patient is from: Home Anticipated d/c is to: Home Anticipated d/c is in: 3-4 days   Family Communication: Updated at bedside  Consults called: None   Severity of Illness: The appropriate patient status for this patient is INPATIENT. Inpatient status is judged to be reasonable and necessary in order to provide the required intensity of service to ensure the patient's safety. The patient's presenting symptoms, physical exam findings, and initial radiographic and laboratory data in the context of their chronic comorbidities is felt to place them at high risk for further clinical deterioration. Furthermore, it is not anticipated that the patient will be medically stable for discharge from the hospital within 2 midnights of admission.   * I certify that at the point of admission it is my clinical judgment that the patient will require inpatient hospital care spanning beyond 2 midnights from the point of admission due to high intensity of service, high risk for further deterioration and high frequency of surveillance required.DEWAINE Morene Bathe, MD [facility]ol[facility] DEL. [facility]

## 2024-05-14 NOTE — Assessment & Plan Note (Signed)
 Chronic, ongoing in daily smoker.  Recommend complete cessation of smoking.  Not consistent with using inhalers. Will place new referral to PharmD to assist on medication regimen. Discussed importance of using Anoro daily as is preventative to help with symptoms and to use Albuterol  only as needed, educated sister and him on this.  Would benefit lung CT cancer screening, but refuses at this time.    - Concern currently due to no improvement of symptoms from last week and having SOB with talking. Will send him to ER for further assessment and work-up.

## 2024-05-14 NOTE — ED Triage Notes (Addendum)
 Pt via [facility] from [facility] due to primary provider at the facility believe he was having a CHF exacerbation. Pt has bilateral wheezing. Pt has bilateral edema in lower extremities

## 2024-05-14 NOTE — Assessment & Plan Note (Addendum)
 Acute exacerbation, was treated last week with increase in Lasix  but continues to have SOB with talking and signficant edema, BNP last week in 2000 range.  At this time symptoms continue with no improvement. Having SOB with talking and struggling with breathing in different positions. Have called EMS and will have him assessed at hospital. Suspect will need admission for possible COPD exacerbation and acute HF episode.  He is not consistent with taking medications and attending visits. Will place [facility] referral.

## 2024-05-14 NOTE — ED Provider Notes (Signed)
 [facility] Provider Note    Event Date/Time   First MD Initiated Contact with Patient 05/14/24 1527     (approximate)   History   Shortness of Breath  Pt via [facility] from [facility] due to primary provider at the facility believe he was having a CHF exacerbation. Pt has bilateral wheezing. Pt has bilateral edema in lower extremities    HPI Blake [name] is a 68 y.o. male PMH COPD/emphysema, CHF, hypertension, hyperlipidemia, CAD with prior CABG presents by EMS from clinic due to concern for CHF exacerbation -Per EMS, patient was diffusely wheezy, received 2.5 mg albuterol . -On my evaluation, patient states he has been feeling short of breath for about a week.  Denies fever cough.  Does note some bilateral lower extremity edema. - +smoker - no chest pain     Physical Exam   Triage Vital Signs: BP (!) 157/91 (BP Location: Right Arm)   Pulse 76   Temp 98.4 F (36.9 C) (Oral)   Resp (!) 24   Ht 5' 6 (1.676 m)   Wt 78.9 kg   SpO2 97%   BMI 28.08 kg/m     Most recent vital signs: Vitals:   05/14/24 1527 05/14/24 1540  BP: (!) 157/91   Pulse: 76   Resp: 18 (!) 24  Temp: 98.4 F (36.9 C)   SpO2: 97%      General: Awake, + respiratory distress CV:  Good peripheral perfusion. RRR, RP 2+, + moderate pitting edema to bilateral lower extremities Resp:  + Tachypneic, 5 word sentences, end expiratory wheezing and coarse breath sounds throughout. Abd:  No distention. Nontender to deep palpation throughout    ED Results / Procedures / Treatments   Labs (all labs ordered are listed, but only abnormal results are displayed) Labs Reviewed  COMPREHENSIVE METABOLIC PANEL WITH GFR - Abnormal; Notable for the following components:      Result Value   Chloride 97 (*)    CO2 34 (*)    Glucose, Bld 106 (*)    BUN 25 (*)    Albumin 3.0 (*)    All other components within normal limits  CBC WITH DIFFERENTIAL/PLATELET - Abnormal;  Notable for the following components:   RBC 4.14 (*)    Hemoglobin 12.6 (*)    All other components within normal limits  BRAIN NATRIURETIC PEPTIDE - Abnormal; Notable for the following components:   B Natriuretic Peptide 950.0 (*)    All other components within normal limits  BLOOD GAS, VENOUS - Abnormal; Notable for the following components:   pCO2, Ven 73 (*)    Bicarbonate 45.2 (*)    Acid-Base Excess 16.5 (*)    All other components within normal limits  TROPONIN I (HIGH SENSITIVITY) - Abnormal; Notable for the following components:   Troponin I (High Sensitivity) 46 (*)    All other components within normal limits  RESP PANEL BY RT-PCR (RSV, FLU A&B, COVID)  RVPGX2  PROTIME-INR     EKG  Ecg = sinus rhythm, rate 73, no gross ST elevation or depression, some inverted T waves throughout, normal axis, first-degree AV block but otherwise normal intervals.  No clear evidence of ischemia on initial read  --appears similar to prior EKG from 8//25.   RADIOLOGY Radiology interpreted by myself radiology report reviewed.  Notable for evidence of pulmonary edema.    PROCEDURES:  Critical Care performed: Yes, see critical care procedure note(s)  .Critical Care  Performed by: Clarine Sharper  A, MD Authorized by: Clarine Ozell LABOR, MD   Critical care provider statement:    Critical care time (minutes):  30   Critical care time was exclusive of:  Separately billable procedures and treating other patients   Critical care was necessary to treat or prevent imminent or life-threatening deterioration of the following conditions:  Respiratory failure   Critical care was time spent personally by me on the following activities:  Development of treatment plan with patient or surrogate, discussions with consultants, evaluation of patient's response to treatment, examination of patient, ordering and review of laboratory studies, ordering and review of radiographic studies, ordering and performing  treatments and interventions, pulse oximetry, re-evaluation of patient's condition and review of old charts   I assumed direction of critical care for this patient from another provider in my specialty: no      MEDICATIONS ORDERED IN ED: Medications  furosemide  (LASIX ) injection 40 mg (has no administration in time range)  albuterol  (PROVENTIL ) (2.5 MG/3ML) 0.083% nebulizer solution 2.5 mg (has no administration in time range)  ipratropium-albuterol  (DUONEB) 0.5-2.5 (3) MG/3ML nebulizer solution 3 mL (3 mLs Nebulization Given 05/14/24 1548)  ipratropium-albuterol  (DUONEB) 0.5-2.5 (3) MG/3ML nebulizer solution 3 mL (3 mLs Nebulization Given 05/14/24 1548)  methylPREDNISolone  sodium succinate (SOLU-MEDROL ) 125 mg/2 mL injection 125 mg (125 mg Intravenous Given 05/14/24 1541)     IMPRESSION / MDM / ASSESSMENT AND PLAN / ED COURSE  I reviewed the triage vital signs and the nursing notes.                              DDX/MDM/AP: Differential diagnosis includes, but is not limited to, COPD exacerbation, CHF exacerbation, consider underlying pneumonia, consider viral syndrome including influenza or COVID-19, ACS, doubt pneumothorax or PE.  Plan: - Labs - Chest x-ray - DuoNeb x 2, methylprednisolone  - Consider escalation to BiPAP if not proving - EKG - Reassess  Patient's presentation is most consistent with acute presentation with potential threat to life or bodily function.  The patient is on the cardiac monitor to evaluate for evidence of arrhythmia and/or significant heart rate changes.  ED course below.  Workup overall consistent with CHF exacerbation and COPD exacerbation.  Evidence of pulmonary edema as well as hypercarbia.  Somewhat improving with DuoNebs and IV steroids so continued significant increased work of breathing, initiating BiPAP.  Diuresing.  Admitted to hospitalist service.  Clinical Course as of 05/14/24 1622  Wed May 14, 2024  1552 CBC with no leukocytosis, stable  anemia [MM]  1556 Chest x-ray with no obvious focal consolidation on my interpretation, formal radiology read pending [MM]  1615 VBG with hypercarbia though elevated bicarb as well, fortunately no acidosis, suspect some component of chronic retention [MM]  1615 Mildly elevated troponin similar to prior [MM]  1616 Formal CXR read: IMPRESSION: 1. Mild diffuse interstitial prominence, which may reflect early pulmonary edema due to CHF treatment .   [MM]  1616 CMP with elevated bicarb, overall unremarkable [MM]  1616 BNP greater than 2 times above baseline, consistent with some element of CHF, will initiate diuresis [MM]  1617 Reevaluated, still notably tachypneic to the upper 20s to low 30s though send fortunately satting well on room air  Will initiate BiPAP for ongoing increased work of breathing despite serial nebs and IV steroids  Also diuresing  Hospitalist consult order placed [MM]    Clinical Course User Index [MM] Clarine Ozell LABOR, MD  FINAL CLINICAL IMPRESSION(S) / ED DIAGNOSES   Final diagnoses:  Acute on chronic respiratory failure with hypercapnia (HCC)  COPD exacerbation (HCC)  Acute on chronic congestive heart failure, unspecified heart failure type (HCC)     Rx / DC Orders   ED Discharge Orders     None        Note:  This document was prepared using Dragon voice recognition software and may include unintentional dictation errors.   Clarine Ozell LABOR, MD 05/14/24 [phone]

## 2024-05-15 ENCOUNTER — Other Ambulatory Visit (HOSPITAL_COMMUNITY): Payer: Self-pay

## 2024-05-15 ENCOUNTER — Telehealth (HOSPITAL_COMMUNITY): Payer: Self-pay

## 2024-05-15 ENCOUNTER — Inpatient Hospital Stay: Admit: 2024-05-15 | Discharge: 2024-05-15 | Disposition: A | Attending: Internal Medicine

## 2024-05-15 DIAGNOSIS — I5021 Acute systolic (congestive) heart failure: Secondary | ICD-10-CM

## 2024-05-15 DIAGNOSIS — J9601 Acute respiratory failure with hypoxia: Secondary | ICD-10-CM | POA: Diagnosis not present

## 2024-05-15 DIAGNOSIS — I5023 Acute on chronic systolic (congestive) heart failure: Secondary | ICD-10-CM | POA: Diagnosis not present

## 2024-05-15 DIAGNOSIS — G9341 Metabolic encephalopathy: Secondary | ICD-10-CM | POA: Diagnosis not present

## 2024-05-15 DIAGNOSIS — Z720 Tobacco use: Secondary | ICD-10-CM

## 2024-05-15 DIAGNOSIS — J441 Chronic obstructive pulmonary disease with (acute) exacerbation: Secondary | ICD-10-CM

## 2024-05-15 DIAGNOSIS — E785 Hyperlipidemia, unspecified: Secondary | ICD-10-CM

## 2024-05-15 LAB — CBC
HCT: 38.9 % — ABNORMAL LOW (ref 39.0–52.0)
Hemoglobin: 11.9 g/dL — ABNORMAL LOW (ref 13.0–17.0)
MCH: 30.4 pg (ref 26.0–34.0)
MCHC: 30.6 g/dL (ref 30.0–36.0)
MCV: 99.2 fL (ref 80.0–100.0)
Platelets: 203 K/uL (ref 150–400)
RBC: 3.92 MIL/uL — ABNORMAL LOW (ref 4.22–5.81)
RDW: 13.1 % (ref 11.5–15.5)
WBC: 4.3 K/uL (ref 4.0–10.5)
nRBC: 0 % (ref 0.0–0.2)

## 2024-05-15 LAB — BASIC METABOLIC PANEL WITH GFR
Anion gap: 9 (ref 5–15)
BUN: 24 mg/dL — ABNORMAL HIGH (ref 8–23)
CO2: 34 mmol/L — ABNORMAL HIGH (ref 22–32)
Calcium: 8.6 mg/dL — ABNORMAL LOW (ref 8.9–10.3)
Chloride: 98 mmol/L (ref 98–111)
Creatinine, Ser: 1.12 mg/dL (ref 0.61–1.24)
GFR, Estimated: >60 mL/min (ref >60)
Glucose, Bld: 147 mg/dL — ABNORMAL HIGH (ref 70–99)
Potassium: 3.8 mmol/L (ref 3.5–5.1)
Sodium: 141 mmol/L (ref 135–145)

## 2024-05-15 LAB — ECHOCARDIOGRAM COMPLETE
AR max vel: 3.36 cm2
AV Area VTI: 4.21 cm2
AV Area mean vel: 2.98 cm2
AV Mean grad: 2 mmHg
AV Peak grad: 4.2 mmHg
Ao pk vel: 1.02 m/s
Area-P 1/2: 3.24 cm2
Height: 66 in
P 1/2 time: 311 ms
S' Lateral: 4.54 cm
Single Plane A4C EF: 38.3 %
Weight: 2500.9 [oz_av]

## 2024-05-15 LAB — GLUCOSE, CAPILLARY: Glucose-Capillary: 138 mg/dL — ABNORMAL HIGH (ref 70–99)

## 2024-05-15 MED ORDER — FUROSEMIDE 10 MG/ML IJ SOLN
60.0000 mg | Freq: Two times a day (BID) | INTRAMUSCULAR | Status: DC
  Administered 2024-05-16 – 2024-05-17 (×3): 60 mg via INTRAVENOUS
  Filled 2024-05-15 (×4): qty 6

## 2024-05-15 MED ORDER — ATORVASTATIN CALCIUM 20 MG PO TABS
40.0000 mg | ORAL_TABLET | Freq: Every day | ORAL | Status: DC
  Administered 2024-05-16 – 2024-05-18 (×3): 40 mg via ORAL
  Filled 2024-05-15 (×4): qty 2

## 2024-05-15 MED ORDER — DAPAGLIFLOZIN PROPANEDIOL 10 MG PO TABS
10.0000 mg | ORAL_TABLET | Freq: Every day | ORAL | Status: DC
  Administered 2024-05-16 – 2024-05-18 (×3): 10 mg via ORAL
  Filled 2024-05-15 (×3): qty 1

## 2024-05-15 MED ORDER — IPRATROPIUM-ALBUTEROL 0.5-2.5 (3) MG/3ML IN SOLN: 3.0000 mL | Freq: Two times a day (BID) | RESPIRATORY_TRACT | Status: DC

## 2024-05-15 MED ORDER — IPRATROPIUM-ALBUTEROL 0.5-2.5 (3) MG/3ML IN SOLN
3.0000 mL | Freq: Two times a day (BID) | RESPIRATORY_TRACT | Status: DC
  Administered 2024-05-15 – 2024-05-17 (×6): 3 mL via RESPIRATORY_TRACT
  Filled 2024-05-15 (×6): qty 3

## 2024-05-15 MED ORDER — ALBUTEROL SULFATE (2.5 MG/3ML) 0.083% IN NEBU: 2.5000 mg | INHALATION_SOLUTION | Freq: Four times a day (QID) | RESPIRATORY_TRACT | Status: DC | PRN

## 2024-05-15 MED ORDER — SPIRONOLACTONE 25 MG PO TABS
25.0000 mg | ORAL_TABLET | Freq: Every day | ORAL | Status: DC
  Administered 2024-05-16 – 2024-05-18 (×3): 25 mg via ORAL
  Filled 2024-05-15 (×4): qty 1

## 2024-05-15 MED ORDER — AZITHROMYCIN 250 MG PO TABS
250.0000 mg | ORAL_TABLET | Freq: Every day | ORAL | Status: DC
  Administered 2024-05-15 – 2024-05-17 (×3): 250 mg via ORAL
  Filled 2024-05-15 (×3): qty 1

## 2024-05-15 MED ORDER — LOSARTAN POTASSIUM 50 MG PO TABS
50.0000 mg | ORAL_TABLET | Freq: Every day | ORAL | Status: DC
  Administered 2024-05-16: 50 mg via ORAL
  Filled 2024-05-15 (×2): qty 1

## 2024-05-15 MED ORDER — ASPIRIN 81 MG PO CHEW
81.0000 mg | CHEWABLE_TABLET | Freq: Every day | ORAL | Status: DC
  Administered 2024-05-16 – 2024-05-18 (×3): 81 mg via ORAL
  Filled 2024-05-15 (×4): qty 1

## 2024-05-15 NOTE — Assessment & Plan Note (Addendum)
 EF 30 to 35%.  Discharge meds include Lasix  40 mg daily, Entresto 24-26 mg twice a day, Farxiga 10 mg daily, spironolactone 25 mg daily, Toprol -XL started as an outpatient 12.5 mg daily.  During hospital course patient was diuresed with Lasix  60 mg IV twice daily and switched over to oral Lasix  on 11/9.

## 2024-05-15 NOTE — Progress Notes (Signed)
 Progress Note   Patient: Blake [name] FMW:[id number] DOB: [birth year] DOA: 05/14/2024     1 DOS: the patient was seen and examined on 05/15/2024   Brief hospital course: 68 y.o. year old male with past medical history of hypertension, hyperlipidemia, CAD status post stent placement, HFrEF (EF 35% in 05/2021), COPD presenting to the ED with worsening dyspnea.     Pt states over the last 3 to 4 days he has been developing dyspnea with exertion.  It has been worsening to where patient is not able to ambulate across the room without getting short of breath.  Prior to this he was able to ambulate without getting any shortness of breath.  He denies any fever or chills.  But did state he has a cough without any sputum production.  He does not report any sick contacts.     On arrival to the ED patient was noted to be HDS stable.  Lab work and imaging obtained.  CBC without leukocytosis, mild anemia.  CMP with elevated bicarb, but no AKI.  VBG with normal pH but elevated pCO2 at 73.  Respiratory panel negative for COVID flu and RSV.  BNP elevated at 950.  Troponin mildly elevated at 46, repeat pending.  Chest x-ray showing some signs of volume overload.  EDP gave patient Solu-Medrol , DuoNeb and placed him on BiPAP due to concern for tachypnea.  Given need for continued care [facility] was consulted.  11/6.  Patient was agitated upon arrival to the floor.  When I saw him he was answering all questions appropriately.    Assessment and Plan: * Acute respiratory failure with hypoxia and hypercapnia (HCC) Patient required BiPAP on presentation.  Patient had 1 pulse ox of 86% on room air.  pCO2 on presentation 73.  Now breathing on room air.  Since patient improved pretty quickly and more likely CHF but still likely has COPD component.  Acute on chronic systolic (congestive) heart failure (HCC) EF 30 to 35%.  Continue IV Lasix  40 mg twice a day.  Convert lisinopril  over to Cozaar.  Start spironolactone.  Hold  beta-blocker for right now.  Acute metabolic encephalopathy BiPAP on presentation.  At 1 pulse ox of 86% on room air.  This has improved by the time I saw him.  Could be secondary to being in the ER overnight, high pCO2 or steroids.  Continue to monitor.  COPD with acute exacerbation (HCC) Patient given Solu-Medrol  and switched over to prednisone .  Continue nebulizer treatments.  P.o. Zithromax.  Hyperlipidemia, unspecified On Lipitor  Tobacco abuse Nicotine  patch.  Patient stated he stopped smoking yesterday        Subjective: Patient stated he coming in with shortness of breath going on for 1 day.  He required BiPAP on presentation.  Feels better when I saw him today.  Still having audible wheeze.  Physical Exam: Vitals:   05/15/24 0124 05/15/24 0305 05/15/24 0438 05/15/24 0808  BP:  (!) 142/70  (!) 143/71  Pulse:  63  64  Resp:  20    Temp:  97.8 F (36.6 C)  97.6 F (36.4 C)  TempSrc:  Oral    SpO2: 96% 99%  100%  Weight:   70.9 kg   Height:       Physical Exam HENT:     Head: Normocephalic.     Mouth/Throat:     Pharynx: No oropharyngeal exudate.  Eyes:     General: Lids are normal.     Conjunctiva/sclera: Conjunctivae normal.  Cardiovascular:     Rate and Rhythm: Normal rate and regular rhythm.     Heart sounds: Normal heart sounds, S1 normal and S2 normal.  Pulmonary:     Breath sounds: Examination of the right-middle field reveals wheezing. Examination of the left-middle field reveals wheezing. Examination of the right-lower field reveals decreased breath sounds and wheezing. Examination of the left-lower field reveals decreased breath sounds and wheezing. Decreased breath sounds and wheezing present. No rhonchi or rales.     Comments: Audible wheeze heard. Abdominal:     Palpations: Abdomen is soft.     Tenderness: There is no abdominal tenderness.  Musculoskeletal:     Right lower leg: Swelling present.     Left lower leg: Swelling present.  Skin:     General: Skin is warm.     Findings: No rash.  Neurological:     Mental Status: He is alert.     Data Reviewed: Initial VBG showed a pH of 7.4 pCO2 of 73 sodium 141, potassium 3.8, glucose 147, creatinine 1.12, white blood cell count 4.3, hemoglobin 11.9, platelet count 203  Family Communication: Girlfriend at bedside and sister on the phone  Disposition: Status is: Inpatient Remains inpatient appropriate because: Continue diuresis  Planned Discharge Destination: Home with Home Health    Time spent: 35 minutes  Author: Charlie Patterson, MD 05/15/2024 2:16 PM  For on call review [email or web address].

## 2024-05-15 NOTE — Assessment & Plan Note (Addendum)
 Patient required BiPAP on presentation.  Patient had 1 pulse ox of 86% on room air.  pCO2 on presentation 73.  Now breathing on room air.  Since patient improved pretty quickly and more likely CHF but still likely has COPD component.  Off oxygen.

## 2024-05-15 NOTE — Plan of Care (Signed)
  Problem: Clinical Measurements: Goal: Ability to maintain clinical measurements within normal limits will improve Outcome: Progressing   Problem: Clinical Measurements: Goal: Cardiovascular complication will be avoided Outcome: Progressing   Problem: Activity: Goal: Risk for activity intolerance will decrease Outcome: Progressing

## 2024-05-15 NOTE — Assessment & Plan Note (Addendum)
 Patient given Solu-Medrol  and switched over to prednisone .    P.o. Zithromax.

## 2024-05-15 NOTE — Assessment & Plan Note (Signed)
 Patient required BiPAP on presentation.  1 pulse ox of 86% on room air.  This has improved by the time I saw him.  Could be secondary to being in the ER overnight, high pCO2 or steroids.  Mental status improved.

## 2024-05-15 NOTE — Consult Note (Addendum)
 [facility] CLINIC CARDIOLOGY CONSULT NOTE       Patient ID: Blake [name] MRN: [id number] DOB/AGE: [birth year] 68 y.o.  Admit date: 05/14/2024 Referring Physician Dr. Charlie Patterson Primary Physician Valerio Melanie DASEN, NP  Primary Cardiologist Dr. Florencio Reason for Consultation AoCHF  HPI: Blake [name] is a 68 y.o. male  with a past medical history of coronary artery disease s/p CABG and stenting, chronic HFrEF, hypertension, hyperlipidemia, COPD who presented to the ED on 05/14/2024 for worsening dyspnea. Cardiology was consulted for further evaluation.   Patient with worsening shortness of breath for the last 2 to 3 weeks.  He did see his PCP last week and they increased his Lasix  dose but his symptoms persisted despite this.  He saw them again yesterday and they sent him to the ED for further evaluation.  Workup in the ED notable for creatinine 1.09, potassium 3.7, hemoglobin 12.6, WBC 7.1. Troponins 46 > 44, BNP 950. EKG in the ED NSR 1st degree AVB, inferolateral T wave inversions (similar to prior). CXR on admission with mild pulmonary edema. Started on IV lasix  and IV steriods/abx/nebs.   At the time of my evaluation this afternoon, he is resting in bed comfortably with family at bedside.  We discussed his symptoms in further detail.  He endorses worsening shortness of breath and orthopnea for a few weeks along with swelling in his legs up to his knees.  He denies any episodes of chest pain or palpitations recently.  Also notes that he had wheezing prior to admission and still notes this today.  Endorses good urine output.  Review of systems complete and found to be negative unless listed above    Past Medical History:  Diagnosis Date   Acute headache    CHF (congestive heart failure) (HCC)    COPD (chronic obstructive pulmonary disease) (HCC)    Coronary artery disease    a. cath 2009 w/ severe 3-vessel dz, rec bypass; b. MI 2012-->4-vessel cabg; c. cath 2016: occluded  VG-OM and VG-RPDA, patent LIMA-LAD and patent VG-Diag, 95% stenosis of mid LCx s/p PCI/DES 0% residual stenosis   Depression    Hepatitis B    Hepatitis C    HFrEF (heart failure with reduced ejection fraction) (HCC)    a. LV gram 2009 with EF 32% with anterolateral and apical HK   Hyperlipidemia    Hypertension    MI (myocardial infarction) (HCC)    x 2   Tobacco abuse     Past Surgical History:  Procedure Laterality Date   CARDIAC CATHETERIZATION     CORONARY ANGIOPLASTY WITH STENT PLACEMENT  2009   CORONARY ARTERY BYPASS GRAFT  2012   [facility]   KNEE SURGERY     right knee    LEFT HEART CATH AND CORS/GRAFTS ANGIOGRAPHY N/A 06/09/2019   Procedure: LEFT HEART CATH AND CORS/GRAFTS ANGIOGRAPHY;  Surgeon: Darron Deatrice LABOR, MD;  Location: [facility] INVASIVE CV LAB;  Service: Cardiovascular;  Laterality: N/A;    Medications Prior to Admission  Medication Sig Dispense Refill Last Dose/Taking   albuterol  (VENTOLIN  HFA) 108 (90 Base) MCG/ACT inhaler Inhale 2 puffs into the lungs every 6 (six) hours as needed. 18 g 2 Unknown   aspirin  81 MG chewable tablet Chew 1 tablet (81 mg total) by mouth daily. 90 tablet 4 Past Week   atorvastatin  (LIPITOR) 40 MG tablet Take 1 tablet (40 mg total) by mouth daily. 90 tablet 4 Past Week   Cholecalciferol  (VITAMIN D3) 1.25 MG (50000 UT) CAPS Take  1 capsule (50,000 Units total) by mouth every 7 (seven) days. 12 capsule 4 Past Week   Cyanocobalamin  (B-12) 1000 MCG TABS Take 1 tablet (1,000 mcg total) by mouth daily. 90 tablet 4 Past Week   furosemide  (LASIX ) 40 MG tablet Take 1 tablet (40 mg total) by mouth daily. 90 tablet 4 Past Week   furosemide  (LASIX ) 80 MG tablet Take 1 tablet (80 mg total) by mouth daily for 7 days. 10 tablet 0 05/14/2024   lisinopril  (ZESTRIL ) 5 MG tablet Take 1 tablet (5 mg total) by mouth daily. 90 tablet 4 Past Week   metoprolol  succinate (TOPROL -XL) 25 MG 24 hr tablet Take 1 tablet (25 mg total) by mouth daily. 90 tablet 4 Past Week    nitroGLYCERIN  (NITROSTAT ) 0.4 MG SL tablet Place 1 tablet (0.4 mg total) under the tongue every 5 (five) minutes as needed for chest pain. Reported on 09/29/2015 25 tablet 1 Unknown   omeprazole  (PRILOSEC) 20 MG capsule Take 1 capsule (20 mg total) by mouth daily. 90 capsule 4 Past Week   rivastigmine  (EXELON ) 3 MG capsule Take 1 capsule (3 mg total) by mouth 2 (two) times daily. 180 capsule 4 Past Week   traZODone  (DESYREL ) 50 MG tablet Take 0.5-1 tablets (25-50 mg total) by mouth at bedtime as needed for sleep. 90 tablet 3 Unknown   umeclidinium-vilanterol (ANORO ELLIPTA ) 62.5-25 MCG/ACT AEPB Inhale 1 puff into the lungs daily at 6 (six) AM. 60 each 4 Past Week   Social History   Socioeconomic History   Marital status: Single    Spouse name: Not on file   Number of children: Not on file   Years of education: Not on file   Highest education level: Not on file  Occupational History   Not on file  Tobacco Use   Smoking status: Every Day    Current packs/day: 1.50    Average packs/day: 1.5 packs/day for 42.0 years (63.0 ttl pk-yrs)    Types: Cigarettes    Passive exposure: Past   Smokeless tobacco: Never  Vaping Use   Vaping status: Some Days  Substance and Sexual Activity   Alcohol use: No   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   Sexual activity: Not Currently  Other Topics Concern   Not on file  Social History Narrative   Not on file   Social Drivers of Health   Financial Resource Strain: Low Risk  (03/20/2023)   Overall Financial Resource Strain (CARDIA)    Difficulty of Paying Living Expenses: Not very hard  Food Insecurity: Food Insecurity Present (05/15/2024)   Hunger Vital Sign    Worried About Running Out of Food in the Last Year: Sometimes true    Ran Out of Food in the Last Year: Sometimes true  Transportation Needs: No Transportation Needs (05/15/2024)   PRAPARE - Administrator, Civil Service (Medical): No    Lack of Transportation (Non-Medical): No   Physical Activity: Inactive (03/20/2023)   Exercise Vital Sign    Days of Exercise per Week: 0 days    Minutes of Exercise per Session: 0 min  Stress: No Stress Concern Present (03/20/2023)   [employer] of Occupational Health - Occupational Stress Questionnaire    Feeling of Stress : Only a little  Social Connections: Socially Isolated (05/15/2024)   Social Connection and Isolation Panel    Frequency of Communication with Friends and Family: Twice a week    Frequency of Social Gatherings with Friends  and Family: Twice a week    Attends Religious Services: Never    Active Member of Clubs or Organizations: No    Attends Banker Meetings: Never    Marital Status: Separated  Intimate Partner Violence: Not At Risk (05/15/2024)   Humiliation, Afraid, Rape, and Kick questionnaire    Fear of Current or Ex-Partner: No    Emotionally Abused: No    Physically Abused: No    Sexually Abused: No    Family History  Problem Relation Age of Onset   Hypertension Mother    Hypertension Father    Hypertension Sister    Hypertension Sister      Vitals:   05/15/24 0124 05/15/24 0305 05/15/24 0438 05/15/24 0808  BP:  (!) 142/70  (!) 143/71  Pulse:  63  64  Resp:  20    Temp:  97.8 F (36.6 C)  97.6 F (36.4 C)  TempSrc:  Oral    SpO2: 96% 99%  100%  Weight:   70.9 kg   Height:        PHYSICAL EXAM General: Chronically ill-appearing male, well nourished, in no acute distress. HEENT: Normocephalic and atraumatic. Neck: No JVD.  Lungs: Normal respiratory effort on room air.  Wheezing noted bilaterally. Heart: HRRR. Normal S1 and S2 without gallops or murmurs.  Abdomen: Non-distended appearing.  Msk: Normal strength and tone for age. Extremities: Warm and well perfused. No clubbing, cyanosis.  Trace edema.  Neuro: Alert and oriented X 3. Psych: Answers questions appropriately.   Labs: Basic Metabolic Panel: Recent Labs    05/14/24 1541 05/14/24 1743 05/15/24 0343   NA 140  --  141  K 3.7  --  3.8  CL 97*  --  98  CO2 34*  --  34*  GLUCOSE 106*  --  147*  BUN 25*  --  24*  CREATININE 1.09  --  1.12  CALCIUM  8.9  --  8.6*  MG  --  1.8  --    Liver Function Tests: Recent Labs    05/14/24 1541  AST 33  ALT 18  ALKPHOS 93  BILITOT 0.6  PROT 8.0  ALBUMIN 3.0*   No results for input(s): LIPASE, AMYLASE in the last 72 hours. CBC: Recent Labs    05/14/24 1541 05/15/24 0343  WBC 7.1 4.3  NEUTROABS 5.1  --   HGB 12.6* 11.9*  HCT 41.0 38.9*  MCV 99.0 99.2  PLT 210 203   Cardiac Enzymes: Recent Labs    05/14/24 1541 05/14/24 1743  TROPONINIHS 46* 44*   BNP: Recent Labs    05/14/24 1541  BNP 950.0*   D-Dimer: No results for input(s): DDIMER in the last 72 hours. Hemoglobin A1C: No results for input(s): HGBA1C in the last 72 hours. Fasting Lipid Panel: No results for input(s): CHOL, HDL, LDLCALC, TRIG, CHOLHDL, LDLDIRECT in the last 72 hours. Thyroid  Function Tests: No results for input(s): TSH, T4TOTAL, T3FREE, THYROIDAB in the last 72 hours.  Invalid input(s): FREET3 Anemia Panel: No results for input(s): VITAMINB12, FOLATE, FERRITIN, TIBC, IRON, RETICCTPCT in the last 72 hours.   Radiology: ECHOCARDIOGRAM COMPLETE Result Date: 05/15/2024    ECHOCARDIOGRAM REPORT   Patient Name:   Blake [name] Date of Exam: 05/15/2024 Medical Rec #:  [id number]         Height:       66.0 in Accession #:    [id number]        Weight:  156.3 lb Date of Birth:  May 04, 1956        BSA:          1.801 m Patient Age:    68 years          BP:           142/70 mmHg Patient Gender: M                 HR:           63 bpm. Exam Location:  [facility] Procedure: 2D Echo, Cardiac Doppler and Color Doppler (Both Spectral and Color            Flow Doppler were utilized during procedure). Indications:     CHF-acute systolic I50.21  History:         Patient has prior history of Echocardiogram examinations, most                   recent 06/08/2019. CHF, Previous Myocardial Infarction, COPD;                  Risk Factors:Hypertension. Tobacco use.  Sonographer:     Christopher Furnace Referring Phys:  [id number] [facility] Diagnosing Phys: Evalene Lunger MD IMPRESSIONS  1. Left ventricular ejection fraction, by estimation, is 30 to 35%. Left ventricular ejection fraction by PLAX is 35 %. The left ventricle has moderately decreased function. The left ventricle demonstrates regional wall motion abnormalities (global hypokinesis with severe hypokinesis of the anterior, anterospetal and lateral wall ). Left ventricular diastolic parameters are consistent with Grade I diastolic dysfunction (impaired relaxation).  2. Right ventricular systolic function is mildly reduced. The right ventricular size is normal. There is moderately elevated pulmonary artery systolic pressure. The estimated right ventricular systolic pressure is 53.6 mmHg.  3. Large pleural effusion in the left lateral region.  4. The mitral valve is normal in structure. Moderate mitral valve regurgitation. No evidence of mitral stenosis.  5. Tricuspid valve regurgitation is moderate.  6. The aortic valve is tricuspid. Aortic valve regurgitation is mild. Aortic valve sclerosis is present, with no evidence of aortic valve stenosis.  7. The inferior vena cava is dilated in size with >50% respiratory variability, suggesting right atrial pressure of 8 mmHg. FINDINGS  Left Ventricle: Left ventricular ejection fraction, by estimation, is 30 to 35%. Left ventricular ejection fraction by PLAX is 35 %. The left ventricle has moderately decreased function. The left ventricle demonstrates regional wall motion abnormalities. Strain was performed and the global longitudinal strain is indeterminate. The left ventricular internal cavity size was normal in size. There is no left ventricular hypertrophy. Left ventricular diastolic parameters are consistent with Grade I diastolic dysfunction (impaired  relaxation). Right Ventricle: The right ventricular size is normal. No increase in right ventricular wall thickness. Right ventricular systolic function is mildly reduced. There is moderately elevated pulmonary artery systolic pressure. The tricuspid regurgitant velocity is 3.30 m/s, and with an assumed right atrial pressure of 10 mmHg, the estimated right ventricular systolic pressure is 53.6 mmHg. Left Atrium: Left atrial size was normal in size. Right Atrium: Right atrial size was normal in size. Pericardium: There is no evidence of pericardial effusion. Mitral Valve: The mitral valve is normal in structure. Moderate mitral valve regurgitation. No evidence of mitral valve stenosis. Tricuspid Valve: The tricuspid valve is normal in structure. Tricuspid valve regurgitation is moderate . No evidence of tricuspid stenosis. Aortic Valve: The aortic valve is tricuspid. Aortic valve regurgitation is mild. Aortic regurgitation PHT  measures 311 msec. Aortic valve sclerosis is present, with no evidence of aortic valve stenosis. Aortic valve mean gradient measures 2.0 mmHg. Aortic valve peak gradient measures 4.2 mmHg. Aortic valve area, by VTI measures 4.21 cm. Pulmonic Valve: The pulmonic valve was normal in structure. Pulmonic valve regurgitation is mild. No evidence of pulmonic stenosis. Aorta: The aortic root is normal in size and structure. Venous: The inferior vena cava is dilated in size with greater than 50% respiratory variability, suggesting right atrial pressure of 8 mmHg. IAS/Shunts: No atrial level shunt detected by color flow Doppler. Additional Comments: 3D was performed not requiring image post processing on an independent workstation and was indeterminate. There is a large pleural effusion in the left lateral region.  LEFT VENTRICLE PLAX 2D LV EF:         Left            Diastology                ventricular     LV e' medial:    5.03 cm/s                ejection        LV E/e' medial:  21.1                 fraction by     LV e' lateral:   6.09 cm/s                PLAX is 35      LV E/e' lateral: 17.4                %. LVIDd:         5.48 cm LVIDs:         4.54 cm LV PW:         1.33 cm LV IVS:        0.91 cm LVOT diam:     2.00 cm LV SV:         73 LV SV Index:   41 LVOT Area:     3.14 cm LV IVRT:       81 msec  LV Volumes (MOD) LV vol d, MOD    108.0 ml A2C: LV vol d, MOD    201.0 ml A4C: LV vol s, MOD    124.0 ml A4C: LV SV MOD A4C:   201.0 ml RIGHT VENTRICLE RV Basal diam:  4.25 cm RV Mid diam:    3.47 cm LEFT ATRIUM           Index        RIGHT ATRIUM           Index LA diam:      3.40 cm 1.89 cm/m   RA Area:     18.70 cm LA Vol (A4C): 57.1 ml 31.71 ml/m  RA Volume:   55.90 ml  31.04 ml/m  AORTIC VALVE                    PULMONIC VALVE AV Area (Vmax):    3.36 cm     PR End Diast Vel: 17.47 msec AV Area (Vmean):   2.98 cm AV Area (VTI):     4.21 cm AV Vmax:           102.00 cm/s AV Vmean:          65.700 cm/s AV VTI:            0.174 m AV  Peak Grad:      4.2 mmHg AV Mean Grad:      2.0 mmHg LVOT Vmax:         109.00 cm/s LVOT Vmean:        62.300 cm/s LVOT VTI:          0.233 m LVOT/AV VTI ratio: 1.34 AI PHT:            311 msec  AORTA Ao Root diam: 3.30 cm MITRAL VALVE                TRICUSPID VALVE MV Area (PHT): 3.24 cm     TR Peak grad:   43.6 mmHg MV Decel Time: 234 msec     TR Vmax:        330.00 cm/s MV E velocity: 106.00 cm/s MV A velocity: 42.70 cm/s   SHUNTS MV E/A ratio:  2.48         Systemic VTI:  0.23 m                             Systemic Diam: 2.00 cm Evalene Lunger MD Electronically signed by Evalene Lunger MD Signature Date/Time: 05/15/2024/1:08:30 PM    Final    DG Chest Portable 1 View Result Date: 05/14/2024 EXAM: 1 VIEW(S) XRAY OF THE CHEST 05/14/2024 03:54:41 PM COMPARISON: 02/08/2024 CLINICAL HISTORY: short of breath FINDINGS: LUNGS AND PLEURA: Mild diffuse interstitial prominence. No focal pulmonary opacity. No pulmonary edema. No pleural effusion. No pneumothorax. HEART AND  MEDIASTINUM: Mild cardiomegaly. Aortic atherosclerosis. Post-CABG changes noted. Mild central vascular prominence. BONES AND SOFT TISSUES: Intact sternotomy wires. No acute osseous abnormality. IMPRESSION: 1. Mild diffuse interstitial prominence, which may reflect early pulmonary edema due to CHF treatment . Electronically signed by: Waddell Calk MD 05/14/2024 04:13 PM EST RP Workstation: [id number]    ECHO as above  LHC 05/2019: 1.  Significant underlying three-vessel coronary artery disease with patent LIMA to LAD and SVG to diagonal.  Patent stent in the left circumflex with no significant restenosis.  Known chronically occluded small right coronary artery with bridging and left-to-right collaterals.  Chronically occluded SVG to RCA and SVG to OM.  No significant change in coronary anatomy since 2016. 2.  Mildly to moderately reduced LV systolic function with an EF of 40 to 45% with anterior apical hypokinesis.  Mildly elevated left ventricular end-diastolic pressure at 20 mmHg.  TELEMETRY (personally reviewed): Sinus rhythm first-degree AVB rate 80s  EKG (personally reviewed): NSR 1st degree AVB, inferolateral T wave inversions (similar to prior)  Data reviewed by me 05/15/2024: last 24h vitals tele labs imaging I/O ED provider note, admission H&P  Principal Problem:   Acute respiratory failure with hypoxia and hypercapnia (HCC) Active Problems:   S/P CABG (coronary artery bypass graft)   Tobacco abuse   Benign prostatic hyperplasia with urinary obstruction   Hyperlipidemia, unspecified   Acute on chronic systolic (congestive) heart failure (HCC)   COPD with acute exacerbation (HCC)   Acute metabolic encephalopathy    ASSESSMENT AND PLAN:  Blake [name] is a 68 y.o. male  with a past medical history of coronary artery disease s/p CABG and stenting, chronic HFrEF, hypertension, hyperlipidemia, COPD who presented to the ED on 05/14/2024 for worsening dyspnea. Cardiology was consulted  for further evaluation.    # Acute on chronic HFrEF # Coronary artery disease s/p CABG, stenting # Demand ischemia # COPD exacerbation Patient presented with 2 weeks of  worsening shortness of breath, leg swelling, wheezing.  Saw by PCP last week and Lasix  was increased at that time but he had persistence of symptoms thus was sent to the ED.  BNP elevated at 950.  Chest x-ray consistent with pulmonary edema.  He was started on IV diuretics as well as treatment for suspected COPD exacerbation.  Echo this admission with reduced EF 30-35%, wall motion abnormalities relatively similar from prior. - Increase IV Lasix  to 60 mg twice daily. - Continue losartan 50 mg daily, spironolactone 25 mg daily.  Recommend resuming beta-blocker once closer to euvolemia.  Copay of Farxiga/Jardiance, Entresto - $0/mo. - Minimal troponin elevation most consistent with demand/supply mismatch and not ACS in the setting of acute heart failure exacerbation and COPD exacerbation. - Continue atorvastatin  40 mg daily, aspirin  81 mg. - Further management of COPD exacerbation as per primary team.   This patient's plan of care was discussed and created with Dr. Florencio and he is in agreement.  Signed: Danita Bloch, PA-C  05/15/2024, 3:08 PM [facility]

## 2024-05-15 NOTE — Evaluation (Signed)
 Physical Therapy Evaluation Patient Details Name: Blake [name] MRN: [id number] DOB: [birth year] Today's Date: 05/15/2024  History of Present Illness  Pt is a 68 y/o M presenting to ED with c/o worsening dyspnea. MD assessment includes acute respiratory failure with hypercapnia secondary to COPD and CHF exacerbation. PMH significant for HTN, HLD, CAD s/p stent placement, HFrEF, COPD.   Clinical Impression  Pt A&Ox2, pleasant and agreeable to participate in PT evaluation. Pt is a poor historian, but reports at baseline he is IND with mobility/ADLs, denies hx of falls. Pt was received in bed, performed bed mobility with supervision and STS transfers with CGA with VC for hand placement on RW. Pt amb ~150ft with RW, demonstrated consistent scissoring and narrow stance gait pattern that was mildly corrected with verbal cuing, required max multimodal cues for RW positioning due to poor safety awareness with RW use. HR and SpO2 monitored during activity with pt on RA with SpO2 ranging from 86-91% HR low 80s-high 90s- RN informed. Pt was left sitting in recliner at end of session with all needs in reach, sitter and visitor present in room. Pt would benefit from skilled PT intervention to address listed deficits (see PT Problem List) and allow for safe return to PLOF.       If plan is discharge home, recommend the following: A little help with walking and/or transfers;A little help with bathing/dressing/bathroom;Assist for transportation;Help with stairs or ramp for entrance;Direct supervision/assist for medications management;Direct supervision/assist for financial management;Assistance with cooking/housework   Can travel by private vehicle        Equipment Recommendations Rolling walker (2 wheels)  Recommendations for Other Services       Functional Status Assessment Patient has had a recent decline in their functional status and demonstrates the ability to make significant improvements in  function in a reasonable and predictable amount of time.     Precautions / Restrictions Precautions Precautions: Fall Recall of Precautions/Restrictions: Impaired Restrictions Weight Bearing Restrictions Per Provider Order: No      Mobility  Bed Mobility Overal bed mobility: Needs Assistance Bed Mobility: Supine to Sit     Supine to sit: Supervision, Used rails     General bed mobility comments: no physical assistance required, use of bed rail to assist with trunk rising    Transfers Overall transfer level: Needs assistance Equipment used: Rolling walker (2 wheels) Transfers: Sit to/from Stand Sit to Stand: Contact guard assist           General transfer comment: STS from EOB with CGA for safety and to control descent to sitting. VC for hand placement on RW    Ambulation/Gait Ambulation/Gait assistance: Contact guard assist Gait Distance (Feet): 150 Feet Assistive device: Rolling walker (2 wheels) Gait Pattern/deviations: Step-through pattern, Decreased dorsiflexion - right, Decreased dorsiflexion - left, Narrow base of support, Scissoring       General Gait Details: No overt LOB throughout, demonstrated consistent scissoring gait pattern throughout that pt was able to intermittently correct with consistent cuing. Notable L knee varus and decreased dorsiflexion bilaterally with instances of L toes catching on ground. Required max multimodal cues for RW positioning, tendency to step outside RW frame  Stairs            Wheelchair Mobility     Tilt Bed    Modified Rankin (Stroke Patients Only)       Balance Overall balance assessment: Needs assistance Sitting-balance support: Feet supported Sitting balance-Leahy Scale: Fair Sitting balance - Comments: steady static sitting  Standing balance support: Bilateral upper extremity supported, Reliant on assistive device for balance Standing balance-Leahy Scale: Fair Standing balance comment: increased  stability with UE support                             Pertinent Vitals/Pain Pain Assessment Pain Assessment: No/denies pain    Home Living Family/patient expects to be discharged to:: Private residence Living Arrangements: Other relatives (sister, brother, niece) Available Help at Discharge: Family;Available PRN/intermittently Type of Home: House Home Access: Stairs to enter Entrance Stairs-Rails: None Entrance Stairs-Number of Steps: 2   Home Layout: One level Home Equipment: Shower seat Additional Comments: pt is a questionable historian, unsure how accurate home living is- per [facility] note pt lives at [facility] (group home?)    Prior Function Prior Level of Function : Independent/Modified Independent;Driving;Patient poor historian/Family not available             Mobility Comments: Initially stated that he shares a RW with his sister for ambulation, later stated he does not use any AD. Denies hx of falls ADLs Comments: IND with ADLs     Extremity/Trunk Assessment   Upper Extremity Assessment Upper Extremity Assessment: Generalized weakness    Lower Extremity Assessment Lower Extremity Assessment: Generalized weakness       Communication   Communication Communication: Impaired Factors Affecting Communication: Reduced clarity of speech    Cognition Arousal: Alert Behavior During Therapy: Impulsive   PT - Cognitive impairments: Orientation   Orientation impairments: Time, Situation                   PT - Cognition Comments: Oriented to person and place only, impulsive with mobility and RW Following commands: Impaired Following commands impaired: Follows one step commands with increased time     Cueing Cueing Techniques: Verbal cues, Tactile cues, Visual cues     General Comments      Exercises Other Exercises Other Exercises: HR and SpO2 monitored throughout session with pt on RA: SpO2 90-91% at rest HR low 80s. After  transfer to sitting EOB SpO2 88-90% HR high 80s. Assessed intermittently during ambulation, SpO2 ranged between 88-91% HR in mid to high 90s. Immediately following ambulation SpO2 86% improved to 89-90% with rest- RN informed   Assessment/Plan    PT Assessment Patient needs continued PT services  PT Problem List Decreased strength;Decreased activity tolerance;Decreased balance;Decreased mobility;Decreased coordination;Decreased cognition;Decreased knowledge of use of DME;Decreased safety awareness;Cardiopulmonary status limiting activity       PT Treatment Interventions DME instruction;Gait training;Stair training;Functional mobility training;Therapeutic activities;Therapeutic exercise;Balance training;Cognitive remediation;Patient/family education    PT Goals (Current goals can be found in the Care Plan section)  Acute Rehab PT Goals Patient Stated Goal: to go home PT Goal Formulation: With patient Time For Goal Achievement: 05/29/24 Potential to Achieve Goals: Fair    Frequency Min 2X/week     Co-evaluation               AM-PAC PT 6 Clicks Mobility  Outcome Measure Help needed turning from your back to your side while in a flat bed without using bedrails?: None Help needed moving from lying on your back to sitting on the side of a flat bed without using bedrails?: A Little Help needed moving to and from a bed to a chair (including a wheelchair)?: A Little Help needed standing up from a chair using your arms (e.g., wheelchair or bedside chair)?: A Little Help  needed to walk in hospital room?: A Little Help needed climbing 3-5 steps with a railing? : A Lot 6 Click Score: 18    End of Session Equipment Utilized During Treatment: Gait belt Activity Tolerance: Patient tolerated treatment well Patient left: in chair;with call bell/phone within reach;with nursing/sitter in room;with family/visitor present Nurse Communication: Mobility status;Other (comment) (SpO2 with walk  test) PT Visit Diagnosis: Unsteadiness on feet (R26.81);Muscle weakness (generalized) (M62.81);Other abnormalities of gait and mobility (R26.89)    Time: 8543-8476 PT Time Calculation (min) (ACUTE ONLY): 27 min   Charges:   PT Evaluation $PT Eval Moderate Complexity: 1 Mod PT Treatments $Gait Training: 8-22 mins PT General Charges $$ ACUTE PT VISIT: 1 Visit         Janell Axe, SPT

## 2024-05-15 NOTE — Progress Notes (Signed)
 Patient seen in office yesterday and sent by EMS to [facility].

## 2024-05-15 NOTE — Telephone Encounter (Signed)
 Pharmacy Patient Advocate Encounter  Insurance verification completed.    The patient is insured through [facility]. Patient has Medicare and is not eligible for a copay card, but may be able to apply for patient assistance or Medicare RX Payment Plan (Patient Must reach out to their plan, if eligible for payment plan), if available.    Ran test claim for Brand Entresto 24-26mg  and the current 30 day co-pay is $0.  Ran test claim for Jardiance 10mg  and the current 30 day co-pay is $0.  Ran test claim for Farxiga 10mg  and the current 30 day co-pay is $0.   This test claim was processed through [employer]- copay amounts may vary at other pharmacies due to [employer], or as the patient moves through the different stages of their insurance plan.

## 2024-05-15 NOTE — Progress Notes (Signed)
*  PRELIMINARY RESULTS* Echocardiogram 2D Echocardiogram has been performed.  Blake [name] 05/15/2024, 9:11 AM

## 2024-05-15 NOTE — TOC Initial Note (Signed)
 Transition of Care [facility]) - Initial/Assessment Note    Patient Details  Name: Blake [name] MRN: [id number] Date of Birth: [birth year]  Transition of Care [facility] CM/SW Contact:    Lauraine JAYSON Carpen, LCSW Phone Number: 05/15/2024, 3:38 PM  Clinical Narrative:   CSW met with patient. Significant other at bedside. CSW introduced role and explained that discharge planning would be discussed. Patient confirmed he lives at [facility] and said his sister [name] owns it. CSW tried calling [name] but the phone hung up after answering. Will try again later. No further concerns. CSW will continue to follow patient for support and facilitate return to group home once medically stable. Sister will likely transport at discharge.               Expected Discharge Plan: Group Home Barriers to Discharge: Continued Medical Work up   Patient Goals and CMS Choice            Expected Discharge Plan and Services     Post Acute Care Choice: NA Living arrangements for the past 2 months: Group Home                                      Prior Living Arrangements/Services Living arrangements for the past 2 months: Group Home Lives with:: Facility Resident Patient language and need for interpreter reviewed:: Yes Do you feel safe going back to the place where you live?: Yes      Need for Family Participation in Patient Care: Yes (Comment) Care giver support system in place?: Yes (comment)   Criminal Activity/Legal Involvement Pertinent to Current Situation/Hospitalization: No - Comment as needed  Activities of Daily Living   ADL Screening (condition at time of admission) Independently performs ADLs?: Yes (appropriate for developmental age) Is the patient deaf or have difficulty hearing?: No Does the patient have difficulty seeing, even when wearing glasses/contacts?: No Does the patient have difficulty concentrating, remembering, or making decisions?: No  Permission  Sought/Granted Permission sought to share information with : Facility Medical Sales Representative, Family Supports Permission granted to share information with : Yes, Verbal Permission Granted  Share Information with NAME: [name] Muzzy  Permission granted to share info w AGENCY: [facility]  Permission granted to share info w Relationship: Sister/Group Home owner  Permission granted to share info w Contact Information: [phone]  Emotional Assessment Appearance:: Appears stated age Attitude/Demeanor/Rapport: Engaged, Gracious Affect (typically observed): Accepting, Appropriate, Calm, Pleasant Orientation: : Oriented to Self, Oriented to Place, Oriented to  Time, Oriented to Situation Alcohol / Substance Use: Not Applicable Psych Involvement: No (comment)  Admission diagnosis:  COPD exacerbation (HCC) [J44.1] Respiratory failure with hypercapnia (HCC) [J96.92] Acute on chronic respiratory failure with hypercapnia (HCC) [J96.22] Acute on chronic congestive heart failure, unspecified heart failure type [facility]) [I50.9] Patient Active Problem List   Diagnosis Date Noted   Acute respiratory failure with hypoxia and hypercapnia (HCC) 05/15/2024   COPD with acute exacerbation (HCC) 05/15/2024   Acute metabolic encephalopathy 05/15/2024   Acute on chronic systolic (congestive) heart failure (HCC) 05/14/2024   B12 deficiency 07/29/2023   Vitamin D  deficiency 01/28/2023   IFG (impaired fasting glucose) 11/15/2022   Right inguinal hernia 11/07/2022   Dementia without behavioral disturbance (HCC) 05/14/2022   Chronic pain of right knee 08/15/2021   Intellectual disability 04/15/2021   Benign prostatic hyperplasia with urinary obstruction 09/29/2015   Nicotine  dependence, chewing tobacco, uncomplicated 09/29/2015  Clinical depression 09/29/2015   Essential (primary) hypertension 09/29/2015   Hyperlipidemia, unspecified 09/29/2015   H/O acute myocardial infarction 09/29/2015   S/P CABG  (coronary artery bypass graft) 09/16/2014   Coronary artery disease involving native coronary artery of native heart with other form of angina pectoris 09/16/2014   Tobacco abuse 09/16/2014   Centrilobular emphysema (HCC) 09/16/2014   Chronic hepatitis C (HCC) 01/26/2011   PCP:  Valerio Melanie DASEN, NP Pharmacy:   [facility] [address] [place] [address] Phone: [phone] Fax: [phone]     Social Drivers of Health (SDOH) Social History: SDOH Screenings   Food Insecurity: Food Insecurity Present (05/15/2024)  Housing: Low Risk  (05/15/2024)  Transportation Needs: No Transportation Needs (05/15/2024)  Utilities: Not At Risk (05/15/2024)  Alcohol Screen: Low Risk  (03/20/2023)  Depression (PHQ2-9): Low Risk  (12/19/2023)  Financial Resource Strain: Low Risk  (03/20/2023)  Physical Activity: Inactive (03/20/2023)  Social Connections: Socially Isolated (05/15/2024)  Stress: No Stress Concern Present (03/20/2023)  Tobacco Use: High Risk (05/14/2024)  Health Literacy: Inadequate Health Literacy (03/20/2023)   SDOH Interventions:     Readmission Risk Interventions     No data to display

## 2024-05-15 NOTE — Progress Notes (Signed)
 Heart Failure Navigator Progress Note Advanced Heart Failure Team patient.  Recent hospitalization on 05/14/2024 for Chronic Respiratory failure, COPD exacerbation and CHF. Scheduled first available hospital follow-up on 05/27/2024 @ 9:30 AM.  Patient says his sister drives him to appointments.   Education Assessment and Provision: Detailed education and instructions provided on heart failure disease management including the following:  Signs and symptoms of Heart Failure When to call the physician Importance of daily weights Low sodium diet Fluid restriction Medication management Anticipated future follow-up appointments  Patient education given on each of the above topics.  Patient acknowledges understanding via teach back method and acceptance of all instructions.  Education Materials:  Living Better With Heart Failure Booklet, HF zone tool, & Daily Weight Tracker Tool.  Patient has scale at home: Yes Patient has pill box at home: Per patient he is compliant with medications.    Navigator will sign off at this time.  Charmaine Pines, RN, BSN [facility] Heart Failure Navigator Secure Chat Only

## 2024-05-15 NOTE — Hospital Course (Addendum)
 68 y.o. year old male with past medical history of hypertension, hyperlipidemia, CAD status post stent placement, HFrEF (EF 35% in 05/2021), COPD presenting to the ED with worsening dyspnea.     Pt states over the last 3 to 4 days he has been developing dyspnea with exertion.  It has been worsening to where patient is not able to ambulate across the room without getting short of breath.  Prior to this he was able to ambulate without getting any shortness of breath.  He denies any fever or chills.  But did state he has a cough without any sputum production.  He does not report any sick contacts.     On arrival to the ED patient was noted to be HDS stable.  Lab work and imaging obtained.  CBC without leukocytosis, mild anemia.  CMP with elevated bicarb, but no AKI.  VBG with normal pH but elevated pCO2 at 73.  Respiratory panel negative for COVID flu and RSV.  BNP elevated at 950.  Troponin mildly elevated at 46, repeat pending.  Chest x-ray showing some signs of volume overload.  EDP gave patient Solu-Medrol , DuoNeb and placed him on BiPAP due to concern for tachypnea.  Given need for continued care TRH was consulted.  11/6.  Patient was agitated upon arrival to the floor.  When I saw him he was answering all questions appropriately.  Cardiology increase Lasix  to 60 mg IV twice daily. 11/7.  Patient states he is breathing better.  Still has a little bit of a wheeze.  Will switch losartan over to [facility] tomorrow. 11/8.  Blood pressure low after starting Entresto today.  Will cut back dose from this evening.  Change Lasix  over to oral for tomorrow. 11/9.  Patient feeling well wanting to go home.

## 2024-05-15 NOTE — Assessment & Plan Note (Signed)
 Nicotine  patch.  Patient stated he stopped smoking yesterday

## 2024-05-15 NOTE — Assessment & Plan Note (Signed)
 On Lipitor

## 2024-05-16 DIAGNOSIS — J441 Chronic obstructive pulmonary disease with (acute) exacerbation: Secondary | ICD-10-CM | POA: Diagnosis not present

## 2024-05-16 DIAGNOSIS — I5023 Acute on chronic systolic (congestive) heart failure: Secondary | ICD-10-CM | POA: Diagnosis not present

## 2024-05-16 DIAGNOSIS — R413 Other amnesia: Secondary | ICD-10-CM | POA: Insufficient documentation

## 2024-05-16 DIAGNOSIS — J9601 Acute respiratory failure with hypoxia: Secondary | ICD-10-CM | POA: Diagnosis not present

## 2024-05-16 DIAGNOSIS — G9341 Metabolic encephalopathy: Secondary | ICD-10-CM | POA: Diagnosis not present

## 2024-05-16 LAB — BASIC METABOLIC PANEL WITH GFR
Anion gap: 10 (ref 5–15)
BUN: 31 mg/dL — ABNORMAL HIGH (ref 8–23)
CO2: 36 mmol/L — ABNORMAL HIGH (ref 22–32)
Calcium: 9 mg/dL (ref 8.9–10.3)
Chloride: 95 mmol/L — ABNORMAL LOW (ref 98–111)
Creatinine, Ser: 1.1 mg/dL (ref 0.61–1.24)
GFR, Estimated: >60 mL/min (ref >60)
Glucose, Bld: 80 mg/dL (ref 70–99)
Potassium: 3.6 mmol/L (ref 3.5–5.1)
Sodium: 141 mmol/L (ref 135–145)

## 2024-05-16 LAB — CBC
HCT: 40.4 % (ref 39.0–52.0)
Hemoglobin: 12.7 g/dL — ABNORMAL LOW (ref 13.0–17.0)
MCH: 30.8 pg (ref 26.0–34.0)
MCHC: 31.4 g/dL (ref 30.0–36.0)
MCV: 98.1 fL (ref 80.0–100.0)
Platelets: 225 K/uL (ref 150–400)
RBC: 4.12 MIL/uL — ABNORMAL LOW (ref 4.22–5.81)
RDW: 12.9 % (ref 11.5–15.5)
WBC: 9.6 K/uL (ref 4.0–10.5)
nRBC: 0 % (ref 0.0–0.2)

## 2024-05-16 LAB — GLUCOSE, CAPILLARY: Glucose-Capillary: 119 mg/dL — ABNORMAL HIGH (ref 70–99)

## 2024-05-16 MED ORDER — PANTOPRAZOLE SODIUM 40 MG PO TBEC
40.0000 mg | DELAYED_RELEASE_TABLET | Freq: Every day | ORAL | Status: DC
  Administered 2024-05-16 – 2024-05-18 (×3): 40 mg via ORAL
  Filled 2024-05-16 (×3): qty 1

## 2024-05-16 MED ORDER — SACUBITRIL-VALSARTAN 49-51 MG PO TABS
1.0000 | ORAL_TABLET | Freq: Two times a day (BID) | ORAL | Status: DC
  Administered 2024-05-17: 1 via ORAL
  Filled 2024-05-16: qty 1

## 2024-05-16 MED ORDER — POTASSIUM CHLORIDE CRYS ER 20 MEQ PO TBCR
20.0000 meq | EXTENDED_RELEASE_TABLET | Freq: Once | ORAL | Status: AC
  Administered 2024-05-16: 20 meq via ORAL
  Filled 2024-05-16: qty 1

## 2024-05-16 MED ORDER — TRAZODONE HCL 50 MG PO TABS
25.0000 mg | ORAL_TABLET | Freq: Every evening | ORAL | Status: DC | PRN
Start: 2024-05-16 — End: 2024-05-18

## 2024-05-16 MED ORDER — RIVASTIGMINE TARTRATE 3 MG PO CAPS
3.0000 mg | ORAL_CAPSULE | Freq: Two times a day (BID) | ORAL | Status: DC
  Administered 2024-05-16 – 2024-05-18 (×4): 3 mg via ORAL
  Filled 2024-05-16 (×4): qty 1

## 2024-05-16 MED ORDER — UMECLIDINIUM-VILANTEROL 62.5-25 MCG/ACT IN AEPB
1.0000 | INHALATION_SPRAY | Freq: Every day | RESPIRATORY_TRACT | Status: DC
  Administered 2024-05-17 – 2024-05-18 (×2): 1 via RESPIRATORY_TRACT
  Filled 2024-05-16 (×2): qty 14

## 2024-05-16 NOTE — Assessment & Plan Note (Signed)
 On Exelon

## 2024-05-16 NOTE — Progress Notes (Signed)
 Heart Failure Stewardship Pharmacy Note  PCP: Valerio Melanie DASEN, NP PCP-Cardiologist: Dr. Florencio  HPI: Blake [name] is a 68 y.o. male with coronary artery disease s/p CABG and stenting, chronic HFrEF, hypertension, hyperlipidemia, COPD who presented with worsening shortness of breath with exertion. On admission, BNP was 950, HS-troponin was 46. Chest x-ray noted mild diffuse interstitial prominence.    Pertinent cardiac history: LHC in 04/2008 noted significant 3-vessel disease. Patient underwent CABG. LHC 10/2014 noted patent LIMA to LAD with restenosis of SVG to OM and RCA. Intervention performed to Lcx with DES. TTE 05/2019 noted LVEF of 35-40%, mild-moderate MR, mild TR, mild to moderate AR. LHC 04/2019 results similar to 2016. TTE 05/2024 noted LVEF 30-35% with G1DD, mild RV dysfunction, moderate MR, moderate TR, mild AR.   Pertinent Lab Values: Creatinine  Date Value Ref Range Status  10/27/2014 0.91 mg/dL Final    Comment:    9.38-8.75 NOTE: New Reference Range  09/15/14    Creatinine, Ser  Date Value Ref Range Status  05/16/2024 1.10 0.61 - 1.24 mg/dL Final   BUN  Date Value Ref Range Status  05/16/2024 31 (H) 8 - 23 mg/dL Final  89/70/7974 20 8 - 27 mg/dL Final  95/80/7983 16 mg/dL Final    Comment:    3-79 NOTE: New Reference Range  09/15/14    Potassium  Date Value Ref Range Status  05/16/2024 3.6 3.5 - 5.1 mmol/L Final  10/27/2014 3.6 mmol/L Final    Comment:    3.5-5.1 NOTE: New Reference Range  09/15/14    Sodium  Date Value Ref Range Status  05/16/2024 141 135 - 145 mmol/L Final  05/07/2024 142 134 - 144 mmol/L Final  10/27/2014 137 mmol/L Final    Comment:    135-145 NOTE: New Reference Range  09/15/14    B Natriuretic Peptide  Date Value Ref Range Status  05/14/2024 950.0 (H) 0.0 - 100.0 pg/mL Final    Comment:    Performed at [facility], [address] [place], [place] [address]   Magnesium  Date Value Ref Range  Status  05/14/2024 1.8 1.7 - 2.4 mg/dL Final    Comment:    Performed at [facility], [address] [place], [place] [address]   HB A1C ([facility])  Date Value Ref Range Status  12/19/2023 5.8 (H) 4.8 - 5.6 % Final    Comment:             Prediabetes: 5.7 - 6.4          Diabetes: >6.4          Glycemic control for adults with diabetes: <7.0    TSH  Date Value Ref Range Status  08/14/2023 1.650 0.450 - 4.500 uIU/mL Final    Vital Signs:  Temp:  [97.6 F (36.4 C)-98.1 F (36.7 C)] 98.1 F (36.7 C) (11/06 1711) Pulse Rate:  [64-75] 75 (11/06 1711) Cardiac Rhythm: Normal sinus rhythm (11/06 1900) Resp:  [14] 14 (11/06 1711) BP: (143-153)/(71-83) 153/83 (11/06 1711) SpO2:  [98 %-100 %] 100 % (11/06 1959) FiO2 (%):  [21 %] 21 % (11/06 2303) Weight:  [70.2 kg (154 lb 12.2 oz)] 70.2 kg (154 lb 12.2 oz) (11/07 0500)  Intake/Output Summary (Last 24 hours) at 05/16/2024 0753 Last data filed at 05/16/2024 0245 Gross per 24 hour  Intake 1083 ml  Output 2925 ml  Net -1842 ml    Current Heart Failure Medications:  Loop diuretic: furosemide  60 mg IV  BID Beta-Blocker: none ACEI/ARB/ARNI: losartan 50 mg daily MRA: spironolactone 25 mg daily SGLT2i: Farxiga 10 mg daily Other:  Prior to admission Heart Failure Medications:  Loop diuretic: furosemide  40 mg daily Beta-Blocker: metoprolol  succinate 25 mg daily ACEI/ARB/ARNI: lisinopril  5 mg daily MRA: none SGLT2i: none Other: none  Assessment: 1. Acute on chronic combined systolic and diastolic heart failure (LVEF 30-35%) with G1DD, due to ICM. NYHA class II-III symptoms.  -Symptoms: Patient reports feeling better. Able to walk the unit yesterday without issues. Trace LEE.  -Volume: Appears nearly euvolemic on exam. Urine is becoming darker yellow. Creatinine and BUN are slightly trending up and chloride trending down. -Hemodynamics: BP elevated. HR 70s. -BB: Consider transition to bisoprolol given COPD.  Likely fine to do today or tomorrow as patient appears to have volume controlled. -ACEI/ARB/ARNI: Transitioned to losartan with plans for [facility] tomorrow. Consider starting at 49-51 mg BID. -MRA: Agree with starting spironolactone 25 mg daily. -SGLT2i: Agree with starting Farxiga 10 mg daily  Plan: 1) Medication changes recommended at this time: - Agree with today's changes - Tomorrow, consider transition to oral diuretics if correlates clinically, consider transition from losartan to Entresto 49-51 mg BID, consider adding bisoprolol 2.5 mg daily  2) Patient assistance: -Entresto, Farxiga, and Jardiance are $0  3) Education: - Patient has been educated on current HF medications and potential additions to HF medication regimen - Patient verbalizes understanding that over the next few months, these medication doses may change and more medications may be added to optimize HF regimen - Patient has been educated on basic disease state pathophysiology and goals of therapy  Medication Assistance / Insurance Benefits Check: Does the patient have prescription insurance?    Type of insurance plan:  Does the patient qualify for medication assistance through manufacturers or grants? No Please do not hesitate to reach out with questions or concerns,  Jaun Bash, PharmD, CPP, BCPS, [facility] Heart Failure Pharmacist  Phone - [phone] 05/16/2024 8:58 AM

## 2024-05-16 NOTE — Progress Notes (Signed)
 Mobility Specialist - Progress Note    05/16/24 1600  Mobility  Activity Ambulated with assistance;Stood at bedside  Level of Assistance Independent after set-up  Assistive Device None  Distance Ambulated (ft) 30 ft  Range of Motion/Exercises All extremities  Activity Response Tolerated well  Mobility visit 1 Mobility  Mobility Specialist Start Time (ACUTE ONLY) 1558  Mobility Specialist Stop Time (ACUTE ONLY) 1618  Mobility Specialist Time Calculation (min) (ACUTE ONLY) 20 min   Pt was supine in bed with HOB elevated on RA and guest in the room upon entry. Pt agreed to mobility. Pt is able today to get to the EOB independently with no bed features. Pt is able today to STS independently with no AD. Pt ambulated well. During activity pt family entered the room. Pt had a BM in the bathroom. After BM pt returned to the bed with needs in reach. Nurse in the room upon exit.  Clem Rodes Mobility Specialist 05/16/24, 4:57 PM

## 2024-05-16 NOTE — Plan of Care (Signed)
  Problem: Education: Goal: Knowledge of General Education information will improve Description: Including pain rating scale, medication(s)/side effects and non-pharmacologic comfort measures Outcome: Progressing   Problem: Activity: Goal: Risk for activity intolerance will decrease Outcome: Progressing   Problem: Nutrition: Goal: Adequate nutrition will be maintained Outcome: Progressing   Problem: Elimination: Goal: Will not experience complications related to bowel motility Outcome: Progressing Goal: Will not experience complications related to urinary retention Outcome: Progressing   Problem: Pain Managment: Goal: General experience of comfort will improve and/or be controlled Outcome: Progressing   Problem: Skin Integrity: Goal: Risk for impaired skin integrity will decrease Outcome: Progressing

## 2024-05-16 NOTE — Progress Notes (Signed)
 Physical Therapy Treatment Patient Details Name: Blake [name] MRN: [id number] DOB: 1956/07/03 Today's Date: 05/16/2024   History of Present Illness Pt is a 68 y/o M presenting to ED with c/o worsening dyspnea. MD assessment includes acute respiratory failure with hypercapnia secondary to COPD and CHF exacerbation. PMH significant for HTN, HLD, CAD s/p stent placement, HFrEF, COPD.    PT Comments  Pt demonstrate Mod I with bed mobility and transfers and supervision with dynamic high level standing balance and longer gait distances all without AD.  During gait: pt had no LOB, pt donned shoes from home. No toe catching but decreased bil. toe clearance. SPO2 RA 91-92%, HR 77-84 bpm. Pt will benefit from continued PT services upon discharge to safely address dynamic high level standing balance and increase activity tolerance and decreased caregiver assistance and eventual return to PLOF.     If plan is discharge home, recommend the following: A little help with walking and/or transfers;A little help with bathing/dressing/bathroom;Assist for transportation;Help with stairs or ramp for entrance;Direct supervision/assist for medications management;Direct supervision/assist for financial management;Assistance with cooking/housework   Can travel by [employer] walker (2 wheels)    Recommendations for Other Services       Precautions / Restrictions Precautions Precautions: Fall Recall of Precautions/Restrictions: Impaired Restrictions Weight Bearing Restrictions Per Provider Order: No     Mobility  Bed Mobility Overal bed mobility: Independent Bed Mobility: Supine to Sit     Supine to sit: Independent          Transfers Overall transfer level: Independent Equipment used: None Transfers: Sit to/from Stand, Bed to chair/wheelchair/BSC Sit to Stand: Modified independent (Device/Increase time)           General transfer comment:  no LOB, no verbal or physical assist needed.    Ambulation/Gait Ambulation/Gait assistance: Supervision Gait Distance (Feet): 300 Feet Assistive device: None Gait Pattern/deviations: Narrow base of support, WFL(Within Functional Limits) Gait velocity: WNL     General Gait Details: no LOB, pt donned shoes from  home.  No toe catching but decreased bil. toe clearance.  SPO2 RA 91-92%, HR 77-84 bpm.   Stairs             Wheelchair Mobility     Tilt Bed    Modified Rankin (Stroke Patients Only)       Balance Overall balance assessment: Independent Sitting-balance support: No upper extremity supported, Feet unsupported Sitting balance-Leahy Scale: Normal Sitting balance - Comments: dynamic sitting balance   Standing balance support: No upper extremity supported Standing balance-Leahy Scale: Good Standing balance comment: dynamic standing  balance, no LOB, supervision level.                            Communication Communication Communication: Impaired Factors Affecting Communication: Reduced clarity of speech  Cognition Arousal: Alert Behavior During Therapy: Impulsive   PT - Cognitive impairments: Safety/Judgement   Orientation impairments: Time, Situation                   PT - Cognition Comments: pt listened to instructions for waiting with mobility but was ready to get up. Following commands: Impaired Following commands impaired: Only follows one step commands consistently    Cueing Cueing Techniques: Verbal cues  Exercises      General Comments        Pertinent Vitals/Pain Pain Assessment Pain Assessment: No/denies pain  Home Living                          Prior Function            PT Goals (current goals can now be found in the care plan section) Acute Rehab PT Goals Patient Stated Goal: to go home PT Goal Formulation: With patient Time For Goal Achievement: 05/29/24 Potential to Achieve Goals:  Fair Progress towards PT goals: Progressing toward goals    Frequency    Min 2X/week      PT Plan      Co-evaluation              AM-PAC PT 6 Clicks Mobility   Outcome Measure  Help needed turning from your back to your side while in a flat bed without using bedrails?: None Help needed moving from lying on your back to sitting on the side of a flat bed without using bedrails?: None Help needed moving to and from a bed to a chair (including a wheelchair)?: None Help needed standing up from a chair using your arms (e.g., wheelchair or bedside chair)?: None Help needed to walk in hospital room?: None Help needed climbing 3-5 steps with a railing? : A Little 6 Click Score: 23    End of Session   Activity Tolerance: Patient tolerated treatment well Patient left: in bed;with family/visitor present;with nursing/sitter in room Nurse Communication: Mobility status PT Visit Diagnosis: Unsteadiness on feet (R26.81);Muscle weakness (generalized) (M62.81);Other abnormalities of gait and mobility (R26.89)     Time: 8849-8795 PT Time Calculation (min) (ACUTE ONLY): 14 min  Charges:    $Gait Training: 8-22 mins PT General Charges $$ ACUTE PT VISIT: 1 Visit                     Harland Irving, PTA  05/16/24, 12:18 PM

## 2024-05-16 NOTE — Progress Notes (Signed)
 Progress Note   Patient: Blake [name] FMW:[id number] DOB: Sep 20, 1955 DOA: 05/14/2024     2 DOS: the patient was seen and examined on 05/16/2024   Brief hospital course: 68 y.o. year old male with past medical history of hypertension, hyperlipidemia, CAD status post stent placement, HFrEF (EF 35% in 05/2021), COPD presenting to the ED with worsening dyspnea.     Pt states over the last 3 to 4 days he has been developing dyspnea with exertion.  It has been worsening to where patient is not able to ambulate across the room without getting short of breath.  Prior to this he was able to ambulate without getting any shortness of breath.  He denies any fever or chills.  But did state he has a cough without any sputum production.  He does not report any sick contacts.     On arrival to the ED patient was noted to be HDS stable.  Lab work and imaging obtained.  CBC without leukocytosis, mild anemia.  CMP with elevated bicarb, but no AKI.  VBG with normal pH but elevated pCO2 at 73.  Respiratory panel negative for COVID flu and RSV.  BNP elevated at 950.  Troponin mildly elevated at 46, repeat pending.  Chest x-ray showing some signs of volume overload.  EDP gave patient Solu-Medrol , DuoNeb and placed him on BiPAP due to concern for tachypnea.  Given need for continued care TRH was consulted.  11/6.  Patient was agitated upon arrival to the floor.  When I saw him he was answering all questions appropriately.  Cardiology increase Lasix  to 60 mg IV twice daily. 11/7.  Patient states he is breathing better.  Still has a little bit of a wheeze.  Will switch losartan over to [facility] tomorrow.   Assessment and Plan: * Acute respiratory failure with hypoxia and hypercapnia (HCC) Patient required BiPAP on presentation.  Patient had 1 pulse ox of 86% on room air.  pCO2 on presentation 73.  Now breathing on room air.  Since patient improved pretty quickly and more likely CHF but still likely has COPD  component.  Acute on chronic systolic (congestive) heart failure (HCC) EF 30 to 35%.  Continue IV Lasix  60 mg twice a day.  Convert Cozaar over to Entresto for tomorrow.  Continue spironolactone.  Started Farxiga.  Hold beta-blocker for right now.  COPD with acute exacerbation (HCC) Patient given Solu-Medrol  and switched over to prednisone .  Continue nebulizer treatments.  P.o. Zithromax.  Memory loss On Exelon   Acute metabolic encephalopathy Patient required BiPAP on presentation.  1 pulse ox of 86% on room air.  This has improved by the time I saw him.  Could be secondary to being in the ER overnight, high pCO2 or steroids.  Mental status improved.  Hyperlipidemia, unspecified On Lipitor  Tobacco abuse Nicotine  patch.  Patient stated he stopped smoking when he was admitted.        Subjective: Patient feeling better.  States he is breathing better.  Admitted with CHF and COPD exacerbation.  Physical Exam: Vitals:   05/15/24 1959 05/16/24 0500 05/16/24 0806 05/16/24 1147  BP:   (!) 150/67 (!) 149/77  Pulse:   71 77  Resp:   (!) 21 (!) 21  Temp:   97.9 F (36.6 C) 98.3 F (36.8 C)  TempSrc:    Oral  SpO2: 100%  90% 93%  Weight:  70.2 kg    Height:       Physical Exam HENT:  Head: Normocephalic.     Mouth/Throat:     Pharynx: No oropharyngeal exudate.  Eyes:     General: Lids are normal.     Conjunctiva/sclera: Conjunctivae normal.  Cardiovascular:     Rate and Rhythm: Normal rate and regular rhythm.     Heart sounds: Normal heart sounds, S1 normal and S2 normal.  Pulmonary:     Breath sounds: Examination of the right-middle field reveals wheezing. Examination of the left-middle field reveals wheezing. Examination of the right-lower field reveals decreased breath sounds and wheezing. Examination of the left-lower field reveals decreased breath sounds and wheezing. Decreased breath sounds and wheezing present. No rhonchi or rales.  Abdominal:     Palpations:  Abdomen is soft.     Tenderness: There is no abdominal tenderness.  Musculoskeletal:     Right lower leg: Swelling present.     Left lower leg: Swelling present.  Skin:    General: Skin is warm.     Findings: No rash.  Neurological:     Mental Status: He is alert.     Data Reviewed: Sodium 141, potassium 3.6, creatinine 1.1, white blood cell count 9.6, hemoglobin 12.7, platelet count 225  Family Communication: Spoke with girlfriend at the bedside and sister on the phone  Disposition: Status is: Inpatient Remains inpatient appropriate because: Continue IV diuresis today, will switch over to [facility] tomorrow.  Planned Discharge Destination: Home with Home Health    Time spent: 28 minutes  Author: Charlie Patterson, MD 05/16/2024 2:58 PM  For on call review [email or web address].

## 2024-05-17 DIAGNOSIS — R413 Other amnesia: Secondary | ICD-10-CM

## 2024-05-17 DIAGNOSIS — J9601 Acute respiratory failure with hypoxia: Secondary | ICD-10-CM | POA: Diagnosis not present

## 2024-05-17 DIAGNOSIS — I5023 Acute on chronic systolic (congestive) heart failure: Secondary | ICD-10-CM | POA: Diagnosis not present

## 2024-05-17 DIAGNOSIS — J441 Chronic obstructive pulmonary disease with (acute) exacerbation: Secondary | ICD-10-CM | POA: Diagnosis not present

## 2024-05-17 LAB — BASIC METABOLIC PANEL WITH GFR
Anion gap: 11 (ref 5–15)
BUN: 30 mg/dL — ABNORMAL HIGH (ref 8–23)
CO2: 35 mmol/L — ABNORMAL HIGH (ref 22–32)
Calcium: 8.9 mg/dL (ref 8.9–10.3)
Chloride: 92 mmol/L — ABNORMAL LOW (ref 98–111)
Creatinine, Ser: 1.12 mg/dL (ref 0.61–1.24)
GFR, Estimated: >60 mL/min (ref >60)
Glucose, Bld: 99 mg/dL (ref 70–99)
Potassium: 4 mmol/L (ref 3.5–5.1)
Sodium: 138 mmol/L (ref 135–145)

## 2024-05-17 LAB — GLUCOSE, CAPILLARY
Glucose-Capillary: 176 mg/dL — ABNORMAL HIGH (ref 70–99)
Glucose-Capillary: 167 mg/dL — ABNORMAL HIGH (ref 70–99)

## 2024-05-17 MED ORDER — FUROSEMIDE 40 MG PO TABS
40.0000 mg | ORAL_TABLET | Freq: Two times a day (BID) | ORAL | Status: DC
  Administered 2024-05-18: 40 mg via ORAL
  Filled 2024-05-17: qty 1

## 2024-05-17 MED ORDER — SACUBITRIL-VALSARTAN 24-26 MG PO TABS
1.0000 | ORAL_TABLET | Freq: Two times a day (BID) | ORAL | Status: DC
  Administered 2024-05-17 – 2024-05-18 (×2): 1 via ORAL
  Filled 2024-05-17 (×2): qty 1

## 2024-05-17 MED ORDER — FUROSEMIDE 40 MG PO TABS: 40.0000 mg | ORAL_TABLET | Freq: Two times a day (BID) | ORAL | Status: DC

## 2024-05-17 NOTE — Progress Notes (Signed)
 [facility] Cardiology    SUBJECTIVE: Patient states he feels reasonably well his breathing is much improved no pain no wheezing he is interested in going home as soon as possible   Vitals:   05/17/24 0003 05/17/24 0350 05/17/24 0351 05/17/24 0804  BP: (!) 122/55 (!) 144/68  (!) 159/80  Pulse: 67 76  72  Resp: 20 17    Temp: 98.3 F (36.8 C) 98.7 F (37.1 C)  98 F (36.7 C)  TempSrc:  Oral    SpO2: 100% 92%  (!) 86%  Weight:   65.7 kg   Height:         Intake/Output Summary (Last 24 hours) at 05/17/2024 9096 Last data filed at 05/17/2024 0351 Gross per 24 hour  Intake 960 ml  Output 2500 ml  Net -1540 ml      PHYSICAL EXAM  General: Well developed, well nourished, in no acute distress HEENT:  Normocephalic and atramatic Neck:  No JVD.  Lungs: Clear bilaterally to auscultation and percussion. Heart: HRRR . Normal S1 and S2 without gallops or murmurs.  Abdomen: Bowel sounds are positive, abdomen soft and non-tender  Msk:  Back normal, normal gait. Normal strength and tone for age. Extremities: No clubbing, cyanosis or edema.   Neuro: Alert and oriented X 3. Psych:  Good affect, responds appropriately   LABS: Basic Metabolic Panel: Recent Labs    05/14/24 1743 05/15/24 0343 05/16/24 0345 05/17/24 0531  NA  --    < > 141 138  K  --    < > 3.6 4.0  CL  --    < > 95* 92*  CO2  --    < > 36* 35*  GLUCOSE  --    < > 80 99  BUN  --    < > 31* 30*  CREATININE  --    < > 1.10 1.12  CALCIUM   --    < > 9.0 8.9  MG 1.8  --   --   --    < > = values in this interval not displayed.   Liver Function Tests: Recent Labs    05/14/24 1541  AST 33  ALT 18  ALKPHOS 93  BILITOT 0.6  PROT 8.0  ALBUMIN 3.0*   No results for input(s): LIPASE, AMYLASE in the last 72 hours. CBC: Recent Labs    05/14/24 1541 05/15/24 0343 05/16/24 0345  WBC 7.1 4.3 9.6  NEUTROABS 5.1  --   --   HGB 12.6* 11.9* 12.7*  HCT 41.0 38.9* 40.4  MCV 99.0 99.2 98.1  PLT 210 203 225    Cardiac Enzymes: No results for input(s): CKTOTAL, CKMB, CKMBINDEX, TROPONINI in the last 72 hours. BNP: Invalid input(s): POCBNP D-Dimer: No results for input(s): DDIMER in the last 72 hours. Hemoglobin A1C: No results for input(s): HGBA1C in the last 72 hours. Fasting Lipid Panel: No results for input(s): CHOL, HDL, LDLCALC, TRIG, CHOLHDL, LDLDIRECT in the last 72 hours. Thyroid  Function Tests: No results for input(s): TSH, T4TOTAL, T3FREE, THYROIDAB in the last 72 hours.  Invalid input(s): FREET3 Anemia Panel: No results for input(s): VITAMINB12, FOLATE, FERRITIN, TIBC, IRON, RETICCTPCT in the last 72 hours.  ECHOCARDIOGRAM COMPLETE Result Date: 05/15/2024    ECHOCARDIOGRAM REPORT   Patient Name:   Blake [name] Date of Exam: 05/15/2024 Medical Rec #:  [id number]         Height:       66.0 in Accession #:    [id number]  Weight:       156.3 lb Date of Birth:  1955-11-26        BSA:          1.801 m Patient Age:    68 years          BP:           142/70 mmHg Patient Gender: M                 HR:           63 bpm. Exam Location:  [facility] Procedure: 2D Echo, Cardiac Doppler and Color Doppler (Both Spectral and Color            Flow Doppler were utilized during procedure). Indications:     CHF-acute systolic I50.21  History:         Patient has prior history of Echocardiogram examinations, most                  recent 06/08/2019. CHF, Previous Myocardial Infarction, COPD;                  Risk Factors:Hypertension. Tobacco use.  Sonographer:     Christopher Furnace Referring Phys:  [id number] [facility] Diagnosing Phys: Evalene Lunger MD IMPRESSIONS  1. Left ventricular ejection fraction, by estimation, is 30 to 35%. Left ventricular ejection fraction by PLAX is 35 %. The left ventricle has moderately decreased function. The left ventricle demonstrates regional wall motion abnormalities (global hypokinesis with severe hypokinesis of the anterior,  anterospetal and lateral wall ). Left ventricular diastolic parameters are consistent with Grade I diastolic dysfunction (impaired relaxation).  2. Right ventricular systolic function is mildly reduced. The right ventricular size is normal. There is moderately elevated pulmonary artery systolic pressure. The estimated right ventricular systolic pressure is 53.6 mmHg.  3. Large pleural effusion in the left lateral region.  4. The mitral valve is normal in structure. Moderate mitral valve regurgitation. No evidence of mitral stenosis.  5. Tricuspid valve regurgitation is moderate.  6. The aortic valve is tricuspid. Aortic valve regurgitation is mild. Aortic valve sclerosis is present, with no evidence of aortic valve stenosis.  7. The inferior vena cava is dilated in size with >50% respiratory variability, suggesting right atrial pressure of 8 mmHg. FINDINGS  Left Ventricle: Left ventricular ejection fraction, by estimation, is 30 to 35%. Left ventricular ejection fraction by PLAX is 35 %. The left ventricle has moderately decreased function. The left ventricle demonstrates regional wall motion abnormalities. Strain was performed and the global longitudinal strain is indeterminate. The left ventricular internal cavity size was normal in size. There is no left ventricular hypertrophy. Left ventricular diastolic parameters are consistent with Grade I diastolic dysfunction (impaired relaxation). Right Ventricle: The right ventricular size is normal. No increase in right ventricular wall thickness. Right ventricular systolic function is mildly reduced. There is moderately elevated pulmonary artery systolic pressure. The tricuspid regurgitant velocity is 3.30 m/s, and with an assumed right atrial pressure of 10 mmHg, the estimated right ventricular systolic pressure is 53.6 mmHg. Left Atrium: Left atrial size was normal in size. Right Atrium: Right atrial size was normal in size. Pericardium: There is no evidence of  pericardial effusion. Mitral Valve: The mitral valve is normal in structure. Moderate mitral valve regurgitation. No evidence of mitral valve stenosis. Tricuspid Valve: The tricuspid valve is normal in structure. Tricuspid valve regurgitation is moderate . No evidence of tricuspid stenosis. Aortic Valve: The aortic valve is tricuspid. Aortic  valve regurgitation is mild. Aortic regurgitation PHT measures 311 msec. Aortic valve sclerosis is present, with no evidence of aortic valve stenosis. Aortic valve mean gradient measures 2.0 mmHg. Aortic valve peak gradient measures 4.2 mmHg. Aortic valve area, by VTI measures 4.21 cm. Pulmonic Valve: The pulmonic valve was normal in structure. Pulmonic valve regurgitation is mild. No evidence of pulmonic stenosis. Aorta: The aortic root is normal in size and structure. Venous: The inferior vena cava is dilated in size with greater than 50% respiratory variability, suggesting right atrial pressure of 8 mmHg. IAS/Shunts: No atrial level shunt detected by color flow Doppler. Additional Comments: 3D was performed not requiring image post processing on an independent workstation and was indeterminate. There is a large pleural effusion in the left lateral region.  LEFT VENTRICLE PLAX 2D LV EF:         Left            Diastology                ventricular     LV e' medial:    5.03 cm/s                ejection        LV E/e' medial:  21.1                fraction by     LV e' lateral:   6.09 cm/s                PLAX is 35      LV E/e' lateral: 17.4                %. LVIDd:         5.48 cm LVIDs:         4.54 cm LV PW:         1.33 cm LV IVS:        0.91 cm LVOT diam:     2.00 cm LV SV:         73 LV SV Index:   41 LVOT Area:     3.14 cm LV IVRT:       81 msec  LV Volumes (MOD) LV vol d, MOD    108.0 ml A2C: LV vol d, MOD    201.0 ml A4C: LV vol s, MOD    124.0 ml A4C: LV SV MOD A4C:   201.0 ml RIGHT VENTRICLE RV Basal diam:  4.25 cm RV Mid diam:    3.47 cm LEFT ATRIUM           Index         RIGHT ATRIUM           Index LA diam:      3.40 cm 1.89 cm/m   RA Area:     18.70 cm LA Vol (A4C): 57.1 ml 31.71 ml/m  RA Volume:   55.90 ml  31.04 ml/m  AORTIC VALVE                    PULMONIC VALVE AV Area (Vmax):    3.36 cm     PR End Diast Vel: 17.47 msec AV Area (Vmean):   2.98 cm AV Area (VTI):     4.21 cm AV Vmax:           102.00 cm/s AV Vmean:          65.700 cm/s AV VTI:  0.174 m AV Peak Grad:      4.2 mmHg AV Mean Grad:      2.0 mmHg LVOT Vmax:         109.00 cm/s LVOT Vmean:        62.300 cm/s LVOT VTI:          0.233 m LVOT/AV VTI ratio: 1.34 AI PHT:            311 msec  AORTA Ao Root diam: 3.30 cm MITRAL VALVE                TRICUSPID VALVE MV Area (PHT): 3.24 cm     TR Peak grad:   43.6 mmHg MV Decel Time: 234 msec     TR Vmax:        330.00 cm/s MV E velocity: 106.00 cm/s MV A velocity: 42.70 cm/s   SHUNTS MV E/A ratio:  2.48         Systemic VTI:  0.23 m                             Systemic Diam: 2.00 cm Evalene Lunger MD Electronically signed by Evalene Lunger MD Signature Date/Time: 05/15/2024/1:08:30 PM    Final      Echo moderate reduced left ventricular function EF around 30 to 35% %  TELEMETRY: Sinus rhythm bundle branch block rate of 80 nonspecific ST-T wave changes:  ASSESSMENT AND PLAN:  Principal Problem:   Acute respiratory failure with hypoxia and hypercapnia (HCC) Active Problems:   S/P CABG (coronary artery bypass graft)   Tobacco abuse   Benign prostatic hyperplasia with urinary obstruction   Hyperlipidemia, unspecified   Acute on chronic systolic (congestive) heart failure (HCC)   COPD with acute exacerbation (HCC)   Acute metabolic encephalopathy   Memory loss    Plan Acute on chronic hypoxic hypercapnic respiratory failure inhalers supplemental oxygen steroid taper antibiotics follow-up with pulmonary Moderate to severe COPD continue aggressive pulmonary support with wheezing need for inhalers avoidance and tobacco abuse Acute on  chronic systolic congestive heart failure GDMT for systolic congestive heart failure continue Farxiga Entresto spironolactone consider adding beta-blocker in the next few days continue  diuretics p.o. Acute metabolic encephalopathy possibly related to hypercapnia and hypoxia patient somewhat improved Hyperlipidemia high-dose high intensity statin therapy for hyperlipidemia Smoking provide patient refrain from tobacco abuse Multivessel coronary artery disease no evidence of ACS continue coronary disease management aspirin  statin Mild cognitive impairment continue current therapy consider follow-up with neurology or psychiatry Increase activity physical therapy Occupational Therapy to help with ambulation balance and strength training No invasive cardiac procedures recommended at this stage   Cara JONETTA Lovelace, MD, 05/17/2024 9:03 AM

## 2024-05-17 NOTE — TOC Progression Note (Addendum)
 Transition of Care [facility]) - Progression Note    Patient Details  Name: Blake [name] MRN: [id number] Date of Birth: [birth year]  Transition of Care [facility]) CM/SW Contact  Victory Jackquline RAMAN, RN Phone Number: 05/17/2024, 9:39 AM  Clinical Narrative:   RNCM received a message via secure chat from the MD stating that the patient needs a hospital bed and that he had entered in the DME order. Email sent to [employer] for DME [facility] Bed). RNCM will continue to follow for discharge planning needs.  10:30 am: RNCM met with patient and his girlfriend Justine at the bedside. RNCM introduced role and explained that discharge planning would be discussed. PT is recommending HH/PT and the MD was ordering a hospital bed for him. I asked him if he was currently working with and agency and he said no and that he wanted me to reach out to his sister to handle the agency choice. Email sent to [employer] for a hospital bed.  1:11 pm: RNCM spoke to patients sister [name] @ [phone]. I introduced role and explained that discharge planning would be discussed. PT is recommending HH/PT for her brother and the MD was ordering a hospital bed for him. She was in agreement and asked if the MD would be able to also order an Aide to come out to assist her brother with personal hygiene. MD made aware and said he would order it. Sister wanted to see if [facility] could come out. I wasn't able to pull up their information so I asked her to call or find the number and call me back with their number. I informed her that I would submit him to other [facility] Agencies in the area in the mean time and let her know who accepts his referral. She was in agreement.   Expected Discharge Plan: Group Home Barriers to Discharge: Continued Medical Work up               Expected Discharge Plan and Services     Post Acute Care Choice: NA Living arrangements for the past 2 months: Group Home                                        Social Drivers of Health (SDOH) Interventions SDOH Screenings   Food Insecurity: Food Insecurity Present (05/15/2024)  Housing: Low Risk  (05/15/2024)  Transportation Needs: No Transportation Needs (05/15/2024)  Utilities: Not At Risk (05/15/2024)  Alcohol Screen: Low Risk  (03/20/2023)  Depression (PHQ2-9): Low Risk  (12/19/2023)  Financial Resource Strain: Low Risk  (03/20/2023)  Physical Activity: Inactive (03/20/2023)  Social Connections: Socially Isolated (05/15/2024)  Stress: No Stress Concern Present (03/20/2023)  Tobacco Use: High Risk (05/14/2024)  Health Literacy: Inadequate Health Literacy (03/20/2023)    Readmission Risk Interventions     No data to display

## 2024-05-17 NOTE — Plan of Care (Signed)

## 2024-05-17 NOTE — TOC CM/SW Note (Signed)
 Hospital Bed     A)The pt has a medical condition which requires positioning of body in ways not feasible with an ordinary bed     B) The pt has a medical condition which requires positioning of body in ways not feasible with an ordinary bed in order to alleviate pain   C)The pt requires frequent repositioning of the body and the head to be at 30 degrees due to Congestive Heart Failure. He can't lay flat. This is not attainable by an ordinary bed they will need a hospital bed .

## 2024-05-17 NOTE — Progress Notes (Signed)
 Progress Note   Patient: Blake [name] FMW:[id number] DOB: 07-07-1956 DOA: 05/14/2024     3 DOS: the patient was seen and examined on 05/17/2024   Brief hospital course: 68 y.o. year old male with past medical history of hypertension, hyperlipidemia, CAD status post stent placement, HFrEF (EF 35% in 05/2021), COPD presenting to the ED with worsening dyspnea.     Pt states over the last 3 to 4 days he has been developing dyspnea with exertion.  It has been worsening to where patient is not able to ambulate across the room without getting short of breath.  Prior to this he was able to ambulate without getting any shortness of breath.  He denies any fever or chills.  But did state he has a cough without any sputum production.  He does not report any sick contacts.     On arrival to the ED patient was noted to be HDS stable.  Lab work and imaging obtained.  CBC without leukocytosis, mild anemia.  CMP with elevated bicarb, but no AKI.  VBG with normal pH but elevated pCO2 at 73.  Respiratory panel negative for COVID flu and RSV.  BNP elevated at 950.  Troponin mildly elevated at 46, repeat pending.  Chest x-ray showing some signs of volume overload.  EDP gave patient Solu-Medrol , DuoNeb and placed him on BiPAP due to concern for tachypnea.  Given need for continued care [facility] was consulted.  11/6.  Patient was agitated upon arrival to the floor.  When I saw him he was answering all questions appropriately.  Cardiology increase Lasix  to 60 mg IV twice daily. 11/7.  Patient states he is breathing better.  Still has a little bit of a wheeze.  Will switch losartan over to [facility] tomorrow. 11/8.  Blood pressure low after starting Entresto today.  Will cut back dose from this evening.  Change Lasix  over to oral for tomorrow.   Assessment and Plan: * Acute respiratory failure with hypoxia and hypercapnia (HCC) Patient required BiPAP on presentation.  Patient had 1 pulse ox of 86% on room air.  pCO2 on  presentation 73.  Now breathing on room air.  Since patient improved pretty quickly and more likely CHF but still likely has COPD component.  Off oxygen.  Acute on chronic systolic (congestive) heart failure (HCC) EF 30 to 35%.  Change Lasix  over to oral for tomorrow.  Decrease dose of Entresto for this evening since blood pressure low currently.  Continue spironolactone and Farxiga.  Hold beta-blocker for right now.  COPD with acute exacerbation (HCC) Patient given Solu-Medrol  and switched over to prednisone .  Continue nebulizer treatments.  P.o. Zithromax.  Memory loss On Exelon   Acute metabolic encephalopathy Patient required BiPAP on presentation.  1 pulse ox of 86% on room air.  This has improved by the time I saw him.  Could be secondary to being in the ER overnight, high pCO2 or steroids.  Mental status improved.  Hyperlipidemia, unspecified On Lipitor  Tobacco abuse Nicotine  patch.  Patient stated he stopped smoking when he was admitted.        Subjective: Patient states he is breathing better.  Switching over to Entresto today.  Blood pressure little low after Entresto dose will hold further Lasix  dosing today and cut back Entresto dose for tomorrow.  Admitted with CHF and COPD exacerbation  Physical Exam: Vitals:   05/17/24 0350 05/17/24 0351 05/17/24 0804 05/17/24 1205  BP: (!) 144/68  (!) 159/80 92/64  Pulse: 76  72 90  Resp: 17     Temp: 98.7 F (37.1 C)  98 F (36.7 C) 97.6 F (36.4 C)  TempSrc: Oral  Oral   SpO2: 92%  (!) 86% 95%  Weight:  65.7 kg    Height:       Physical Exam HENT:     Head: Normocephalic.     Mouth/Throat:     Pharynx: No oropharyngeal exudate.  Eyes:     General: Lids are normal.     Conjunctiva/sclera: Conjunctivae normal.  Cardiovascular:     Rate and Rhythm: Normal rate and regular rhythm.     Heart sounds: Normal heart sounds, S1 normal and S2 normal.  Pulmonary:     Breath sounds: Examination of the right-lower field  reveals decreased breath sounds and wheezing. Examination of the left-lower field reveals decreased breath sounds and wheezing. Decreased breath sounds and wheezing present. No rhonchi or rales.  Abdominal:     Palpations: Abdomen is soft.     Tenderness: There is no abdominal tenderness.  Musculoskeletal:     Right lower leg: Swelling present.     Left lower leg: Swelling present.  Skin:    General: Skin is warm.     Findings: No rash.  Neurological:     Mental Status: He is alert.     Data Reviewed: Creatinine 1.12, CO2 35  Family Communication: Spoke with sister on the phone and girlfriend at bedside  Disposition: Status is: Inpatient Remains inpatient appropriate because: Will evaluate tomorrow for discharge.  Blood pressure little low after Entresto dose today will cut back dose for this evening.  Change Lasix  over to oral for tomorrow.  Planned Discharge Destination: Home    Time spent: 28 minutes  Author: Charlie Patterson, MD 05/17/2024 12:59 PM  For on call review [email or web address].

## 2024-05-18 DIAGNOSIS — R413 Other amnesia: Secondary | ICD-10-CM | POA: Diagnosis not present

## 2024-05-18 DIAGNOSIS — J441 Chronic obstructive pulmonary disease with (acute) exacerbation: Secondary | ICD-10-CM | POA: Diagnosis not present

## 2024-05-18 DIAGNOSIS — I5023 Acute on chronic systolic (congestive) heart failure: Secondary | ICD-10-CM | POA: Diagnosis not present

## 2024-05-18 DIAGNOSIS — J9601 Acute respiratory failure with hypoxia: Secondary | ICD-10-CM | POA: Diagnosis not present

## 2024-05-18 LAB — BASIC METABOLIC PANEL WITH GFR
Anion gap: 10 (ref 5–15)
BUN: 36 mg/dL — ABNORMAL HIGH (ref 8–23)
CO2: 35 mmol/L — ABNORMAL HIGH (ref 22–32)
Calcium: 8.8 mg/dL — ABNORMAL LOW (ref 8.9–10.3)
Chloride: 95 mmol/L — ABNORMAL LOW (ref 98–111)
Creatinine, Ser: 1.21 mg/dL (ref 0.61–1.24)
GFR, Estimated: >60 mL/min (ref >60)
Glucose, Bld: 109 mg/dL — ABNORMAL HIGH (ref 70–99)
Potassium: 3.8 mmol/L (ref 3.5–5.1)
Sodium: 140 mmol/L (ref 135–145)

## 2024-05-18 LAB — GLUCOSE, CAPILLARY: Glucose-Capillary: 153 mg/dL — ABNORMAL HIGH (ref 70–99)

## 2024-05-18 MED ORDER — AZITHROMYCIN 250 MG PO TABS: ORAL_TABLET | ORAL | 0 refills | Status: DC

## 2024-05-18 MED ORDER — METOPROLOL SUCCINATE ER 25 MG PO TB24: 12.5000 mg | ORAL_TABLET | Freq: Every day | ORAL | 0 refills | Status: DC

## 2024-05-18 MED ORDER — DAPAGLIFLOZIN PROPANEDIOL 10 MG PO TABS: 10.0000 mg | ORAL_TABLET | Freq: Every day | ORAL | 0 refills | Status: DC

## 2024-05-18 MED ORDER — NICOTINE 14 MG/24HR TD PT24: MEDICATED_PATCH | TRANSDERMAL | 0 refills | Status: AC

## 2024-05-18 MED ORDER — PREDNISONE 20 MG PO TABS: 40.0000 mg | ORAL_TABLET | Freq: Every day | ORAL | 0 refills | Status: AC

## 2024-05-18 MED ORDER — SACUBITRIL-VALSARTAN 24-26 MG PO TABS: 1.0000 | ORAL_TABLET | Freq: Two times a day (BID) | ORAL | 0 refills | Status: DC

## 2024-05-18 MED ORDER — FUROSEMIDE 40 MG PO TABS: 40.0000 mg | ORAL_TABLET | Freq: Every day | ORAL | 0 refills | Status: DC

## 2024-05-18 MED ORDER — SPIRONOLACTONE 25 MG PO TABS: 25.0000 mg | ORAL_TABLET | Freq: Every day | ORAL | 0 refills | Status: DC

## 2024-05-18 NOTE — Discharge Summary (Signed)
 Physician Discharge Summary   Patient: Blake [name] MRN: [id number] DOB: 05-21-1956  Admit date:     05/14/2024  Discharge date: 05/18/24  Discharge Physician: Blake [name]   PCP: Blake Melanie DASEN, NP   Recommendations at discharge:   Follow-up PCP 5 days Follow-up cardiology Follow-up CHF clinic  Discharge Diagnoses: Principal Problem:   Acute respiratory failure with hypoxia and hypercapnia (HCC) Active Problems:   Acute on chronic systolic (congestive) heart failure (HCC)   COPD with acute exacerbation (HCC)   S/P CABG (coronary artery bypass graft)   Tobacco abuse   Benign prostatic hyperplasia with urinary obstruction   Hyperlipidemia, unspecified   Acute metabolic encephalopathy   Memory loss  Resolved Problems:   * No resolved hospital problems. *  Hospital Course: 68 y.o. year old male with past medical history of hypertension, hyperlipidemia, CAD status post stent placement, HFrEF (EF 35% in 05/2021), COPD presenting to the ED with worsening dyspnea.     Pt states over the last 3 to 4 days he has been developing dyspnea with exertion.  It has been worsening to where patient is not able to ambulate across the room without getting short of breath.  Prior to this he was able to ambulate without getting any shortness of breath.  He denies any fever or chills.  But did state he has a cough without any sputum production.  He does not report any sick contacts.     On arrival to the ED patient was noted to be HDS stable.  Lab work and imaging obtained.  CBC without leukocytosis, mild anemia.  CMP with elevated bicarb, but no AKI.  VBG with normal pH but elevated pCO2 at 73.  Respiratory panel negative for COVID flu and RSV.  BNP elevated at 950.  Troponin mildly elevated at 46, repeat pending.  Chest x-ray showing some signs of volume overload.  EDP gave patient Solu-Medrol , DuoNeb and placed him on BiPAP due to concern for tachypnea.  Given need for continued care TRH  was consulted.  11/6.  Patient was agitated upon arrival to the floor.  When I saw him he was answering all questions appropriately.  Cardiology increase Lasix  to 60 mg IV twice daily. 11/7.  Patient states he is breathing better.  Still has a little bit of a wheeze.  Will switch losartan over to [name] tomorrow. 11/8.  Blood pressure low after starting Entresto today.  Will cut back dose from this evening.  Change Lasix  over to oral for tomorrow. 11/9.  Patient feeling well wanting to go home.   Assessment and Plan: * Acute respiratory failure with hypoxia and hypercapnia (HCC) Patient required BiPAP on presentation.  Patient had 1 pulse ox of 86% on room air.  pCO2 on presentation 73.  Now breathing on room air.  Since patient improved pretty quickly and more likely CHF but still likely has COPD component.  Off oxygen quickly.  This problem improved.  Acute on chronic systolic (congestive) heart failure (HCC) EF 30 to 35%.  Discharge meds include Lasix  40 mg daily, Entresto 24-26 mg twice a day, Farxiga 10 mg daily, spironolactone 25 mg daily, Toprol -XL started as an outpatient 12.5 mg daily.  During hospital course patient was diuresed with Lasix  60 mg IV twice daily and switched over to oral Lasix  on 11/9.  COPD with acute exacerbation (HCC) Patient given Solu-Medrol  and switched over to prednisone .    P.o. Zithromax.  Memory loss On Exelon   Acute metabolic encephalopathy Patient required  BiPAP on presentation.  1 pulse ox of 86% on room air.  This has improved by the time I saw him.  Could be secondary to being in the ER overnight, high pCO2 or steroids.  Mental status improved.  Hyperlipidemia, unspecified On Lipitor  Tobacco abuse Nicotine  patch.  Patient stated he stopped smoking when he was admitted.         Consultants: Cardiology Procedures performed: None Disposition: Home health Diet recommendation:  Cardiac diet DISCHARGE MEDICATION: Allergies as of 05/18/2024    No Known Allergies      Medication List     STOP taking these medications    lisinopril  5 MG tablet Commonly known as: ZESTRIL        TAKE these medications    albuterol  108 (90 Base) MCG/ACT inhaler Commonly known as: VENTOLIN  HFA Inhale 2 puffs into the lungs every 6 (six) hours as needed.   Aspirin  Low Dose 81 MG chewable tablet Generic drug: aspirin  Chew 1 tablet (81 mg total) by mouth daily.   atorvastatin  40 MG tablet Commonly known as: LIPITOR Take 1 tablet (40 mg total) by mouth daily.   azithromycin 250 MG tablet Commonly known as: ZITHROMAX One tab po daily for two days Start taking on: May 19, 2024   B-12 1000 MCG Tabs Take 1 tablet (1,000 mcg total) by mouth daily.   dapagliflozin propanediol 10 MG Tabs tablet Commonly known as: FARXIGA Take 1 tablet (10 mg total) by mouth daily.   furosemide  40 MG tablet Commonly known as: LASIX  Take 1 tablet (40 mg total) by mouth daily. What changed: Another medication with the same name was removed. Continue taking this medication, and follow the directions you see here.   metoprolol  succinate 25 MG 24 hr tablet Commonly known as: TOPROL -XL Take 0.5 tablets (12.5 mg total) by mouth daily. What changed: how much to take   nicotine  14 mg/24hr patch Commonly known as: NICODERM CQ  - dosed in mg/24 hours One patch 14mg  chest wall daily (okay to substitute generic)   nitroGLYCERIN  0.4 MG SL tablet Commonly known as: NITROSTAT  Place 1 tablet (0.4 mg total) under the tongue every 5 (five) minutes as needed for chest pain. Reported on 09/29/2015   omeprazole  20 MG capsule Commonly known as: PRILOSEC Take 1 capsule (20 mg total) by mouth daily.   predniSONE  20 MG tablet Commonly known as: DELTASONE  Take 2 tablets (40 mg total) by mouth daily with breakfast for 1 day. Start taking on: May 19, 2024   rivastigmine  3 MG capsule Commonly known as: EXELON  Take 1 capsule (3 mg total) by mouth 2 (two)  times daily.   sacubitril-valsartan 24-26 MG Commonly known as: ENTRESTO Take 1 tablet by mouth 2 (two) times daily.   spironolactone 25 MG tablet Commonly known as: ALDACTONE Take 1 tablet (25 mg total) by mouth daily.   traZODone  50 MG tablet Commonly known as: DESYREL  Take 0.5-1 tablets (25-50 mg total) by mouth at bedtime as needed for sleep.   umeclidinium-vilanterol 62.5-25 MCG/ACT Aepb Commonly known as: Anoro Ellipta  Inhale 1 puff into the lungs daily at 6 (six) AM.   Vitamin D3 1.25 MG (50000 UT) Caps Take 1 capsule (50,000 Units total) by mouth every 7 (seven) days.               Durable Medical Equipment  (From admission, onward)           Start     Ordered   05/18/24 0805  For home use  only DME Bedside commode  Once       Question:  Patient needs a bedside commode to treat with the following condition  Answer:  Generalized weakness   05/18/24 0804   05/17/24 0857  For home use only DME Hospital bed  Once       Question Answer Comment  Length of Need Lifetime   Patient has (list medical condition): chf and unable to lie flat   The above medical condition requires: Patient requires the ability to reposition frequently   Head must be elevated greater than: 45 degrees   Bed type Semi-electric   Support Surface: Gel Overlay      05/17/24 0856            Follow-up Information     [facility]. Go on 05/27/2024.   Specialty: Cardiology Why: Hospital Follow-UP 05/27/2024 @ 9:30 [facility], [address], [address] Floor Please bring all medications to follow-up appointment Free Valet Parking at the door. Contact information: [address] [place]  [address] [phone]        Launie Maiden, Ronal Maxwell, NP. Go in 1 week(s).   Specialty: Nurse Practitioner Contact information: [address] [place] [address] [phone]         Blake Moris  T, NP Follow up in 5 day(s).   Specialty: Nurse Practitioner Contact information: [address] [place] [address] [phone]                Discharge Exam: Filed Weights   05/16/24 0500 05/17/24 0351 05/18/24 0500  Weight: 70.2 kg 65.7 kg 68.9 kg   Physical Exam HENT:     Head: Normocephalic.     Mouth/Throat:     Pharynx: No oropharyngeal exudate.  Eyes:     General: Lids are normal.     Conjunctiva/sclera: Conjunctivae normal.  Cardiovascular:     Rate and Rhythm: Normal rate and regular rhythm.     Heart sounds: Normal heart sounds, S1 normal and S2 normal.  Pulmonary:     Breath sounds: Examination of the right-lower field reveals decreased breath sounds. Examination of the left-lower field reveals decreased breath sounds. Decreased breath sounds present. No wheezing, rhonchi or rales.  Abdominal:     Palpations: Abdomen is soft.     Tenderness: There is no abdominal tenderness.  Musculoskeletal:     Right lower leg: Swelling present.     Left lower leg: Swelling present.     Comments: Swelling has improved from admission.  Skin:    General: Skin is warm.     Findings: No rash.  Neurological:     Mental Status: He is alert.      Condition at discharge: stable  The results of significant diagnostics from this hospitalization (including imaging, microbiology, ancillary and laboratory) are listed below for reference.   Imaging Studies: ECHOCARDIOGRAM COMPLETE Result Date: 05/15/2024    ECHOCARDIOGRAM REPORT   Patient Name:   Blake [name] Date of Exam: 05/15/2024 Medical Rec #:  [id number]         Height:       66.0 in Accession #:    [id number]        Weight:       156.3 lb Date of Birth:  16-Jul-1955        BSA:          1.801 m Patient Age:    36 years  BP:           142/70 mmHg Patient Gender: M                 HR:           63 bpm. Exam Location:  [facility] Procedure: 2D Echo, Cardiac Doppler and Color Doppler (Both Spectral and Color             Flow Doppler were utilized during procedure). Indications:     CHF-acute systolic I50.21  History:         Patient has prior history of Echocardiogram examinations, most                  recent 06/08/2019. CHF, Previous Myocardial Infarction, COPD;                  Risk Factors:Hypertension. Tobacco use.  Sonographer:     Christopher Furnace Referring Phys:  [id number] [facility] Diagnosing Phys: Evalene Lunger MD IMPRESSIONS  1. Left ventricular ejection fraction, by estimation, is 30 to 35%. Left ventricular ejection fraction by PLAX is 35 %. The left ventricle has moderately decreased function. The left ventricle demonstrates regional wall motion abnormalities (global hypokinesis with severe hypokinesis of the anterior, anterospetal and lateral wall ). Left ventricular diastolic parameters are consistent with Grade I diastolic dysfunction (impaired relaxation).  2. Right ventricular systolic function is mildly reduced. The right ventricular size is normal. There is moderately elevated pulmonary artery systolic pressure. The estimated right ventricular systolic pressure is 53.6 mmHg.  3. Large pleural effusion in the left lateral region.  4. The mitral valve is normal in structure. Moderate mitral valve regurgitation. No evidence of mitral stenosis.  5. Tricuspid valve regurgitation is moderate.  6. The aortic valve is tricuspid. Aortic valve regurgitation is mild. Aortic valve sclerosis is present, with no evidence of aortic valve stenosis.  7. The inferior vena cava is dilated in size with >50% respiratory variability, suggesting right atrial pressure of 8 mmHg. FINDINGS  Left Ventricle: Left ventricular ejection fraction, by estimation, is 30 to 35%. Left ventricular ejection fraction by PLAX is 35 %. The left ventricle has moderately decreased function. The left ventricle demonstrates regional wall motion abnormalities. Strain was performed and the global longitudinal strain is indeterminate. The left ventricular  internal cavity size was normal in size. There is no left ventricular hypertrophy. Left ventricular diastolic parameters are consistent with Grade I diastolic dysfunction (impaired relaxation). Right Ventricle: The right ventricular size is normal. No increase in right ventricular wall thickness. Right ventricular systolic function is mildly reduced. There is moderately elevated pulmonary artery systolic pressure. The tricuspid regurgitant velocity is 3.30 m/s, and with an assumed right atrial pressure of 10 mmHg, the estimated right ventricular systolic pressure is 53.6 mmHg. Left Atrium: Left atrial size was normal in size. Right Atrium: Right atrial size was normal in size. Pericardium: There is no evidence of pericardial effusion. Mitral Valve: The mitral valve is normal in structure. Moderate mitral valve regurgitation. No evidence of mitral valve stenosis. Tricuspid Valve: The tricuspid valve is normal in structure. Tricuspid valve regurgitation is moderate . No evidence of tricuspid stenosis. Aortic Valve: The aortic valve is tricuspid. Aortic valve regurgitation is mild. Aortic regurgitation PHT measures 311 msec. Aortic valve sclerosis is present, with no evidence of aortic valve stenosis. Aortic valve mean gradient measures 2.0 mmHg. Aortic valve peak gradient measures 4.2 mmHg. Aortic valve area, by VTI measures 4.21 cm. Pulmonic Valve: The pulmonic valve  was normal in structure. Pulmonic valve regurgitation is mild. No evidence of pulmonic stenosis. Aorta: The aortic root is normal in size and structure. Venous: The inferior vena cava is dilated in size with greater than 50% respiratory variability, suggesting right atrial pressure of 8 mmHg. IAS/Shunts: No atrial level shunt detected by color flow Doppler. Additional Comments: 3D was performed not requiring image post processing on an independent workstation and was indeterminate. There is a large pleural effusion in the left lateral region.  LEFT  VENTRICLE PLAX 2D LV EF:         Left            Diastology                ventricular     LV e' medial:    5.03 cm/s                ejection        LV E/e' medial:  21.1                fraction by     LV e' lateral:   6.09 cm/s                PLAX is 35      LV E/e' lateral: 17.4                %. LVIDd:         5.48 cm LVIDs:         4.54 cm LV PW:         1.33 cm LV IVS:        0.91 cm LVOT diam:     2.00 cm LV SV:         73 LV SV Index:   41 LVOT Area:     3.14 cm LV IVRT:       81 msec  LV Volumes (MOD) LV vol d, MOD    108.0 ml A2C: LV vol d, MOD    201.0 ml A4C: LV vol s, MOD    124.0 ml A4C: LV SV MOD A4C:   201.0 ml RIGHT VENTRICLE RV Basal diam:  4.25 cm RV Mid diam:    3.47 cm LEFT ATRIUM           Index        RIGHT ATRIUM           Index LA diam:      3.40 cm 1.89 cm/m   RA Area:     18.70 cm LA Vol (A4C): 57.1 ml 31.71 ml/m  RA Volume:   55.90 ml  31.04 ml/m  AORTIC VALVE                    PULMONIC VALVE AV Area (Vmax):    3.36 cm     PR End Diast Vel: 17.47 msec AV Area (Vmean):   2.98 cm AV Area (VTI):     4.21 cm AV Vmax:           102.00 cm/s AV Vmean:          65.700 cm/s AV VTI:            0.174 m AV Peak Grad:      4.2 mmHg AV Mean Grad:      2.0 mmHg LVOT Vmax:         109.00 cm/s LVOT Vmean:        62.300 cm/s  LVOT VTI:          0.233 m LVOT/AV VTI ratio: 1.34 AI PHT:            311 msec  AORTA Ao Root diam: 3.30 cm MITRAL VALVE                TRICUSPID VALVE MV Area (PHT): 3.24 cm     TR Peak grad:   43.6 mmHg MV Decel Time: 234 msec     TR Vmax:        330.00 cm/s MV E velocity: 106.00 cm/s MV A velocity: 42.70 cm/s   SHUNTS MV E/A ratio:  2.48         Systemic VTI:  0.23 m                             Systemic Diam: 2.00 cm Evalene Lunger MD Electronically signed by Evalene Lunger MD Signature Date/Time: 05/15/2024/1:08:30 PM    Final    DG Chest Portable 1 View Result Date: 05/14/2024 EXAM: 1 VIEW(S) XRAY OF THE CHEST 05/14/2024 03:54:41 PM COMPARISON: 02/08/2024 CLINICAL  HISTORY: short of breath FINDINGS: LUNGS AND PLEURA: Mild diffuse interstitial prominence. No focal pulmonary opacity. No pulmonary edema. No pleural effusion. No pneumothorax. HEART AND MEDIASTINUM: Mild cardiomegaly. Aortic atherosclerosis. Post-CABG changes noted. Mild central vascular prominence. BONES AND SOFT TISSUES: Intact sternotomy wires. No acute osseous abnormality. IMPRESSION: 1. Mild diffuse interstitial prominence, which may reflect early pulmonary edema due to CHF treatment . Electronically signed by: Waddell Calk MD 05/14/2024 04:13 PM EST RP Workstation: [id number]    Microbiology: Results for orders placed or performed during the hospital encounter of 05/14/24  Resp panel by RT-PCR (RSV, Flu A&B, Covid) Anterior Nasal Swab     Status: None   Collection Time: 05/14/24  3:41 PM   Specimen: Anterior Nasal Swab  Result Value Ref Range Status   SARS Coronavirus 2 by RT PCR NEGATIVE NEGATIVE Final    Comment: (NOTE) SARS-CoV-2 target nucleic acids are NOT DETECTED.  The SARS-CoV-2 RNA is generally detectable in upper respiratory specimens during the acute phase of infection. The lowest concentration of SARS-CoV-2 viral copies this assay can detect is 138 copies/mL. A negative result does not preclude SARS-Cov-2 infection and should not be used as the sole basis for treatment or other patient management decisions. A negative result may occur with  improper specimen collection/handling, submission of specimen other than nasopharyngeal swab, presence of viral mutation(s) within the areas targeted by this assay, and inadequate number of viral copies(<138 copies/mL). A negative result must be combined with clinical observations, patient history, and epidemiological information. The expected result is Negative.  Fact Sheet for Patients:  bloggercourse.com  Fact Sheet for Healthcare Providers:  seriousbroker.it  This test is no t  yet approved or cleared by the [place]  FDA and  has been authorized for detection and/or diagnosis of SARS-CoV-2 by FDA under an Emergency Use Authorization (EUA). This EUA will remain  in effect (meaning this test can be used) for the duration of the COVID-19 declaration under Section 564(b)(1) of the Act, 21 U.S.C.section 360bbb-3(b)(1), unless the authorization is terminated  or revoked sooner.       Influenza A by PCR NEGATIVE NEGATIVE Final   Influenza B by PCR NEGATIVE NEGATIVE Final    Comment: (NOTE) The Xpert Xpress SARS-CoV-2/FLU/RSV plus assay is intended as an aid in the diagnosis of influenza from Nasopharyngeal swab specimens and  should not be used as a sole basis for treatment. Nasal washings and aspirates are unacceptable for Xpert Xpress SARS-CoV-2/FLU/RSV testing.  Fact Sheet for Patients: bloggercourse.com  Fact Sheet for Healthcare Providers: seriousbroker.it  This test is not yet approved or cleared by the [place]  FDA and has been authorized for detection and/or diagnosis of SARS-CoV-2 by FDA under an Emergency Use Authorization (EUA). This EUA will remain in effect (meaning this test can be used) for the duration of the COVID-19 declaration under Section 564(b)(1) of the Act, 21 U.S.C. section 360bbb-3(b)(1), unless the authorization is terminated or revoked.     Resp Syncytial Virus by PCR NEGATIVE NEGATIVE Final    Comment: (NOTE) Fact Sheet for Patients: bloggercourse.com  Fact Sheet for Healthcare Providers: seriousbroker.it  This test is not yet approved or cleared by the [place]  FDA and has been authorized for detection and/or diagnosis of SARS-CoV-2 by FDA under an Emergency Use Authorization (EUA). This EUA will remain in effect (meaning this test can be used) for the duration of the COVID-19 declaration under Section 564(b)(1)  of the Act, 21 U.S.C. section 360bbb-3(b)(1), unless the authorization is terminated or revoked.  Performed at [facility], [address] [place], [place] [address]     Labs: CBC: Recent Labs  Lab 05/14/24 1541 05/15/24 0343 05/16/24 0345  WBC 7.1 4.3 9.6  NEUTROABS 5.1  --   --   HGB 12.6* 11.9* 12.7*  HCT 41.0 38.9* 40.4  MCV 99.0 99.2 98.1  PLT 210 203 225   Basic Metabolic Panel: Recent Labs  Lab 05/14/24 1541 05/14/24 1743 05/15/24 0343 05/16/24 0345 05/17/24 0531 05/18/24 0530  NA 140  --  141 141 138 140  K 3.7  --  3.8 3.6 4.0 3.8  CL 97*  --  98 95* 92* 95*  CO2 34*  --  34* 36* 35* 35*  GLUCOSE 106*  --  147* 80 99 109*  BUN 25*  --  24* 31* 30* 36*  CREATININE 1.09  --  1.12 1.10 1.12 1.21  CALCIUM  8.9  --  8.6* 9.0 8.9 8.8*  MG  --  1.8  --   --   --   --    Liver Function Tests: Recent Labs  Lab 05/14/24 1541  AST 33  ALT 18  ALKPHOS 93  BILITOT 0.6  PROT 8.0  ALBUMIN 3.0*   CBG: Recent Labs  Lab 05/15/24 1035 05/16/24 0808 05/17/24 0802 05/17/24 1202 05/18/24 0813  GLUCAP 138* 119* 176* 167* 153*    Discharge time spent: greater than 30 minutes.  Signed: Charlie Patterson, MD [facility] 05/18/2024

## 2024-05-18 NOTE — Discharge Instructions (Signed)
 Do not smoke with nicotine  patch on!

## 2024-05-18 NOTE — Progress Notes (Signed)
 Patient and family member given discharge instructions. No PIV to remove as patient had removed this hisself overnight. Patient and family expressed understanding and will follow up as instructed. Patient wheeled out to front with family member to wait for their ride as patient refused to wait in his room any longer.

## 2024-05-18 NOTE — Plan of Care (Signed)

## 2024-05-18 NOTE — Progress Notes (Signed)
 Patient ID: Blake [name], male   DOB: 10/07/1955, 68 y.o.   MRN: [id number] [facility]    SUBJECTIVE: Shortness of breath much improved feeling better no chest pain   Vitals:   05/17/24 2326 05/18/24 0333 05/18/24 0500 05/18/24 0818  BP: (!) 91/47 123/62  97/65  Pulse: 80 79  81  Resp: 17 18    Temp: 98 F (36.7 C) 97.7 F (36.5 C)  98.5 F (36.9 C)  TempSrc: Oral Oral    SpO2: 90% 94%  96%  Weight:   68.9 kg   Height:        No intake or output data in the 24 hours ending 05/18/24 1059    PHYSICAL EXAM  General: Well developed, well nourished, in no acute distress HEENT:  Normocephalic and atramatic Neck:  No JVD.  Lungs: Clear bilaterally to auscultation and percussion. Heart: HRRR . Normal S1 and S2 without gallops or murmurs.  Abdomen: Bowel sounds are positive, abdomen soft and non-tender  Msk:  Back normal, normal gait. Normal strength and tone for age. Extremities: No clubbing, cyanosis or edema.   Neuro: Alert and oriented X 3. Psych:  Good affect, responds appropriately   LABS: Basic Metabolic Panel: Recent Labs    05/17/24 0531 05/18/24 0530  NA 138 140  K 4.0 3.8  CL 92* 95*  CO2 35* 35*  GLUCOSE 99 109*  BUN 30* 36*  CREATININE 1.12 1.21  CALCIUM  8.9 8.8*   Liver Function Tests: No results for input(s): AST, ALT, ALKPHOS, BILITOT, PROT, ALBUMIN in the last 72 hours. No results for input(s): LIPASE, AMYLASE in the last 72 hours. CBC: Recent Labs    05/16/24 0345  WBC 9.6  HGB 12.7*  HCT 40.4  MCV 98.1  PLT 225   Cardiac Enzymes: No results for input(s): CKTOTAL, CKMB, CKMBINDEX, TROPONINI in the last 72 hours. BNP: Invalid input(s): POCBNP D-Dimer: No results for input(s): DDIMER in the last 72 hours. Hemoglobin A1C: No results for input(s): HGBA1C in the last 72 hours. Fasting Lipid Panel: No results for input(s): CHOL, HDL, LDLCALC, TRIG, CHOLHDL, LDLDIRECT in the last 72  hours. Thyroid  Function Tests: No results for input(s): TSH, T4TOTAL, T3FREE, THYROIDAB in the last 72 hours.  Invalid input(s): FREET3 Anemia Panel: No results for input(s): VITAMINB12, FOLATE, FERRITIN, TIBC, IRON, RETICCTPCT in the last 72 hours.  No results found.   Echo EF around 35%  TELEMETRY: :  ASSESSMENT AND PLAN:  Principal Problem:   Acute respiratory failure with hypoxia and hypercapnia (HCC) Active Problems:   S/P CABG (coronary artery bypass graft)   Tobacco abuse   Benign prostatic hyperplasia with urinary obstruction   Hyperlipidemia, unspecified   Acute on chronic systolic (congestive) heart failure (HCC)   COPD with acute exacerbation (HCC)   Acute metabolic encephalopathy   Memory loss    Plan Continue respiratory support inhalers BiPAP off of supplemental oxygen follow-up with pulmonary Acute on chronic systolic congestive heart failure EF around 35% continue Entresto Lasix  Farxiga spironolactone Toprol  for GDMT follow-up with heart failure COPD continue inhalers as necessary Zithromax for bronchitis follow-up with pulmonary Acute metabolic encephalopathy somewhat better probably related to hypoxemia has since improved Continue statin therapy for hyperlipidemia Vies patient refrain from tobacco abuse  Cara JONETTA Lovelace, MD 05/18/2024 10:59 AM

## 2024-05-18 NOTE — TOC Transition Note (Signed)
 Transition of Care [facility]) - Discharge Note   Patient Details  Name: Blake [name] MRN: [id number] Date of Birth: [birth year]  Transition of Care [facility]) CM/SW Contact:  Blake Jackquline RAMAN, RN Phone Number: 05/18/2024, 8:58 AM   Clinical Narrative:   RNCM received a message via secure chat from the MD stating that he was discharging the patient today and that he ordered a [facility] for him. RNCM emailed the order to [employer] to be delivered to the bedside.  RNCM called patients sister Blake [name] @ [phone] to confirm that the hospital bed was delivered for the patient yesterday. She said it wasn't and that [employer] will be delivering it to [name] and she will come pick up her brother once it's delivered and set up. RNCM informed her that the MD also ordered a BSC and that it will be delivered to the room today. She also gave me the phone number to the [facility] Agency that she wants to use for her brother. The contact person for the agency is Blake [name] @ [phone]. No further concerns. RNCM signing off.    Final next level of care: Home w Home Health Services Barriers to Discharge: Barriers Resolved   Patient Goals and CMS Choice            Discharge Placement                Patient to be transferred to facility by: Sister Name of family member notified: Blake [name] Patient and family notified of of transfer: 05/18/24  Discharge Plan and Services Additional resources added to the After Visit Summary for       Post Acute Care Choice: NA          DME Arranged: Hospital bed, 3-N-1 DME Agency: [employer] Date DME Agency Contacted: 05/18/24   Representative spoke with at DME Agency: DME orderd viaa email to [employer]            Social Drivers of Health (SDOH) Interventions SDOH Screenings   Food Insecurity: Food Insecurity Present (05/15/2024)  Housing: Low Risk  (05/15/2024)  Transportation Needs: No Transportation Needs (05/15/2024)  Utilities: Not At Risk (05/15/2024)   Alcohol Screen: Low Risk  (03/20/2023)  Depression (PHQ2-9): Low Risk  (12/19/2023)  Financial Resource Strain: Low Risk  (03/20/2023)  Physical Activity: Inactive (03/20/2023)  Social Connections: Socially Isolated (05/15/2024)  Stress: No Stress Concern Present (03/20/2023)  Tobacco Use: High Risk (05/14/2024)  Health Literacy: Inadequate Health Literacy (03/20/2023)     Readmission Risk Interventions     No data to display

## 2024-05-18 NOTE — TOC CM/SW Note (Signed)
 Patient is not able to walk the distance required to go the bathroom, or he/she is unable to safely negotiate stairs required to access the bathroom.  A 3in1 BSC will alleviate this problem

## 2024-05-19 ENCOUNTER — Telehealth: Payer: Self-pay

## 2024-05-19 NOTE — Progress Notes (Signed)
 Complex Care Management Note  Care Guide Note 05/19/2024 Name: Blake [name] MRN: [id number] DOB: 1955-08-19  Blake [name] is a 68 y.o. year old male who sees Cannady, Melanie T, NP for primary care. I reached out to Emma Tvedt by phone today to offer complex care management services.  Blake [name] was given information about Complex Care Management services today including:   The Complex Care Management services include support from the care team which includes your Nurse Care Manager, Clinical Social Worker, or Pharmacist.  The Complex Care Management team is here to help remove barriers to the health concerns and goals most important to you. Complex Care Management services are voluntary, and the patient may decline or stop services at any time by request to their care team member.   Complex Care Management Consent Status: Patient agreed to services and verbal consent obtained.   Follow up plan:  Telephone appointment with complex care management team member scheduled for:  [facility] 05/26/2024  Encounter Outcome:  Patient Scheduled  Jeoffrey Buffalo , RMA     [place]  [facility], [facility] Guide  Direct Dial: [phone]  Website: [email or web address]  .

## 2024-05-19 NOTE — Progress Notes (Signed)
 Care Guide Pharmacy Note  05/19/2024 Name: Blake [name] MRN: [id number] DOB: 1955-12-15  Referred By: Valerio Melanie DASEN, NP Reason for referral: Complex Care Management (Outreach to schedule with RNCM and Pharm d )   [name] is a 68 y.o. year old male who is a primary care patient of Cannady, Jolene T, NP.  Faris Suchecki was referred to the pharmacist for assistance related to: HTN and COPD  Successful contact was made with the patient to discuss pharmacy services including being ready for the pharmacist to call at least 5 minutes before the scheduled appointment time and to have medication bottles and any blood pressure readings ready for review. The patient agreed to meet with the pharmacist via telephone visit on (date/time).05/29/2024  Jeoffrey Buffalo , RMA     [place]  [facility], [facility] Guide  Direct Dial: [phone]  Website: [email or web address]

## 2024-05-23 ENCOUNTER — Telehealth: Payer: Self-pay

## 2024-05-23 NOTE — Progress Notes (Unsigned)
 Complex Care Management Care Guide Note  05/23/2024 Name: Blake [name] MRN: [id number] DOB: 29-Nov-1955  Blake [name] is a 68 y.o. year old male who is a primary care patient of Cannady, Jolene T, NP and is actively engaged with the care management team. I reached out to Obryan Swallows by phone today to assist with re-scheduling  with the RN Case Manager.  Follow up plan: Unsuccessful telephone outreach attempt made. A HIPAA compliant phone message was left for the patient providing contact information and requesting a return call.  [facility], [facility] Guide  Direct Dial: [phone]  Fax [phone]

## 2024-05-25 NOTE — Progress Notes (Signed)
 [facility] Cardiology    SUBJECTIVE: Patient improving slowly not as confused not as short of breath denies any pain he is able to ambulate in his room without difficulty no palpitations or tachycardia   Vitals:   05/17/24 2326 05/18/24 0333 05/18/24 0500 05/18/24 0818  BP: (!) 91/47 123/62  97/65  Pulse: 80 79  81  Resp: 17 18    Temp: 98 F (36.7 C) 97.7 F (36.5 C)  98.5 F (36.9 C)  TempSrc: Oral Oral    SpO2: 90% 94%  96%  Weight:   68.9 kg   Height:        No intake or output data in the 24 hours ending 05/25/24 1558    PHYSICAL EXAM  General: Well developed, well nourished, in no acute distress HEENT:  Normocephalic and atramatic Neck:  No JVD.  Lungs: Clear bilaterally to auscultation and percussion. Heart: HRRR . Normal S1 and S2 without gallops or murmurs.  Abdomen: Bowel sounds are positive, abdomen soft and non-tender  Msk:  Back normal, normal gait. Normal strength and tone for age. Extremities: No clubbing, cyanosis or edema.   Neuro: Alert and oriented X 3. Psych:  Good affect, responds appropriately   LABS: Basic Metabolic Panel: No results for input(s): NA, K, CL, CO2, GLUCOSE, BUN, CREATININE, CALCIUM , MG, PHOS in the last 72 hours. Liver Function Tests: No results for input(s): AST, ALT, ALKPHOS, BILITOT, PROT, ALBUMIN in the last 72 hours. No results for input(s): LIPASE, AMYLASE in the last 72 hours. CBC: No results for input(s): WBC, NEUTROABS, HGB, HCT, MCV, PLT in the last 72 hours. Cardiac Enzymes: No results for input(s): CKTOTAL, CKMB, CKMBINDEX, TROPONINI in the last 72 hours. BNP: Invalid input(s): POCBNP D-Dimer: No results for input(s): DDIMER in the last 72 hours. Hemoglobin A1C: No results for input(s): HGBA1C in the last 72 hours. Fasting Lipid Panel: No results for input(s): CHOL, HDL, LDLCALC, TRIG, CHOLHDL, LDLDIRECT in the last 72 hours. Thyroid  Function  Tests: No results for input(s): TSH, T4TOTAL, T3FREE, THYROIDAB in the last 72 hours.  Invalid input(s): FREET3 Anemia Panel: No results for input(s): VITAMINB12, FOLATE, FERRITIN, TIBC, IRON, RETICCTPCT in the last 72 hours.  No results found.   Echo moderate range.  Ventricular function of 35% globally  TELEMETRY: Normal sinus rhythm rate of 90 nonspecific ST-T wave changes:  ASSESSMENT AND PLAN:  Principal Problem:   Acute respiratory failure with hypoxia and hypercapnia (HCC) Active Problems:   S/P CABG (coronary artery bypass graft)   Tobacco abuse   Benign prostatic hyperplasia with urinary obstruction   Hyperlipidemia, unspecified   Acute on chronic systolic (congestive) heart failure (HCC)   COPD with acute exacerbation (HCC)   Acute metabolic encephalopathy   Memory loss    Plan Acute systolic congestive heart failure continue diuresis COPD shortness of breath bronchitis continue inhalers advised patient refrain from tobacco supplemental oxygen should be weaned Smoking by history advised patient refrain from tobacco abuse Coronary bypass surgery multivessel stable no recent angina Ischemic cardiomyopathy continue GDMT with Toprol  spironolactone Farxiga and Entresto Have the patient follow-up with heart failure clinic Increase activity ambulate in the halls Patient should be referred to cardiac rehab for heart failure management Follow-up with cardiology once he is discharged in 1 to 2 weeks   Cara JONETTA Lovelace, MD,  05/25/2024 3:58 PM

## 2024-05-26 ENCOUNTER — Telehealth

## 2024-05-26 ENCOUNTER — Telehealth: Payer: Self-pay | Admitting: Family

## 2024-05-26 NOTE — Progress Notes (Deleted)
 Advanced Heart Failure Clinic Note   Referring Physician: PCP: Valerio Melanie DASEN, NP PCP-Cardiologist: Evalene Lunger, MD   Chief Complaint:  HPI:  Blake [name] is a 68 yo male patient with a PMH significant for CHF, prior MI's.   Echo report from 12/17/20 showed an EF of 30-35% along with moderate/ severe MR, mild/moderate AS and mild PHTN.   He was recently in the ED on 05/16/21 for acute on chronic congestive heart failure. He stated that he had been having increased pedal edema x 1 week. He had not been on lasix  for some time, he was not sure why or when it was stopped. He was diuresed and discharged home to f/u with cardiology and the HF clinic.  He presents today for his initial visit, with complaints of mild fatigue with moderate exertion. He reports feeling much better since he left the ED. He denies CP, SOB, palpitations, worsening edema, dizziness or syncope.   He is a poor historian. He lives with his sister, who helps him with his medications and appointments. He is accompanied by his SO today, but she is not familiar with his medical history. Called his sister to help determine what medications he is currently on, but she was not sure what exactly he takes either.  [facility].    ROS: All systems negative except what is listed in HPI, PMH and Problem List   Past Medical History:  Diagnosis Date   Acute headache    CHF (congestive heart failure) (HCC)    COPD (chronic obstructive pulmonary disease) (HCC)    Coronary artery disease    a. cath 2009 w/ severe 3-vessel dz, rec bypass; b. MI 2012-->4-vessel cabg; c. cath 2016: occluded VG-OM and VG-RPDA, patent LIMA-LAD and patent VG-Diag, 95% stenosis of mid LCx s/p PCI/DES 0% residual stenosis   Depression    Hepatitis B    Hepatitis C    HFrEF (heart failure with reduced ejection fraction) (HCC)    a. LV gram 2009 with EF 32% with anterolateral and apical HK   Hyperlipidemia    Hypertension    MI  (myocardial infarction) (HCC)    x 2   Tobacco abuse     Current Outpatient Medications  Medication Sig Dispense Refill   albuterol  (VENTOLIN  HFA) 108 (90 Base) MCG/ACT inhaler Inhale 2 puffs into the lungs every 6 (six) hours as needed. 18 g 2   aspirin  81 MG chewable tablet Chew 1 tablet (81 mg total) by mouth daily. 90 tablet 4   atorvastatin  (LIPITOR) 40 MG tablet Take 1 tablet (40 mg total) by mouth daily. 90 tablet 4   azithromycin (ZITHROMAX) 250 MG tablet One tab po daily for two days 2 each 0   Cholecalciferol  (VITAMIN D3) 1.25 MG (50000 UT) CAPS Take 1 capsule (50,000 Units total) by mouth every 7 (seven) days. 12 capsule 4   Cyanocobalamin  (B-12) 1000 MCG TABS Take 1 tablet (1,000 mcg total) by mouth daily. 90 tablet 4   dapagliflozin propanediol (FARXIGA) 10 MG TABS tablet Take 1 tablet (10 mg total) by mouth daily. 30 tablet 0   furosemide  (LASIX ) 40 MG tablet Take 1 tablet (40 mg total) by mouth daily. 30 tablet 0   metoprolol  succinate (TOPROL -XL) 25 MG 24 hr tablet Take 0.5 tablets (12.5 mg total) by mouth daily. 15 tablet 0   nicotine  (NICODERM CQ  - DOSED IN MG/24 HOURS) 14 mg/24hr patch One patch 14mg  chest wall daily (okay to substitute generic)  28 patch 0   nitroGLYCERIN  (NITROSTAT ) 0.4 MG SL tablet Place 1 tablet (0.4 mg total) under the tongue every 5 (five) minutes as needed for chest pain. Reported on 09/29/2015 25 tablet 1   omeprazole  (PRILOSEC) 20 MG capsule Take 1 capsule (20 mg total) by mouth daily. 90 capsule 4   rivastigmine  (EXELON ) 3 MG capsule Take 1 capsule (3 mg total) by mouth 2 (two) times daily. 180 capsule 4   sacubitril-valsartan (ENTRESTO) 24-26 MG Take 1 tablet by mouth 2 (two) times daily. 60 tablet 0   spironolactone (ALDACTONE) 25 MG tablet Take 1 tablet (25 mg total) by mouth daily. 30 tablet 0   traZODone  (DESYREL ) 50 MG tablet Take 0.5-1 tablets (25-50 mg total) by mouth at bedtime as needed for sleep. 90 tablet 3   umeclidinium-vilanterol  (ANORO ELLIPTA ) 62.5-25 MCG/ACT AEPB Inhale 1 puff into the lungs daily at 6 (six) AM. 60 each 4   No current facility-administered medications for this visit.    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   Sexual activity: Not Currently  Other Topics Concern   Not on file  Social History Narrative   Not on file   Social Drivers of Health   Financial Resource Strain: Low Risk  (03/20/2023)   Overall Financial Resource Strain (CARDIA)    Difficulty of Paying Living Expenses: Not very hard  Food Insecurity: Food Insecurity Present (05/15/2024)   Hunger Vital Sign    Worried About Running Out of Food in the Last Year: Sometimes true    Ran Out of Food in the Last Year: Sometimes true  Transportation Needs: No Transportation Needs (05/15/2024)   PRAPARE - Administrator, Civil Service (Medical): No    Lack of Transportation (Non-Medical): No  Physical Activity: Inactive (03/20/2023)   Exercise Vital Sign    Days of Exercise per Week: 0 days    Minutes of Exercise per Session: 0 min  Stress: No Stress Concern Present (03/20/2023)   [employer] of Occupational Health - Occupational Stress Questionnaire    Feeling of Stress : Only a little  Social Connections: Socially Isolated (05/15/2024)   Social Connection and Isolation Panel    Frequency of Communication with Friends and Family: Twice a week     Frequency of Social Gatherings with Friends and Family: Twice a week    Attends Religious Services: Never    Database Administrator or Organizations: No    Attends Banker Meetings: Never    Marital Status: Separated  Intimate Partner Violence: Not At Risk (05/15/2024)   Humiliation, Afraid, Rape, and Kick questionnaire    Fear of Current or Ex-Partner: No    Emotionally Abused: No    Physically Abused: No    Sexually  Abused: No      Family History  Problem Relation Age of Onset   Hypertension Mother    Hypertension Father    Hypertension Sister    Hypertension Sister        PHYSICAL EXAM: General:  Well appearing. No respiratory difficulty HEENT: normal Neck: supple. no JVD. Carotids 2+ bilat; no bruits. No lymphadenopathy or thyromegaly appreciated. Cor: PMI nondisplaced. Regular rate & rhythm. No rubs, gallops or murmurs. Lungs: clear Abdomen: soft, nontender, nondistended. No hepatosplenomegaly. No bruits or masses. Good bowel sounds. Extremities: no cyanosis, clubbing, rash, edema Neuro: alert & oriented x 3, cranial nerves grossly intact. moves all 4 extremities w/o difficulty. Affect pleasant.  ECG:   ASSESSMENT & PLAN:  1: Heart failure with reduced ejection fraction- - NYHA class II - Euvolemic today - not weighing daily, scales provided - encouraged to call for wt gain > 2 lbs/overnight or 5 lbs/week  - discussed low sodium diet, provided low sodium cookbook - discrepancy regarding his beta-blocker, he has carvedilol present but on record metoprolol  succinate and he reports he takes both - contacted his sister to help decipher what he is taking, she was not sure. Called pharmacy, but they were at lunch. - on lisinopril  and BB - advised to continue taking carvedilol and if he has metoprolol  succinate at home to NOT take  - start lasix  20 mg QD - check BMP today - will anticipate changing to Entresto at next visit, did not today in light of  confusion surrounding his current medication regimen - provided contact to get re-established with Dr. Florencio - BNP 730 on 05/16/21  2: Hypertension-  - BP today 154/84 - BMP reviewed, sodium 140, potassium 4.0, Cr 0.95, GFR > 60 - currently without a PCP, assisted with scheduling appt for new PCP @ [facility]   3: Cigarette use - smokes 1/2 PPD - counseled on complete cessation for 3 minutes  Medications reviewed, patient brought some medications with him. Attempted to reconcile but he is a poor historian and his sister was unable to help.   Return in one month.      Ellouise DELENA Class, FNP 05/26/24

## 2024-05-26 NOTE — Telephone Encounter (Signed)
 Called to confirm/remind patient of their appointment at the Advanced Heart Failure Clinic on 05/27/24.   Appointment:   [] Confirmed  [] Left mess   [] No answer/No voice mail  [x] VM Full/unable to leave message  [] Phone not in service  Patient reminded to bring all medications and/or complete list.  Confirmed patient has transportation. Gave directions, instructed to utilize valet parking.

## 2024-05-27 ENCOUNTER — Inpatient Hospital Stay: Admitting: Family

## 2024-05-27 ENCOUNTER — Other Ambulatory Visit: Payer: Self-pay

## 2024-05-27 ENCOUNTER — Other Ambulatory Visit (HOSPITAL_COMMUNITY): Payer: Self-pay

## 2024-05-27 ENCOUNTER — Telehealth: Payer: Self-pay | Admitting: Family

## 2024-05-27 ENCOUNTER — Other Ambulatory Visit: Payer: Self-pay | Admitting: Nurse Practitioner

## 2024-05-27 NOTE — Telephone Encounter (Signed)
 Patient did not show for his Heart Failure Clinic appointment on 05/27/24.

## 2024-05-28 ENCOUNTER — Ambulatory Visit: Admitting: Podiatry

## 2024-05-28 ENCOUNTER — Ambulatory Visit

## 2024-05-28 ENCOUNTER — Encounter: Payer: Self-pay | Admitting: Podiatry

## 2024-05-28 ENCOUNTER — Other Ambulatory Visit: Payer: Self-pay

## 2024-05-28 VITALS — Ht 66.0 in | Wt 151.9 lb

## 2024-05-28 DIAGNOSIS — M19271 Secondary osteoarthritis, right ankle and foot: Secondary | ICD-10-CM | POA: Diagnosis not present

## 2024-05-28 DIAGNOSIS — R600 Localized edema: Secondary | ICD-10-CM | POA: Diagnosis not present

## 2024-05-28 DIAGNOSIS — M216X1 Other acquired deformities of right foot: Secondary | ICD-10-CM

## 2024-05-28 DIAGNOSIS — M79674 Pain in right toe(s): Secondary | ICD-10-CM

## 2024-05-28 DIAGNOSIS — M216X2 Other acquired deformities of left foot: Secondary | ICD-10-CM | POA: Diagnosis not present

## 2024-05-28 DIAGNOSIS — B351 Tinea unguium: Secondary | ICD-10-CM | POA: Diagnosis not present

## 2024-05-28 DIAGNOSIS — M19272 Secondary osteoarthritis, left ankle and foot: Secondary | ICD-10-CM | POA: Diagnosis not present

## 2024-05-28 DIAGNOSIS — M79675 Pain in left toe(s): Secondary | ICD-10-CM

## 2024-05-28 DIAGNOSIS — M216X9 Other acquired deformities of unspecified foot: Secondary | ICD-10-CM

## 2024-05-28 NOTE — Progress Notes (Signed)
  Subjective:  Patient ID: Blake [name], male    DOB: 1955-08-01,  MRN: [id number]  Chief Complaint  Patient presents with   Foot Problem    Rm 3 Patient is here for bilateral foot deformity. Pt states walking on the back shoes and constant swelling feet. Pt is also concerned with thick discolored nails.    Discussed the use of AI scribe software for clinical note transcription with the patient, who gave verbal consent to proceed.  History of Present Illness Blake [name] is a 68 year old male with congestive heart failure who presents with foot swelling and pain.  He has persistent swelling in his feet, which he attributes to his history of congestive heart failure. The swelling is significant enough to prevent him from wearing his shoes properly, leading him to walk on the backs of his shoes. The swelling is painful and impacts his ability to wear shoes comfortably.  He experiences pain in his feet, that comes and goes. He uses Tylenol  for pain management but is unable to use anti-inflammatory medications due to his heart condition.  He reports a strong odor from his feet when wearing shoes. He has previously had toenails removed due to fungal infections.  They have grown back and are thick and elongated cause pain issues and he is unable to cut them  He is currently taking Tylenol  and Benadryl.      Objective:    Physical Exam VASCULAR: Weakly palpable pulses. Foot is warm and well-perfused. Capillary fill time is brisk. DERMATOLOGIC: Tinea pedis present. Thick, elongated mycotic nails x 10. 1+ edema. NEUROLOGIC: Normal sensation to light touch and pressure. No paresthesias on examination. ORTHOPEDIC: No pain to palpation.  He has pes planus deformity.   No images are attached to the encounter.    Results RADIOLOGY Foot radiographs: Pes planus deformity and mild degenerative changes in hindfoot (05/28/2024)   Assessment:   1. Other secondary osteoarthritis of  both feet   2. Peripheral edema      Plan:  Patient was evaluated and treated and all questions answered.  Assessment and Plan Assessment & Plan Tinea pedis and onychomycosis Presence of tinea pedis with associated foot odor and mycotic nails. No acute pain reported. - Prescribed antifungal cream for tinea pedis - Trimmed toenails  Edema of feet Chronic edema likely secondary to congestive heart failure and possibly renal issues. Swelling affects shoe fitting and comfort. - Recommended compression stockings - Wrote prescription for compression stockings 20 to 30 mmHg - Advised wearing supportive shoes such as New Balance or Ortho Feet  Foot osteoarthritis Mild degenerative changes in the hind foot. Pain present but not severe enough to warrant surgical intervention. - Recommended Tylenol  for pain management - Suggested Voltaren gel for topical anti-inflammatory effect      Return if symptoms worsen or fail to improve.

## 2024-05-28 NOTE — Patient Instructions (Signed)
  VISIT SUMMARY: Today, you were seen for foot swelling and pain. You have a history of congestive heart failure, which contributes to the swelling in your feet. You also experience foot pain due to arthritis and have a history of fungal infections in your toenails.  YOUR PLAN: -TINEA PEDIS AND ONYCHOMYCOSIS: Tinea pedis is a fungal infection of the skin on the feet, and onychomycosis is a fungal infection of the toenails. You were prescribed an antifungal cream to treat the skin infection, and your toenails were trimmed to help manage the nail infection.  -EDEMA OF FEET: Edema is swelling caused by excess fluid trapped in your body's tissues. Your foot swelling is likely due to your congestive heart failure and possibly kidney issues. You were advised to use compression stockings to help reduce the swelling and were given a prescription for them. Additionally, you should wear supportive shoes like New Balance or Ortho Feet to improve comfort.  -FOOT OSTEOARTHRITIS: Osteoarthritis is a condition where the cartilage in your joints wears down over time. You have mild degenerative changes in your hind foot causing pain. You should continue using Tylenol  for pain relief and can also use Voltaren gel for its anti-inflammatory effects.  INSTRUCTIONS: Please follow up as needed if your symptoms do not improve or if you experience any new issues. Make sure to use the antifungal cream as prescribed, wear your compression stockings, and use supportive shoes. Continue with Tylenol  and try Voltaren gel for pain management.                      Contains text generated by Abridge.                                 Contains text generated by Abridge.

## 2024-05-29 ENCOUNTER — Other Ambulatory Visit: Payer: Self-pay

## 2024-05-29 NOTE — Progress Notes (Unsigned)
 05/29/2024 Name: Blake [name] MRN: [id number] DOB: 13-Sep-1955  Chief Complaint  Patient presents with   Medication Assistance   Volney Haub is a 68 y.o. year old male who presented for a telephone visit.   They were referred to the pharmacist by their PCP for assistance in managing medication access.   Subjective:  Care Team: Primary Care Provider: Cannady, Jolene T, NP   Medication Access/Adherence  [facility] [address] [place] [facility] [address] [place] [address] Phone: [phone] Fax: [phone]  Patient reports affordability concerns with their medications: No  Patient reports access/transportation concerns to their pharmacy: No  Patient reports adherence concerns with their medications:  No    Medication Management: -Current adherence strategy: receives medications in adherence packaging via home delivery from [facility] outpatient pharmacy -Patient reports Good adherence to medications.  Patient's sister, [name], gives him his medications and endorses good adherence to his maintenance medications. -Patient was recently in the hospital, and some medication changes were made.  [facility] to [facility] if patient is to continue these.  Recent fill dates:  Albuterol  HFA- [facility] ASA 81mg - 30 day supply filled 10/29 at [facility]- refills on file at [facility] Atorvastatin  40mg - 30 day supply at [facility] 11/18- refills on file at [facility] Cholecalciferol  50k units- 84 day supply filled at [facility] 11/18- refills on file at [facility] B12 - 30 day supply filled at [facility] 10/29- refills on file at [facility] Farxiga  10mg - [facility] 11[facility]- no additional refills sent by discharging hospital provider Furosemide  40mg - [facility] 11[facility]17 but was not picked up- [facility] filled 30 day supply 10/29- no additional refills sent by discharging hospital  provider Metoprolol  XL 25mg - [facility]17 but was not picked up- patient had received from [facility] 10/29, but directions have changed from 1 tablet daily to one-half daily- no additional refills sent by discharging hospital provider Nitrostat - 25 tabs filled at [facility] 2/26 (prn ned)- refills on file at [facility] Omeprazole  20mg - 30 day supply at [facility] 11/18- refills on file at [facility] Rivastigmaine 3mg - 30 day supply at [facility] 11/18- refills on file at [facility] Entresto  26/26mg - [facility], but has not been filled- sent for 1 month supply with no additional refills from discharging hospital provider Spironolactone  25mg - 11/9- no additional refills sent by discharging hospital provider Trazodone  50mg - 90 tabs filled at [facility] 6/17 (prn med)- refills on file at [facility] Anoro- 30 day supply [facility]  Objective:  Lab Results  Component Value Date   CREATININE 1.21 05/18/2024   BUN 36 (H) 05/18/2024   NA 140 05/18/2024   K 3.8 05/18/2024   CL 95 (L) 05/18/2024   CO2 35 (H) 05/18/2024   Lab Results  Component Value Date   CHOL 117 12/19/2023   HDL 43 12/19/2023   LDLCALC 60 12/19/2023   TRIG 63 12/19/2023   CHOLHDL 3.8 06/08/2019   Medications Reviewed Today     Reviewed by Deanna Channing LABOR, [facility] (Pharmacist) on 05/29/24 at 1737  Med List Status: <None>   Medication Order Taking? Sig Documenting Provider Last Dose Status Informant  albuterol  (VENTOLIN  HFA) 108 (90 Base) MCG/ACT inhaler [id number] Yes Inhale 2 puffs into the lungs every 6 (six) hours as needed. Valerio Melanie DASEN, NP  Active Family Member, Pharmacy  Records  aspirin  81 MG chewable tablet [id number] Yes Chew 1 tablet (81 mg total) by mouth daily. Valerio Melanie DASEN, NP  Active Family Member, Pharmacy Records  atorvastatin  (LIPITOR) 40 MG tablet [id number] Yes Take 1 tablet (40 mg total) by mouth daily. Valerio Melanie DASEN, NP  Active Family Member, Pharmacy Records    Discontinued 05/29/24 1735 (Completed  Course)   Cholecalciferol  (VITAMIN D3) 1.25 MG (50000 UT) CAPS [id number] Yes Take 1 capsule (50,000 Units total) by mouth every 7 (seven) days. Valerio Melanie T, NP  Active Family Member, Pharmacy Records  Cyanocobalamin  (B-12) 1000 MCG TABS [id number] Yes Take 1 tablet (1,000 mcg total) by mouth daily. Valerio Melanie DASEN, NP  Active Family Member, Pharmacy Records  dapagliflozin  propanediol (FARXIGA ) 10 MG TABS tablet [id number] Yes Take 1 tablet (10 mg total) by mouth daily. Josette Ade, MD  Active   furosemide  (LASIX ) 40 MG tablet [id number] Yes Take 1 tablet (40 mg total) by mouth daily. Josette Ade, MD  Active   metoprolol  succinate (TOPROL -XL) 25 MG 24 hr tablet [id number] Yes Take 0.5 tablets (12.5 mg total) by mouth daily. Josette Ade, MD  Active   nicotine  (NICODERM CQ  - DOSED IN MG/24 HOURS) 14 mg/24hr patch [id number]  One patch 14mg  chest wall daily (okay to substitute generic) Josette Ade, MD  Active   nitroGLYCERIN  (NITROSTAT ) 0.4 MG SL tablet [id number] Yes Place 1 tablet (0.4 mg total) under the tongue every 5 (five) minutes as needed for chest pain. Reported on 09/29/2015 Cannady, Jolene T, NP  Active Family Member, Pharmacy Records  omeprazole  [facility]) 20 MG capsule [id number] Yes Take 1 capsule (20 mg total) by mouth daily. Valerio Melanie T, NP  Active Family Member, Pharmacy Records  rivastigmine  (EXELON ) 3 MG capsule [id number] Yes Take 1 capsule (3 mg total) by mouth 2 (two) times daily. Valerio Melanie DASEN, NP  Active Family Member, Pharmacy Records  sacubitril -valsartan  (ENTRESTO ) 24-26 MG [id number] Yes Take 1 tablet by mouth 2 (two) times daily. Josette Ade, MD  Active   spironolactone  (ALDACTONE ) 25 MG tablet [id number] Yes Take 1 tablet (25 mg total) by mouth daily. Josette Ade, MD  Active   traZODone  (DESYREL ) 50 MG tablet [id number] Yes Take 0.5-1 tablets (25-50 mg total) by mouth at bedtime as needed for sleep. Valerio Melanie DASEN, NP  Active Family Member,  Pharmacy Records  umeclidinium-vilanterol (ANORO ELLIPTA ) 62.5-25 MCG/ACT AEPB [id number] Yes Inhale 1 puff into the lungs daily at 6 (six) AM. Valerio Melanie DASEN, NP  Active Family Member, Pharmacy Records           Assessment/Plan:   Medication Management: -Currently strategy insufficient to maintain appropriate adherence to prescribed medication regimen -Coordinating with PCP and [facility] to get needed transfers/new prescriptions for patient's adherence packs and home delivery Albuterol  HFA- [facility]- [facility]- asked [facility] to transfer over and place on hold ASA 81mg - 30 day supply filled 10/29 at [facility]- refills on file at [facility] Atorvastatin  40mg - 30 day supply at [facility] 11/18- refills on file at [facility] Cholecalciferol  50k units- 84 day supply filled at [facility] 11/18- refills on file at [facility] B12 1000mcg- 30 day supply filled at [facility] 10/29- refills on file at [facility] Farxiga  10mg - [facility] 11[facility]- no additional refills sent by discharging hospital provider- will consult PCP Furosemide  40mg - [facility] 11/17 but was not picked up- [facility] filled 30 day supply 10/29- no additional refills sent by discharging hospital provider-  asked [facility] to transfer, fill and deliver ASAP Metoprolol  XL 25mg - [facility] 11[facility]17 but was not picked up- patient had received from [facility] 10/29, but directions have changed from 1 tablet daily to one-half daily- no additional refills sent by discharging hospital provider- asked [facility] to transfer, fill, and deliver ASAP Nitrostat - 25 tabs filled at [facility] 2/26 (prn ned)- refills on file at [facility] Omeprazole  20mg - 30 day supply at [facility] 11/18- refills on file at [facility] Rivastigmaine 3mg - 30 day supply at [facility] 11/18- refills on file at [facility] Entresto  26/26mg - [facility], but has not been filled- sent for 1 month supply with no additional refills from discharging hospital provider- asked [facility] to transfer, fill and deliver ASAP Spironolactone  25mg -  30 day supply picked up 11/9- no additional refills sent by discharging hospital provider- will consult PCP Trazodone  50mg - 90 tabs filled at [facility] 11/24 (prn med)- refills on file at [facility] Anoro- 30 day [facility]- [facility]- asked [facility] to transfer over and place on hold -Patient missed 11/18 HF follow-up with cardiology and does not have a follow-up scheduled with PCP at this time  [name], PharmD, DPLA

## 2024-05-30 ENCOUNTER — Other Ambulatory Visit: Payer: Self-pay

## 2024-05-30 NOTE — Telephone Encounter (Signed)
 Requested medication (s) are due for refill today: see last ordering provider  Requested medication (s) are on the active medication list: yes   Last refill:  lasix - 05/18/24 #30 0 refills, toprol - 05/18/24 #15 0 refills  Future visit scheduled: na   Notes to clinic:  medications last ordered by R. Wieting, MD 05/18/24. Do you want to refill Rxs?     Requested Prescriptions  Pending Prescriptions Disp Refills   furosemide  (LASIX ) 40 MG tablet 90 tablet 4    Sig: Take 1 tablet (40 mg total) by mouth daily.     Cardiovascular:  Diuretics - Loop Failed - 05/30/2024  9:49 AM      Failed - Ca in normal range and within 180 days    Calcium   Date Value Ref Range Status  05/18/2024 8.8 (L) 8.9 - 10.3 mg/dL Final   Calcium , Total  Date Value Ref Range Status  10/27/2014 8.1 (L) mg/dL Final    Comment:    1.0-89.6 NOTE: New Reference Range  09/15/14          Failed - Cl in normal range and within 180 days    Chloride  Date Value Ref Range Status  05/18/2024 95 (L) 98 - 111 mmol/L Final  10/27/2014 109 mmol/L Final    Comment:    101-111 NOTE: New Reference Range  09/15/14          Passed - K in normal range and within 180 days    Potassium  Date Value Ref Range Status  05/18/2024 3.8 3.5 - 5.1 mmol/L Final  10/27/2014 3.6 mmol/L Final    Comment:    3.5-5.1 NOTE: New Reference Range  09/15/14          Passed - Na in normal range and within 180 days    Sodium  Date Value Ref Range Status  05/18/2024 140 135 - 145 mmol/L Final  05/07/2024 142 134 - 144 mmol/L Final  10/27/2014 137 mmol/L Final    Comment:    135-145 NOTE: New Reference Range  09/15/14          Passed - Cr in normal range and within 180 days    Creatinine  Date Value Ref Range Status  10/27/2014 0.91 mg/dL Final    Comment:    9.38-8.75 NOTE: New Reference Range  09/15/14    Creatinine, Ser  Date Value Ref Range Status  05/18/2024 1.21 0.61 - 1.24 mg/dL Final         Passed - Mg  Level in normal range and within 180 days    Magnesium   Date Value Ref Range Status  05/14/2024 1.8 1.7 - 2.4 mg/dL Final    Comment:    Performed at [facility], [address] [place], [place] [address]         Passed - Last BP in normal range    BP Readings from Last 1 Encounters:  05/18/24 97/65         Passed - Valid encounter within last 6 months    Recent Outpatient Visits           2 weeks ago Acute on chronic systolic (congestive) heart failure (HCC)   [facility] [place], Melanie T, NP   3 weeks ago SOB (shortness of breath)   [facility] Herold Hadassah SQUIBB, MD   5 months ago Dementia without behavioral disturbance [facility])   [place] [facility] Valerio Melanie DASEN, NP  9 months ago Chronic systolic CHF (congestive heart failure) (HCC)   [place] [facility] [place], Jolene T, NP               metoprolol  succinate (TOPROL -XL) 25 MG 24 hr tablet 90 tablet 4    Sig: Take 1 tablet (25 mg total) by mouth daily.     Cardiovascular:  Beta Blockers Passed - 05/30/2024  9:49 AM      Passed - Last BP in normal range    BP Readings from Last 1 Encounters:  05/18/24 97/65         Passed - Last Heart Rate in normal range    Pulse Readings from Last 1 Encounters:  05/18/24 81         Passed - Valid encounter within last 6 months    Recent Outpatient Visits           2 weeks ago Acute on chronic systolic (congestive) heart failure (HCC)   [place] [facility] [place], [place] T, NP   3 weeks ago SOB (shortness of breath)   [place] [facility] Herold Hadassah SQUIBB, MD   5 months ago Dementia without behavioral disturbance (HCC)   [place] [place], [place] T, NP   9 months ago Chronic systolic CHF (congestive heart failure) (HCC)   [place] [facility] [place], [place] T, NP              Refused  Prescriptions Disp Refills   lisinopril  (ZESTRIL ) 5 MG tablet 90 tablet 4    Sig: Take 1 tablet (5 mg total) by mouth daily.     Cardiovascular:  ACE Inhibitors Passed - 05/30/2024  9:49 AM      Passed - Cr in normal range and within 180 days    Creatinine  Date Value Ref Range Status  10/27/2014 0.91 mg/dL Final    Comment:    9.38-8.75 NOTE: New Reference Range  09/15/14    Creatinine, Ser  Date Value Ref Range Status  05/18/2024 1.21 0.61 - 1.24 mg/dL Final         Passed - K in normal range and within 180 days    Potassium  Date Value Ref Range Status  05/18/2024 3.8 3.5 - 5.1 mmol/L Final  10/27/2014 3.6 mmol/L Final    Comment:    3.5-5.1 NOTE: New Reference Range  09/15/14          Passed - Patient is not pregnant      Passed - Last BP in normal range    BP Readings from Last 1 Encounters:  05/18/24 97/65         Passed - Valid encounter within last 6 months    Recent Outpatient Visits           2 weeks ago Acute on chronic systolic (congestive) heart failure (HCC)   [facility] [facility] [place], Melanie T, NP   3 weeks ago SOB (shortness of breath)   [place] [facility] Herold Hadassah SQUIBB, MD   5 months ago Dementia without behavioral disturbance [facility])   [place] [facility] [place], Jolene T, NP   9 months ago Chronic systolic CHF (congestive heart failure) (HCC)    [facility] [place], Melanie DASEN, NP

## 2024-05-30 NOTE — Telephone Encounter (Signed)
 Requested by interface surescripts. Medication discontinued 05/18/24.  Requested Prescriptions  Pending Prescriptions Disp Refills   furosemide  (LASIX ) 40 MG tablet 90 tablet 4    Sig: Take 1 tablet (40 mg total) by mouth daily.     Cardiovascular:  Diuretics - Loop Failed - 05/30/2024  9:48 AM      Failed - Ca in normal range and within 180 days    Calcium   Date Value Ref Range Status  05/18/2024 8.8 (L) 8.9 - 10.3 mg/dL Final   Calcium , Total  Date Value Ref Range Status  10/27/2014 8.1 (L) mg/dL Final    Comment:    1.0-89.6 NOTE: New Reference Range  09/15/14          Failed - Cl in normal range and within 180 days    Chloride  Date Value Ref Range Status  05/18/2024 95 (L) 98 - 111 mmol/L Final  10/27/2014 109 mmol/L Final    Comment:    101-111 NOTE: New Reference Range  09/15/14          Passed - K in normal range and within 180 days    Potassium  Date Value Ref Range Status  05/18/2024 3.8 3.5 - 5.1 mmol/L Final  10/27/2014 3.6 mmol/L Final    Comment:    3.5-5.1 NOTE: New Reference Range  09/15/14          Passed - Na in normal range and within 180 days    Sodium  Date Value Ref Range Status  05/18/2024 140 135 - 145 mmol/L Final  05/07/2024 142 134 - 144 mmol/L Final  10/27/2014 137 mmol/L Final    Comment:    135-145 NOTE: New Reference Range  09/15/14          Passed - Cr in normal range and within 180 days    Creatinine  Date Value Ref Range Status  10/27/2014 0.91 mg/dL Final    Comment:    9.38-8.75 NOTE: New Reference Range  09/15/14    Creatinine, Ser  Date Value Ref Range Status  05/18/2024 1.21 0.61 - 1.24 mg/dL Final         Passed - Mg Level in normal range and within 180 days    Magnesium   Date Value Ref Range Status  05/14/2024 1.8 1.7 - 2.4 mg/dL Final    Comment:    Performed at [facility], [address] [place], [place] [address]         Passed - Last BP in normal range    BP Readings from Last  1 Encounters:  05/18/24 97/65         Passed - Valid encounter within last 6 months    Recent Outpatient Visits           2 weeks ago Acute on chronic systolic (congestive) heart failure (HCC)   [place] [facility] [place], Melanie T, NP   3 weeks ago SOB (shortness of breath)   [facility] Herold Hadassah SQUIBB, MD   5 months ago Dementia without behavioral disturbance (HCC)   [place] [facility] [place], Jolene T, NP   9 months ago Chronic systolic CHF (congestive heart failure) (HCC)   [facility] [place], Jolene T, NP               metoprolol  succinate (TOPROL -XL) 25 MG 24 hr tablet 90 tablet 4    Sig: Take 1 tablet (25 mg total) by mouth  daily.     Cardiovascular:  Beta Blockers Passed - 05/30/2024  9:48 AM      Passed - Last BP in normal range    BP Readings from Last 1 Encounters:  05/18/24 97/65         Passed - Last Heart Rate in normal range    Pulse Readings from Last 1 Encounters:  05/18/24 81         Passed - Valid encounter within last 6 months    Recent Outpatient Visits           2 weeks ago Acute on chronic systolic (congestive) heart failure (HCC)   [facility] [place], [place] T, NP   3 weeks ago SOB (shortness of breath)   [place] [facility] Herold Hadassah SQUIBB, MD   5 months ago Dementia without behavioral disturbance (HCC)   [facility] [place], [place] T, NP   9 months ago Chronic systolic CHF (congestive heart failure) (HCC)   [place] [facility] [place], [place] T, NP              Refused Prescriptions Disp Refills   lisinopril  (ZESTRIL ) 5 MG tablet 90 tablet 4    Sig: Take 1 tablet (5 mg total) by mouth daily.     Cardiovascular:  ACE Inhibitors Passed - 05/30/2024  9:48 AM      Passed - Cr in normal range and within 180 days    Creatinine  Date Value Ref Range Status   10/27/2014 0.91 mg/dL Final    Comment:    9.38-8.75 NOTE: New Reference Range  09/15/14    Creatinine, Ser  Date Value Ref Range Status  05/18/2024 1.21 0.61 - 1.24 mg/dL Final         Passed - K in normal range and within 180 days    Potassium  Date Value Ref Range Status  05/18/2024 3.8 3.5 - 5.1 mmol/L Final  10/27/2014 3.6 mmol/L Final    Comment:    3.5-5.1 NOTE: New Reference Range  09/15/14          Passed - Patient is not pregnant      Passed - Last BP in normal range    BP Readings from Last 1 Encounters:  05/18/24 97/65         Passed - Valid encounter within last 6 months    Recent Outpatient Visits           2 weeks ago Acute on chronic systolic (congestive) heart failure (HCC)   [facility] [place], Melanie T, NP   3 weeks ago SOB (shortness of breath)   [place] [facility] Herold Hadassah SQUIBB, MD   5 months ago Dementia without behavioral disturbance [facility])   [place] [facility] [place], Jolene T, NP   9 months ago Chronic systolic CHF (congestive heart failure) (HCC)    [facility] [place], Melanie DASEN, NP

## 2024-06-02 ENCOUNTER — Other Ambulatory Visit (HOSPITAL_COMMUNITY): Payer: Self-pay

## 2024-06-02 ENCOUNTER — Other Ambulatory Visit: Payer: Self-pay

## 2024-06-02 MED ORDER — FUROSEMIDE 40 MG PO TABS
40.0000 mg | ORAL_TABLET | Freq: Every day | ORAL | 0 refills | Status: AC
  Filled 2024-06-02: qty 30, 30d supply, fill #0
  Filled 2024-07-01: qty 30, 30d supply, fill #1
  Filled 2024-07-30: qty 30, 30d supply, fill #2

## 2024-06-02 MED ORDER — DAPAGLIFLOZIN PROPANEDIOL 10 MG PO TABS
10.0000 mg | ORAL_TABLET | Freq: Every day | ORAL | 0 refills | Status: AC
  Filled 2024-06-02: qty 90, 90d supply, fill #0
  Filled 2024-07-01: qty 30, 30d supply, fill #0
  Filled 2024-07-30: qty 30, 30d supply, fill #1

## 2024-06-02 MED ORDER — SPIRONOLACTONE 25 MG PO TABS
25.0000 mg | ORAL_TABLET | Freq: Every day | ORAL | 0 refills | Status: AC
  Filled 2024-06-02 – 2024-07-01 (×2): qty 90, 90d supply, fill #0

## 2024-06-02 MED ORDER — METOPROLOL SUCCINATE ER 25 MG PO TB24
12.5000 mg | ORAL_TABLET | Freq: Every day | ORAL | 0 refills | Status: AC
  Filled 2024-06-02 – 2024-06-04 (×2): qty 45, 90d supply, fill #0

## 2024-06-02 MED ORDER — SACUBITRIL-VALSARTAN 24-26 MG PO TABS
1.0000 | ORAL_TABLET | Freq: Two times a day (BID) | ORAL | 0 refills | Status: AC
  Filled 2024-06-02: qty 60, 30d supply, fill #0
  Filled 2024-07-01: qty 60, 30d supply, fill #1
  Filled 2024-07-30: qty 60, 30d supply, fill #2

## 2024-06-02 NOTE — Addendum Note (Signed)
 Addended by: DEANNA ROSELLA A on: 06/02/2024 01:29 PM   Modules accepted: Orders

## 2024-06-03 ENCOUNTER — Other Ambulatory Visit: Payer: Self-pay

## 2024-06-04 ENCOUNTER — Other Ambulatory Visit (HOSPITAL_COMMUNITY): Payer: Self-pay

## 2024-06-04 ENCOUNTER — Telehealth: Payer: Self-pay

## 2024-06-04 ENCOUNTER — Other Ambulatory Visit (HOSPITAL_BASED_OUTPATIENT_CLINIC_OR_DEPARTMENT_OTHER): Payer: Self-pay

## 2024-06-04 ENCOUNTER — Other Ambulatory Visit: Payer: Self-pay

## 2024-06-04 MED ORDER — SACUBITRIL-VALSARTAN 24-26 MG PO TABS
1.0000 | ORAL_TABLET | Freq: Two times a day (BID) | ORAL | 1 refills | Status: AC
  Filled 2024-06-04: qty 60, 30d supply, fill #0

## 2024-06-04 MED ORDER — FUROSEMIDE 40 MG PO TABS
40.0000 mg | ORAL_TABLET | Freq: Every day | ORAL | 1 refills | Status: AC
  Filled 2024-06-04: qty 30, 30d supply, fill #0

## 2024-06-04 NOTE — Progress Notes (Signed)
   06/04/2024  Patient ID: Blake [name], male   DOB: 1955/09/14, 68 y.o.   MRN: [id number]  Outreach attempt to follow-up with patient's sister, [name], in regard to medication adherence.  [facility] shipped new prescription for generic Entresto  along with new dose of metoprolol  along with refill of furosemide  this week.  I was not able to reach [name], nor leave a voicemail (voicemail full) to verify these have been received.  Patient should currently have all maintenance medications based on these fills and last fill dates for everything else.  I will continue to try to check in with them monthly on adherence.  [name], PharmD, DPLA

## 2024-06-25 ENCOUNTER — Other Ambulatory Visit: Payer: Self-pay

## 2024-07-01 ENCOUNTER — Other Ambulatory Visit (HOSPITAL_COMMUNITY): Payer: Self-pay

## 2024-07-01 ENCOUNTER — Telehealth: Payer: Self-pay

## 2024-07-01 ENCOUNTER — Other Ambulatory Visit: Payer: Self-pay

## 2024-07-01 ENCOUNTER — Telehealth (HOSPITAL_COMMUNITY): Payer: Self-pay

## 2024-07-01 NOTE — Progress Notes (Signed)
" ° °  07/01/2024 Name: Alim Garin MRN: [id number] DOB: 01-26-1956  Subjective/Objective: Outreach attempt to follow-up with patient's sister, [name], in regard to medication access/adherence.  I was not able to reach [place], nor was I able to leave a voicemail.  Care Team: Primary Care Provider: Cannady, Jolene T, NP -no follow-up currently scheduled  Medication Management: -Current adherence strategy: receives medications in adherence packaging via home delivery from [facility] outpatient pharmacy, and his sister, [name], gives his medications to him  Recent fill dates:  Albuterol  HFA- [facility] day [facility] ASA 81mg - 30 day supply filled 10/29 at [facility]- refills on [facility] Atorvastatin  40mg - 30 day supply in process at [facility] currently Cholecalciferol  50k units- 84 day supply filled [facility] 11/18- refills on file at [facility] B12 - 30 day supply in process at [facility] currently Farxiga  10mg - 30 day supply in process at [facility] currently Furosemide  40mg - 30 day supply in process at [facility] currently Metoprolol  XL 12.5mg - 90 day supply filled 11/[facility] Nitrostat - 25 tabs filled [facility] 2/26 (prn ned)- refills on file at [facility] Omeprazole  20mg - 30 day supply in process at [facility] currently Rivastigmaine 3mg - 30 day supply in process at [facility] currently Entresto  24/26mg - 30 day supply in process at [facility] currently Spironolactone  25mg - 30 day supply [facility] Trazodone  50mg - 30 day supply in process at [facility] currently Anoro- 30 day [facility]  Objective:  Lab Results  Component Value Date   CREATININE 1.21 05/18/2024   BUN 36 (H) 05/18/2024   NA 140 05/18/2024   K 3.8 05/18/2024   CL 95 (L) 05/18/2024   CO2 35 (H) 05/18/2024   Lab Results  Component Value Date   CHOL 117 12/19/2023   HDL 43 12/19/2023   LDLCALC 60 12/19/2023   TRIG 63 12/19/2023   CHOLHDL 3.8 06/08/2019   Assessment/Plan:   Medication Management: -Coordinating with [facility] to add  spironolactone  and ASA to his current medication fills that are in process to be packaged and delivered.  I will also request (again) for them to transfer over Anoro and albuterol  HFA- Anoro is due to be filled at this time.  Follow-up:  I will attempt to contact patient/sister again in 4 weeks, as he is due for a follow-up with PCP and to check in again on adherence  [name], PharmD, DPLA    "

## 2024-07-02 ENCOUNTER — Other Ambulatory Visit: Payer: Self-pay

## 2024-07-04 ENCOUNTER — Other Ambulatory Visit (HOSPITAL_COMMUNITY): Payer: Self-pay

## 2024-07-04 ENCOUNTER — Other Ambulatory Visit: Payer: Self-pay

## 2024-07-21 NOTE — Progress Notes (Unsigned)
" ° °  07/22/24 Name: Blake [name] MRN: [id number] DOB: [birth year]  Subjective/Objective: Outreach attempt to follow-up with patient's sister, [name], in regard to medication access/adherence.  I was not able to reach [name], nor was I able to leave a voicemail (number currently not in service)  Care Team: Primary Care Provider: Cannady, Jolene T, NP -no follow-up currently scheduled  Medication Management: -Current adherence strategy: receives medications in adherence packaging via home delivery from [facility] outpatient pharmacy, and his sister, [name], gives his medications to him (per previous telephone conversation with her)  Recent fill dates:  Albuterol  HFA- PRN med filled/delivered 12/23 by [facility] 81mg - 30 day supply filled 10/29 at [facility]- refills on file at [facility] Atorvastatin  40mg - 30 day supply filled/delivered 12/23 by [facility] Cholecalciferol  50k units- 84 day supply filled at [facility] 11/18- refills on file at [facility] B12 - 30 day supply filled/delivered 12/23 by [facility] Farxiga  10mg - 30 day supply filled/delivered 12/23 by [facility] Furosemide  40mg - 30 day supply filled/delivered 12/23 by [facility] Metoprolol  XL 12.5mg - 90 day supply filled 11/26 at [facility] Nitrostat - 25 tabs filled at [facility] 2/26 (prn ned)- refills on file at [facility] Omeprazole  20mg - 30 day supply filled/delivered 12/23 by [facility] Rivastigmaine 3mg - 30 day supply filled/delivered 12/23 by [facility] Entresto  24/26mg - 30 day supply filled/delivered 12/23 by [facility] Spironolactone  25mg - 90 day supply filled/delivered 12/23 by [facility] Trazodone  50mg - 30 day supply filled/delivered 12/23 by [facility] Anoro- 30 day supply filled/delivered 12/23 by [facility]  Objective:  Lab Results  Component Value Date   CREATININE 1.21 05/18/2024   BUN 36 (H) 05/18/2024   NA 140 05/18/2024   K 3.8 05/18/2024   CL 95 (L) 05/18/2024   CO2 35 (H) 05/18/2024   Lab Results  Component Value Date   CHOL 117 12/19/2023   HDL 43 12/19/2023   LDLCALC 60 12/19/2023   TRIG 63  12/19/2023   CHOLHDL 3.8 06/08/2019   Assessment/Plan:   Medication Management: -Based on fill history, patient is adherent to current medication regimen; but this cannot be verified since I was not able to reach the patient/his sister -Patient will be due for PCP follow-up in another month  Follow-up:  I will attempt to contact patient/sister again in 4-6 weeks to follow-up on medication adherence and scheduling a follow-up with PCP  [name], PharmD, DPLA    "

## 2024-07-22 ENCOUNTER — Other Ambulatory Visit

## 2024-07-22 ENCOUNTER — Other Ambulatory Visit: Payer: Self-pay

## 2024-07-22 DIAGNOSIS — I10 Essential (primary) hypertension: Secondary | ICD-10-CM

## 2024-07-28 ENCOUNTER — Other Ambulatory Visit: Payer: Self-pay

## 2024-07-30 ENCOUNTER — Other Ambulatory Visit: Payer: Self-pay

## 2024-07-31 ENCOUNTER — Other Ambulatory Visit: Payer: Self-pay

## 2024-07-31 ENCOUNTER — Other Ambulatory Visit (HOSPITAL_COMMUNITY): Payer: Self-pay

## 2024-08-29 ENCOUNTER — Other Ambulatory Visit: Payer: Self-pay
